# Patient Record
Sex: Male | Born: 1975 | Race: White | Hispanic: No | Marital: Married | Smoking: Never smoker
Health system: Southern US, Community
[De-identification: ages and names within clinical notes are randomized; demographics above are authoritative.]

## PROBLEM LIST (undated history)

## (undated) DIAGNOSIS — K219 Gastro-esophageal reflux disease without esophagitis: Secondary | ICD-10-CM

## (undated) DIAGNOSIS — I509 Heart failure, unspecified: Secondary | ICD-10-CM

## (undated) DIAGNOSIS — J189 Pneumonia, unspecified organism: Secondary | ICD-10-CM

## (undated) DIAGNOSIS — A4902 Methicillin resistant Staphylococcus aureus infection, unspecified site: Secondary | ICD-10-CM

## (undated) DIAGNOSIS — I10 Essential (primary) hypertension: Secondary | ICD-10-CM

## (undated) DIAGNOSIS — F419 Anxiety disorder, unspecified: Secondary | ICD-10-CM

## (undated) DIAGNOSIS — N189 Chronic kidney disease, unspecified: Secondary | ICD-10-CM

## (undated) HISTORY — PX: APPENDECTOMY: SHX54

## (undated) HISTORY — DX: Heart failure, unspecified: I50.9

## (undated) HISTORY — PX: COLONOSCOPY: SHX174

## (undated) HISTORY — PX: HEART TRANSPLANT: SHX268

## (undated) HISTORY — DX: Anxiety disorder, unspecified: F41.9

---

## 2004-08-31 ENCOUNTER — Emergency Department (HOSPITAL_COMMUNITY): Admission: EM | Admit: 2004-08-31 | Discharge: 2004-09-01 | Payer: Self-pay | Admitting: Emergency Medicine

## 2004-08-31 IMAGING — US US ABDOMEN COMPLETE
1 series · 14 of 25 positions shown · non-contrast
Comparison: none

CLINICAL DATA: Rt sided abdominal pain.
 ABDOMINAL ULTRASOUND
 There is no evidence of gallstones or gallbladder wall thickening.  There is no evidence of biliary ductal dilatation with the common bile duct measuring approximately 4 mm.  The liver is normal in echogenicity and no focal liver lesions are seen.  Visualization of the inferior vena cava and pancreas were limited due to overlying bowel gas.  
 There is no evidence of splenomegaly.  Both kidneys are normal in size and appearance and there is no evidence of hydronephrosis.  The abdominal aorta was also obscured by overlying bowel gas. 
 IMPRESSION
 No sonographic abnormality detected.  No evidence of gallstones or biliary dilatation.

[Series 1: unknown · 0.34mm/px · 14 of 56 slices shown]
[im 1/56]
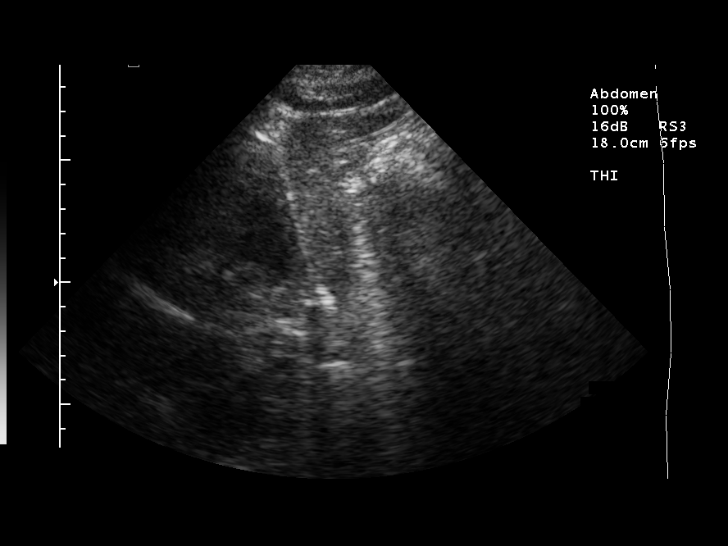
[im 5/56]
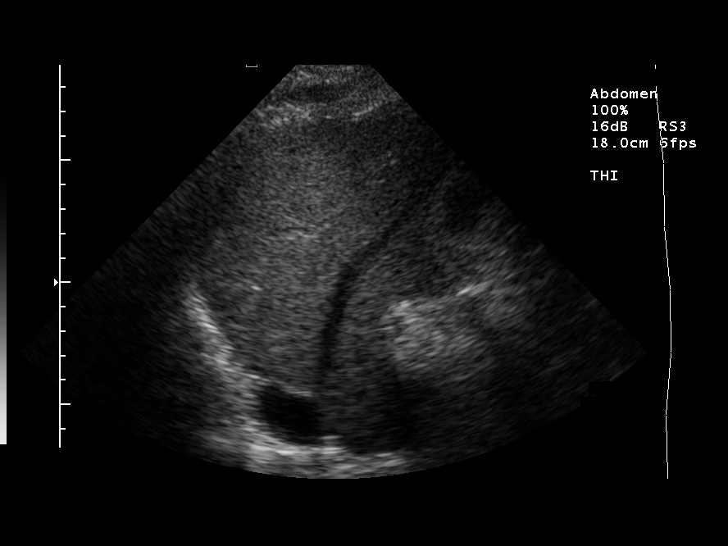
[im 10/56]
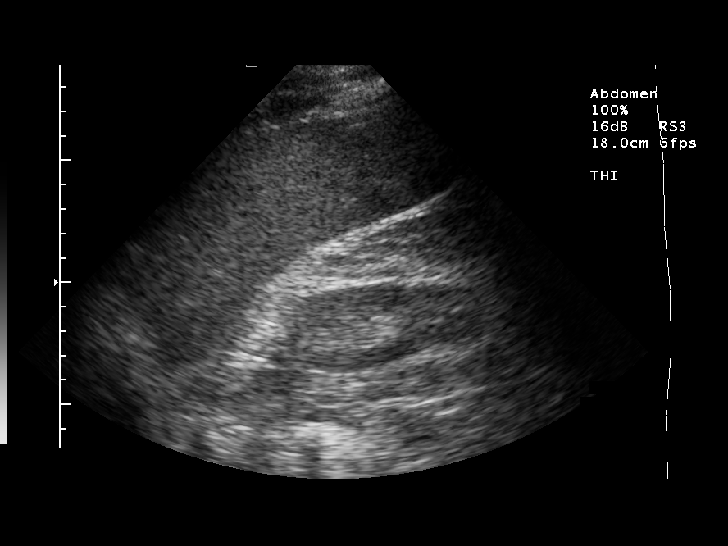
[im 14/56]
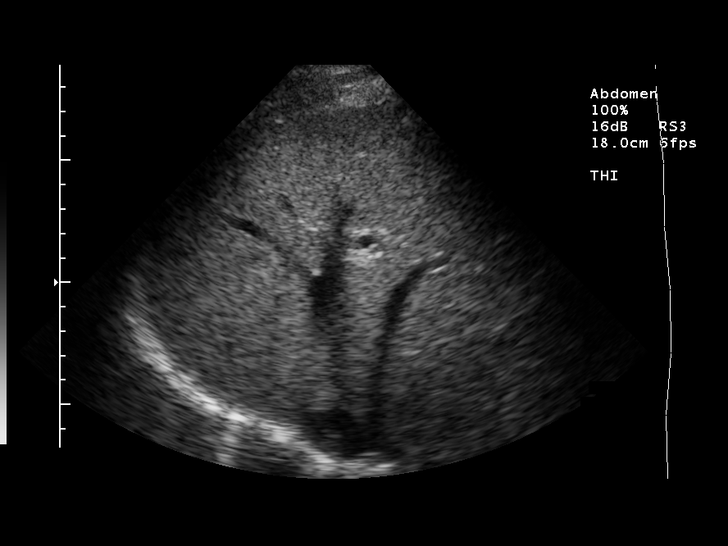
[im 19/56]
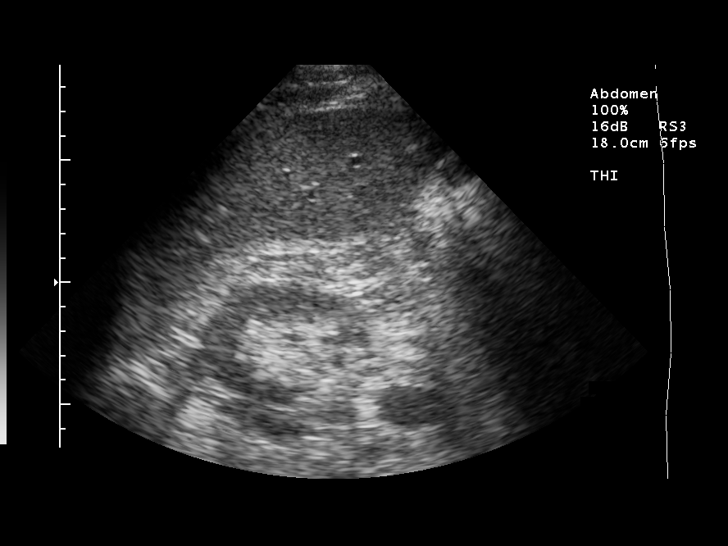
[im 21/56]
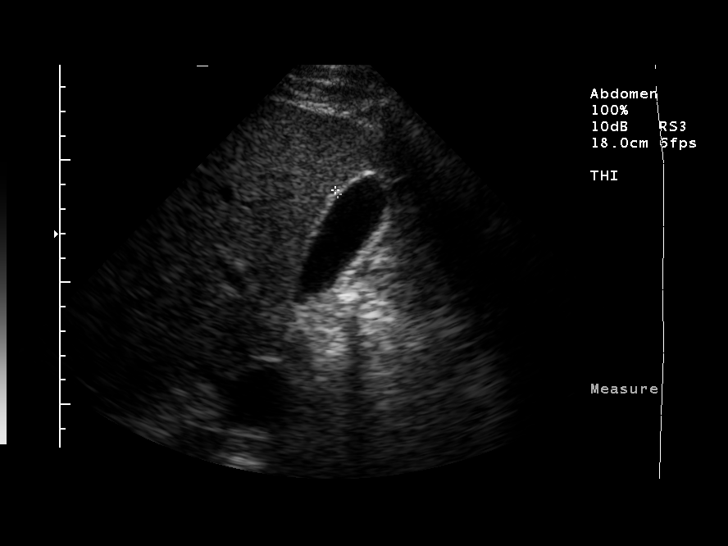
[im 26/56]
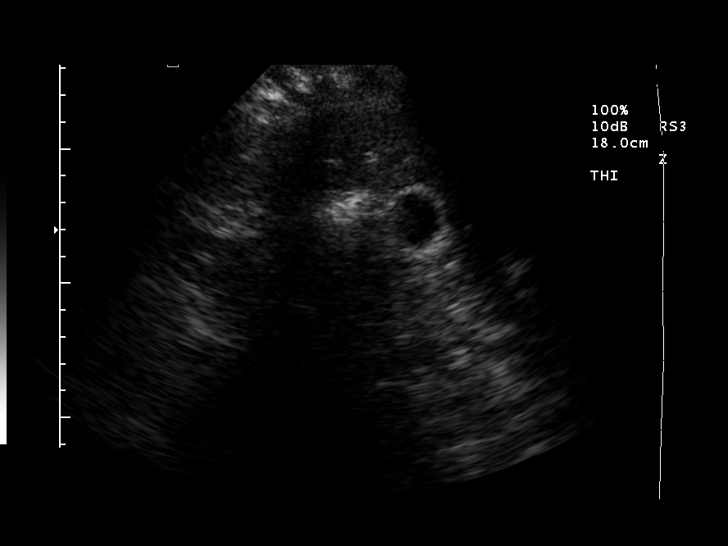
[im 30/56]
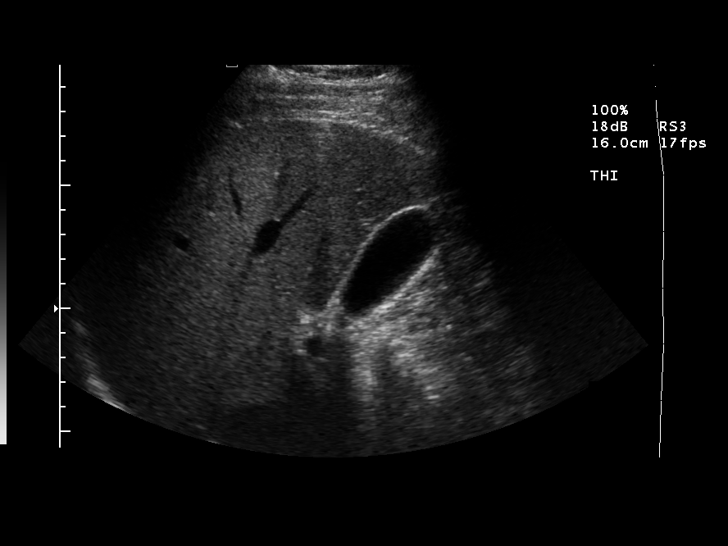
[im 35/56]
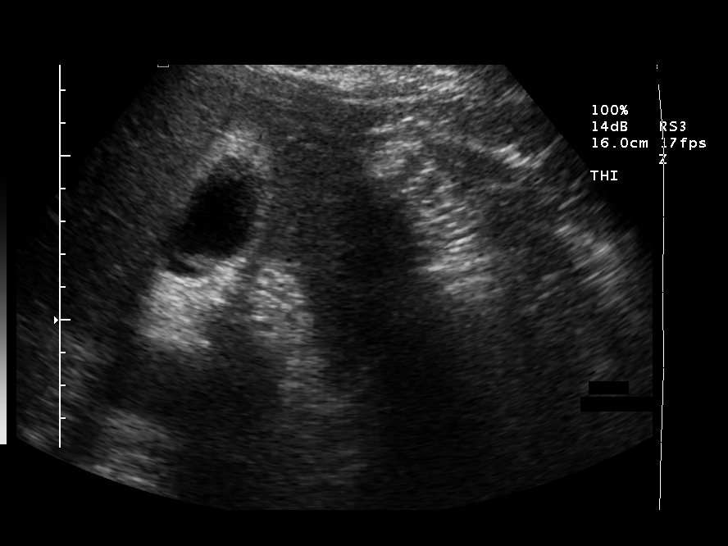
[im 37/56]
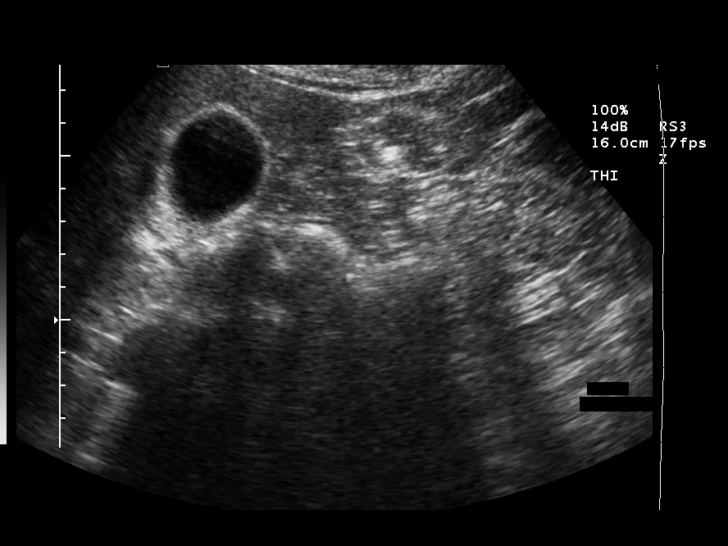
[im 42/56]
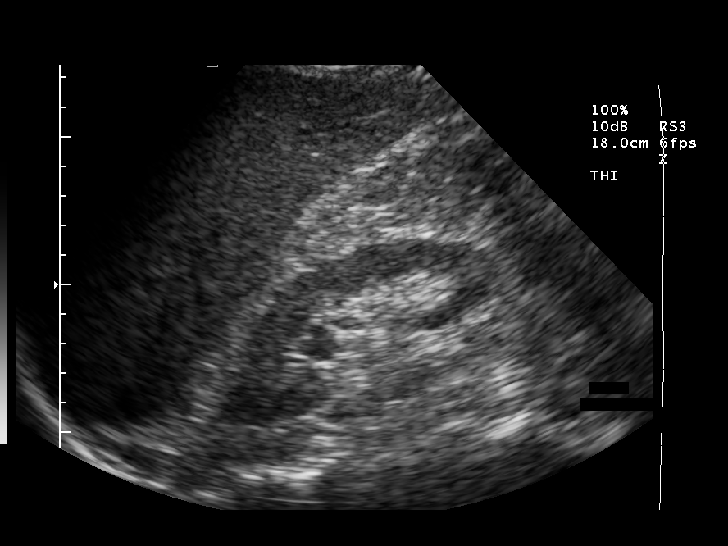
[im 46/56]
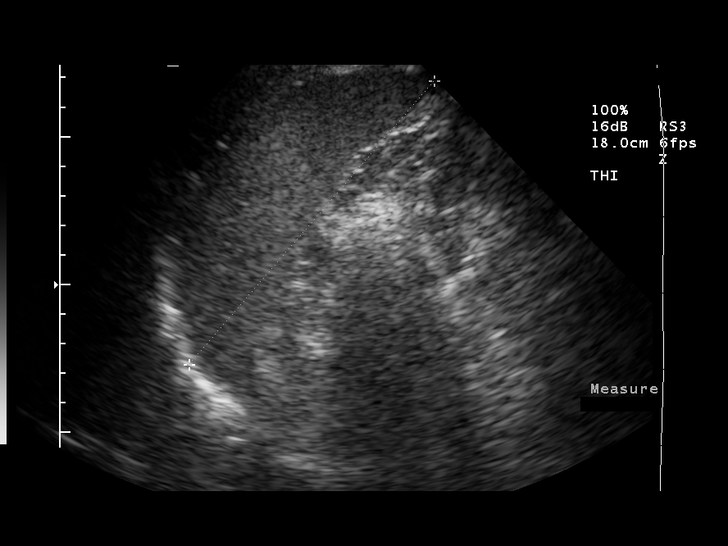
[im 51/56]
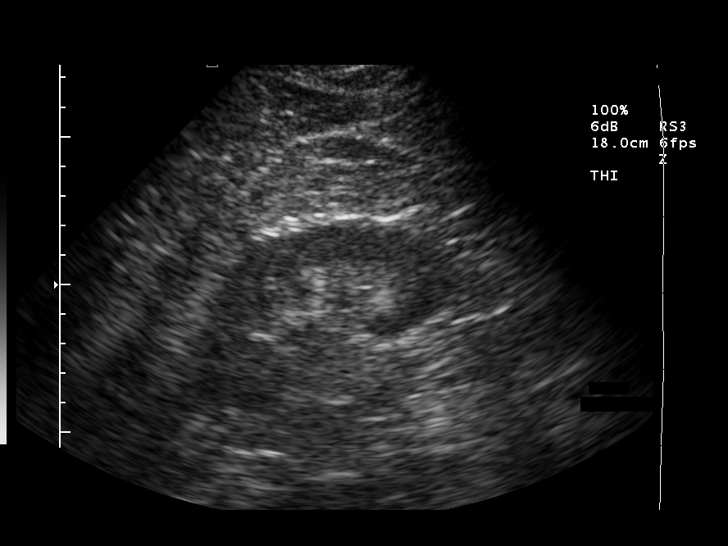
[im 56/56]
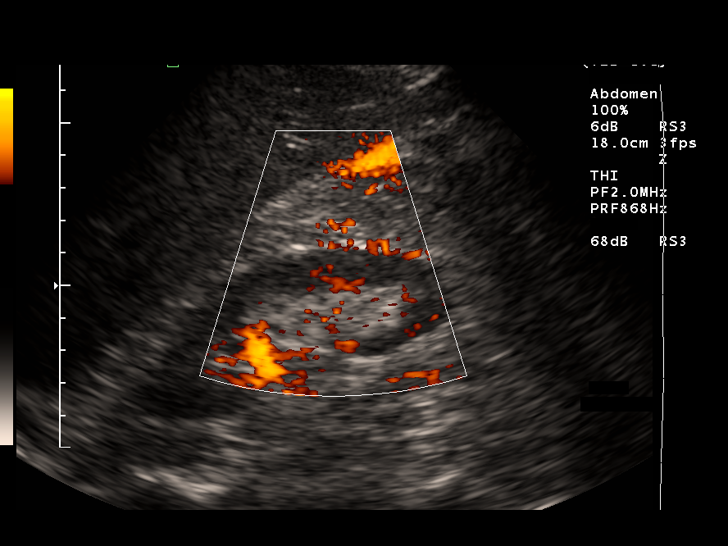

[14 of 25 positions shown; findings below may reference images not displayed]

## 2004-08-31 IMAGING — CR DG CHEST 2V
2 series · 2 of 2 positions shown · non-contrast
Comparison: none

CLINICAL DATA: Chest pain.  Shortness of breath.
 TWO VIEW CHEST, [DATE]
 There are no prior studies available for comparison.  There is cardiomegaly present.  There are perihilar and bibasilar interstitial edematous changes and there is accentuation of the minor fissure.  There is an implantable defibrillator present with a right ventricular lead, which appears in satisfactory position.  
 IMPRESSION
 Cardiomegaly with mild interstitial pulmonary edematous changes.

[view not recorded (1 of 2)]
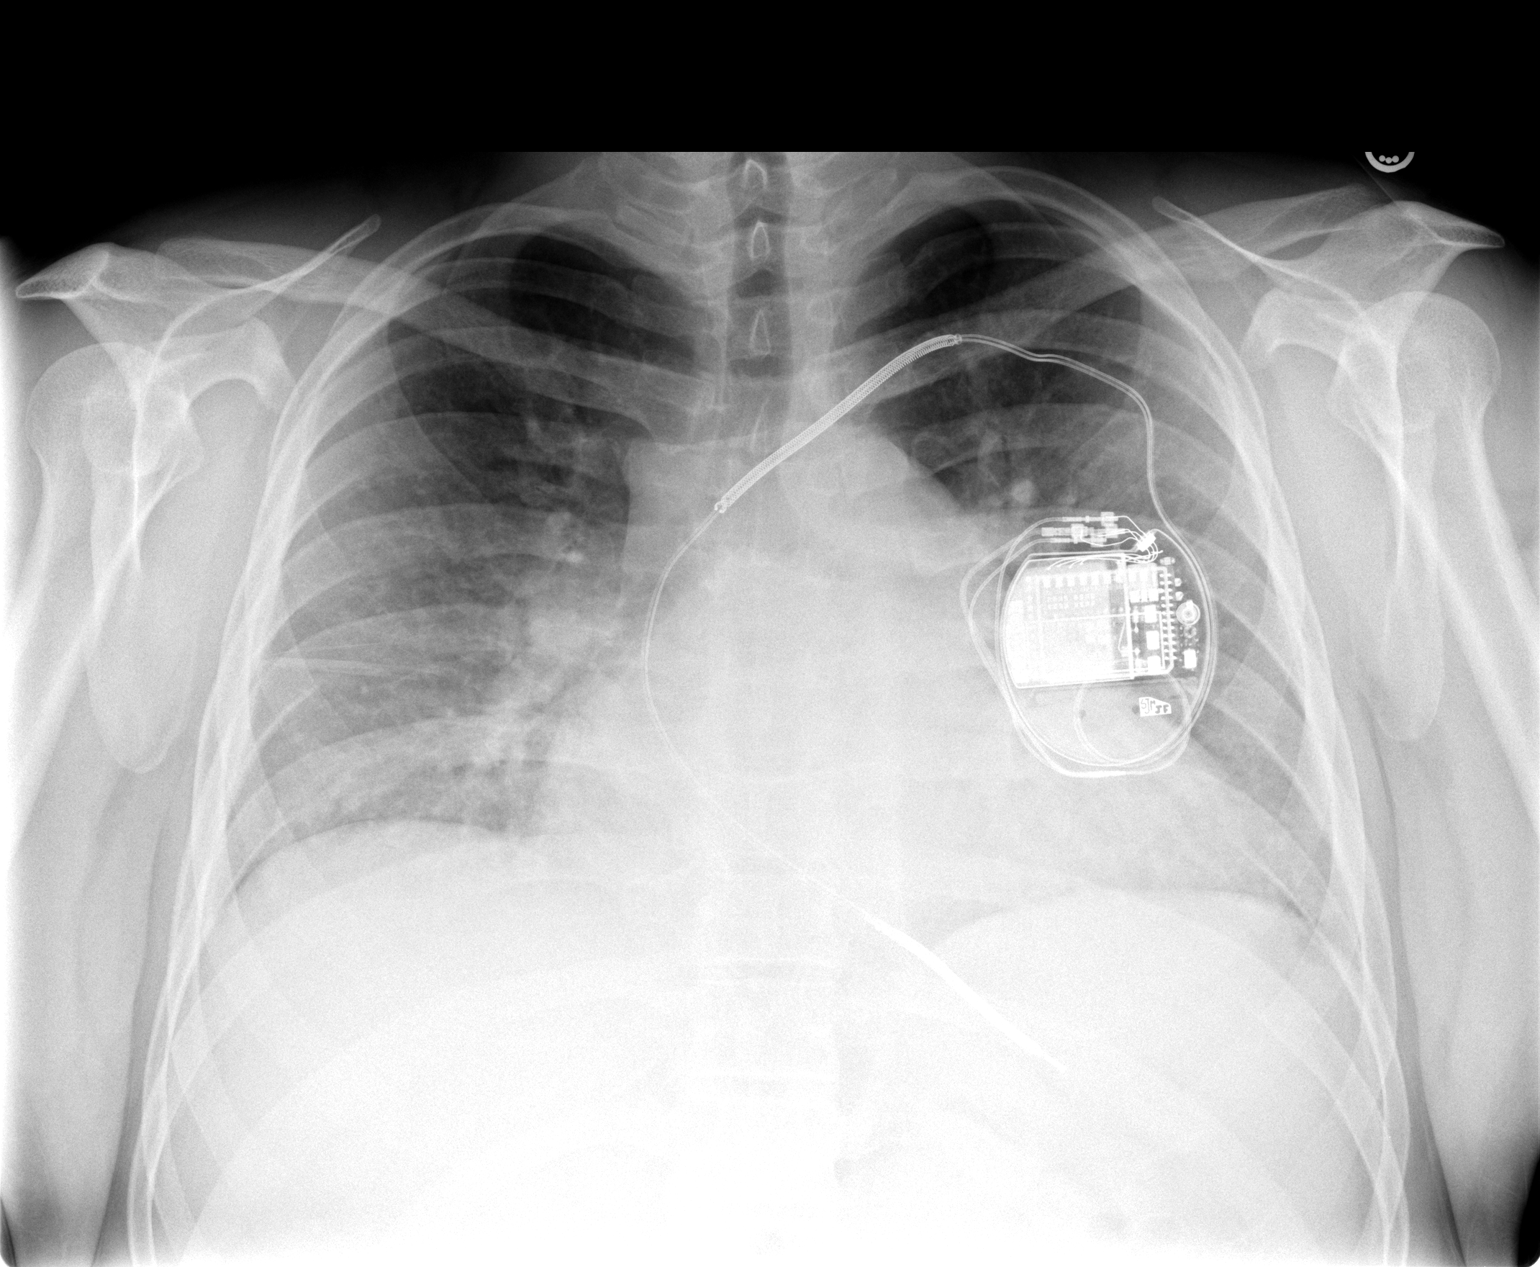

[view not recorded (2 of 2)]
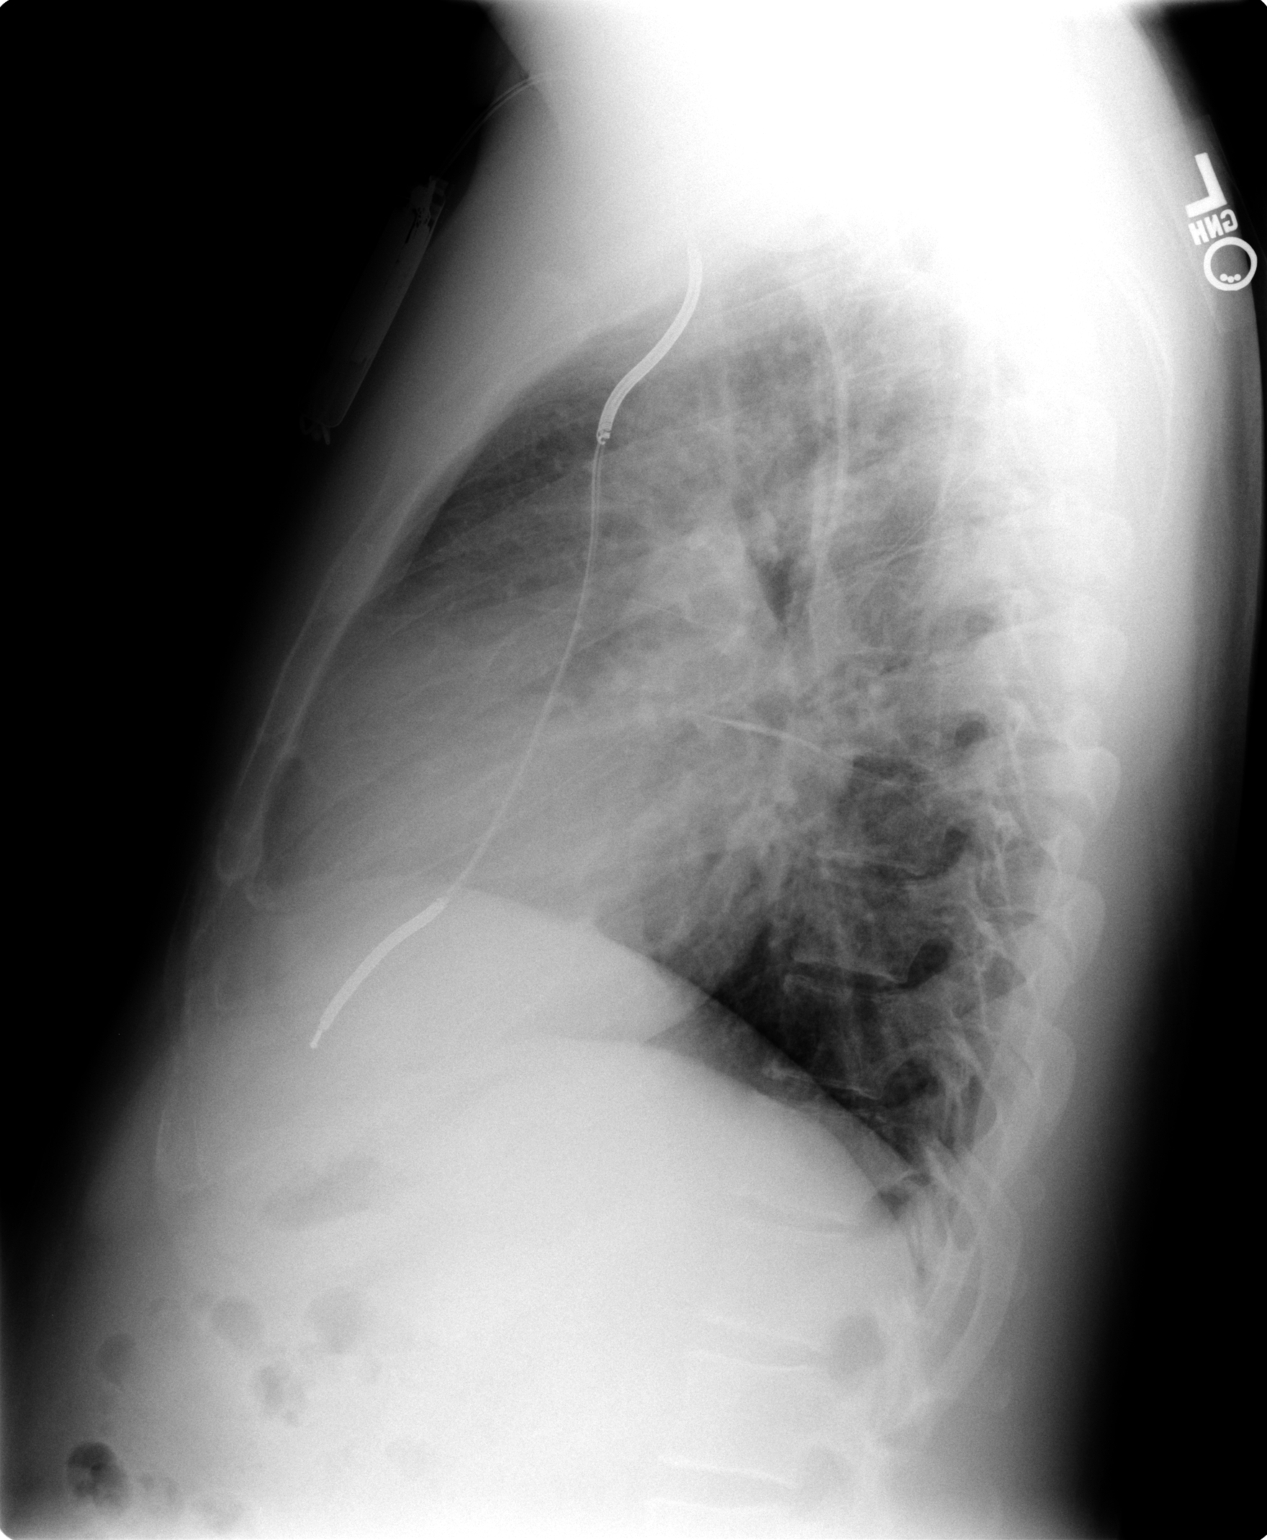

[2 of 2 positions shown; findings below may reference images not displayed]

## 2006-02-10 ENCOUNTER — Encounter: Admission: RE | Admit: 2006-02-10 | Discharge: 2006-02-10 | Payer: Self-pay | Admitting: Sports Medicine

## 2006-02-10 IMAGING — CT CT L SPINE W/O CM
3 series · 16 of 33 positions shown, 19 images · IV contrast (agent unspecified)
Comparison: none

CLINICAL DATA: Chronic low back pain.  No injury. 
LUMBAR SPINE CT WITHOUT CONTRAST:
TECHNIQUE: Multidetector CT imaging of the lumbar spine was performed.  Sagittal and coronal CT image reconstructions were also generated.

[Series 4: recon 3: l-spine helical · axial · 0.27mm/px · z∈[-97,+62]mm · 8 of 151 slices shown, 10 images]
[im 12/151  soft-tissue]
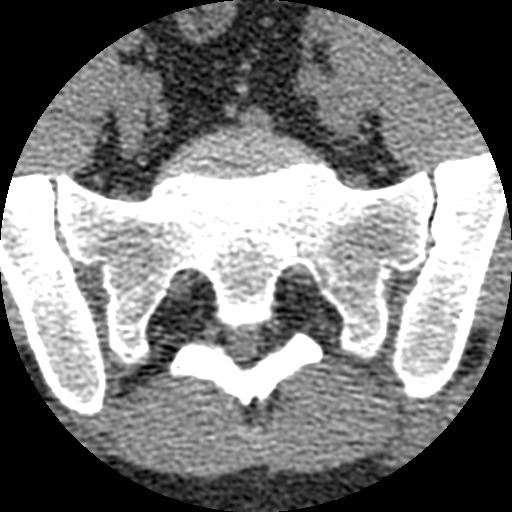
[im 12/151  bone]
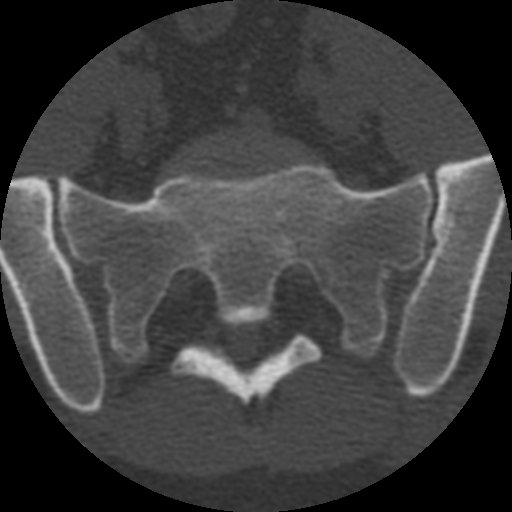
[im 35/151  bone]
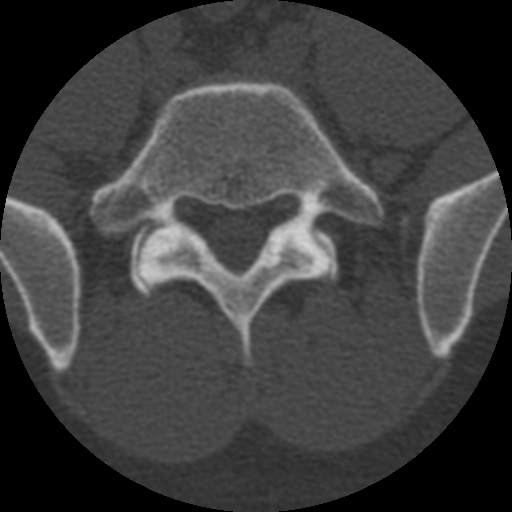
[im 47/151  bone]
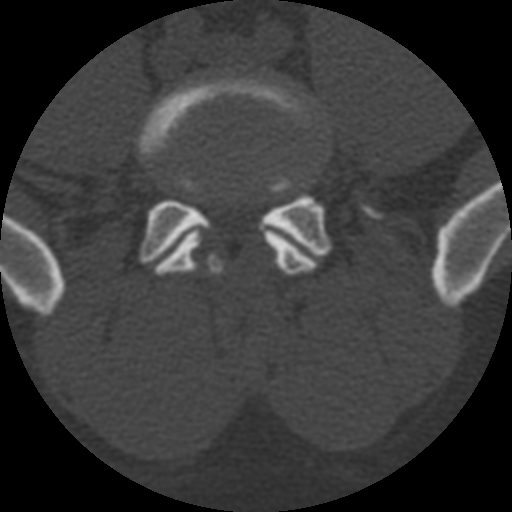
[im 70/151  bone]
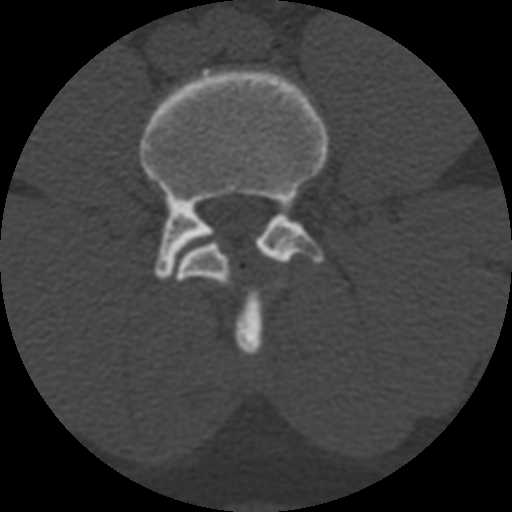
[im 81/151  soft-tissue]
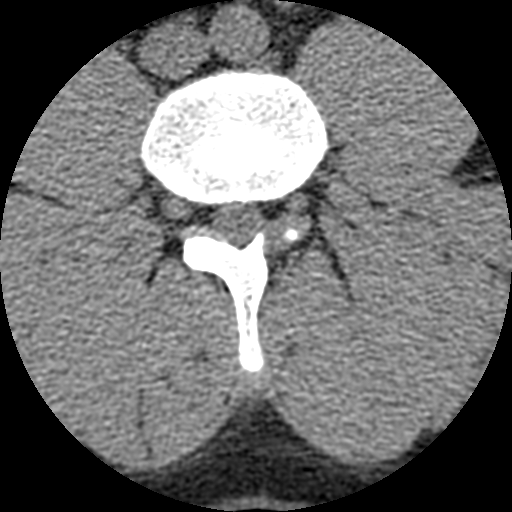
[im 81/151  bone]
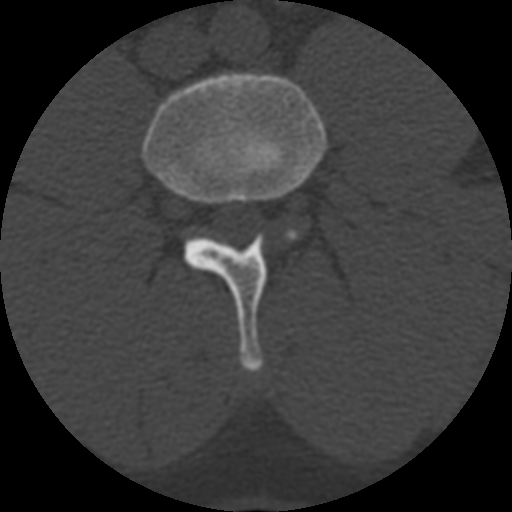
[im 104/151  bone]
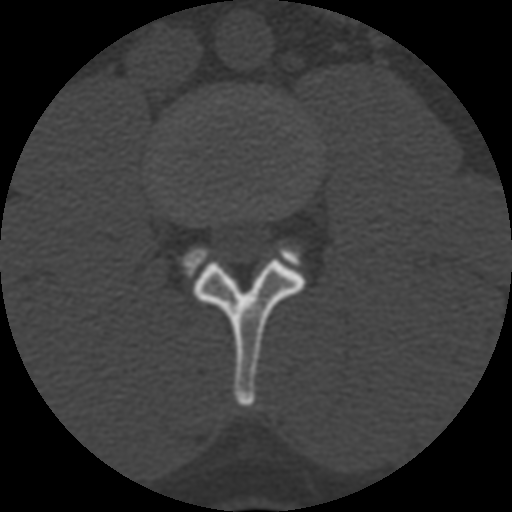
[im 116/151  bone]
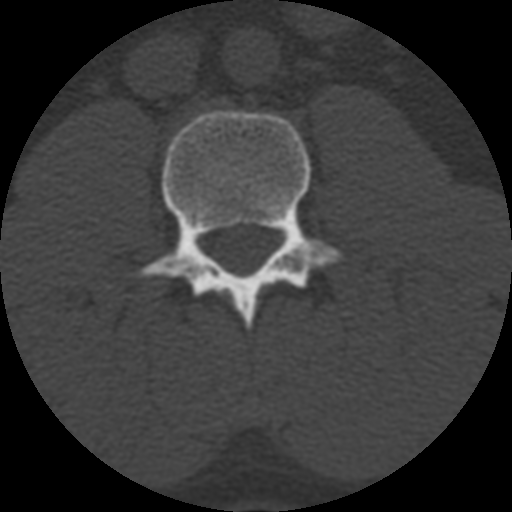
[im 139/151  bone]
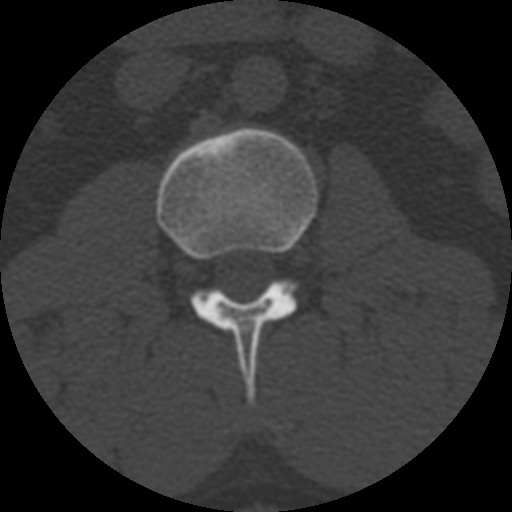

[Series 400: reformatted · sagittal · 0.38mm/px · 5 of 40 slices shown, 6 images (1 of 2)]
[im 14/40  bone]
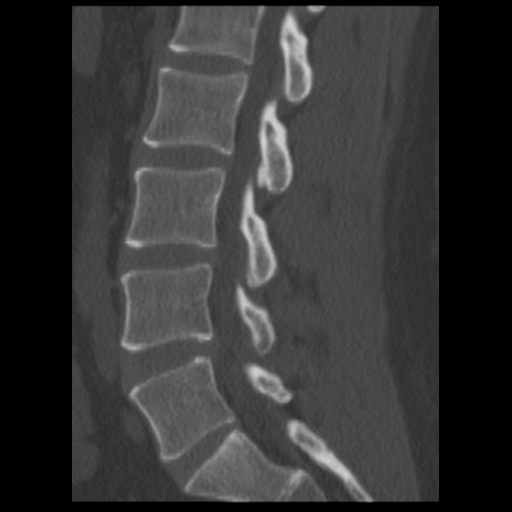
[im 17/40  bone]
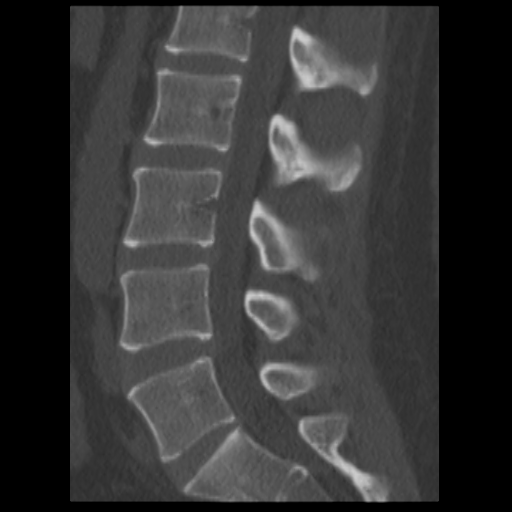
[im 20/40  soft-tissue]
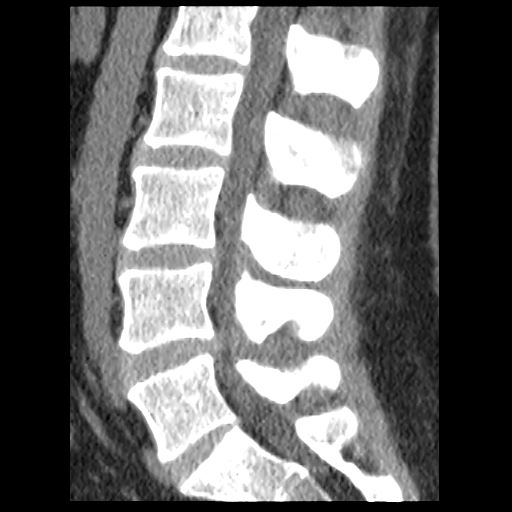
[im 20/40  bone]
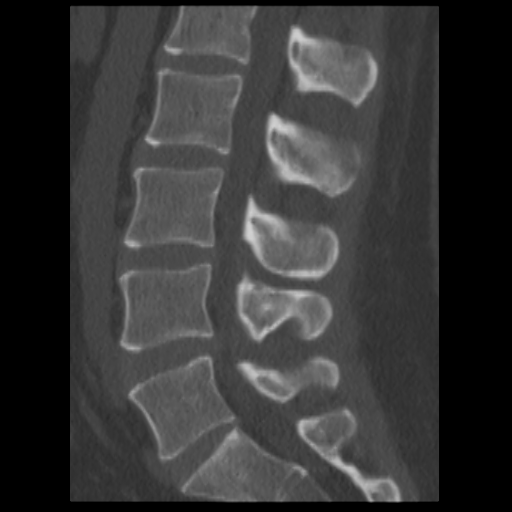
[im 23/40  bone]
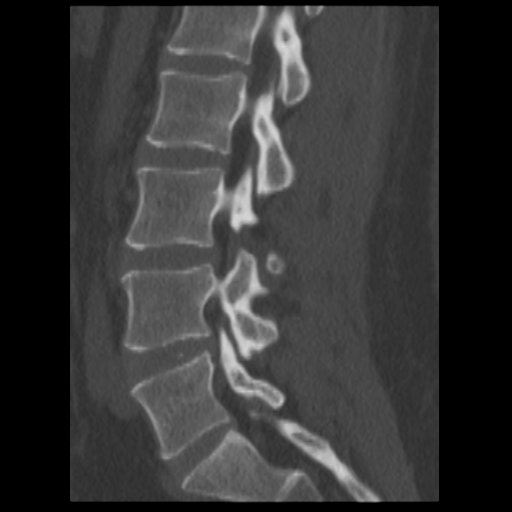
[im 27/40  bone]
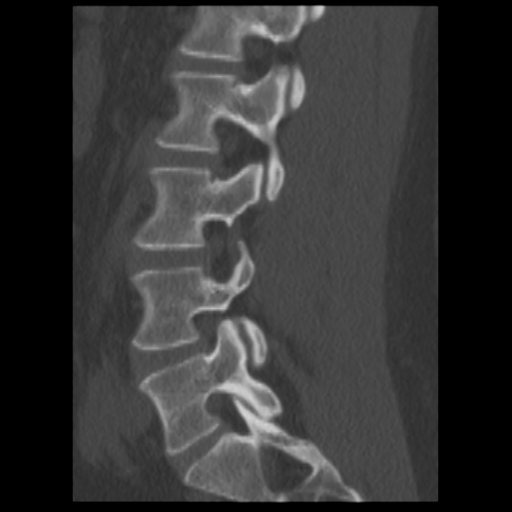

[Series 401: reformatted · coronal · 0.38mm/px · 3 of 40 slices shown (2 of 2)]
[im 8/40  bone]
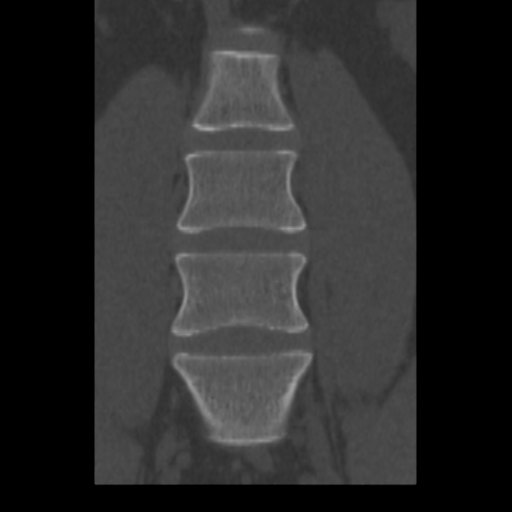
[im 16/40  bone]
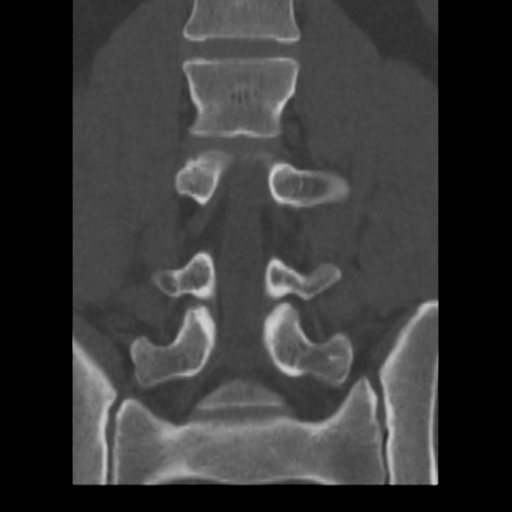
[im 24/40  bone]
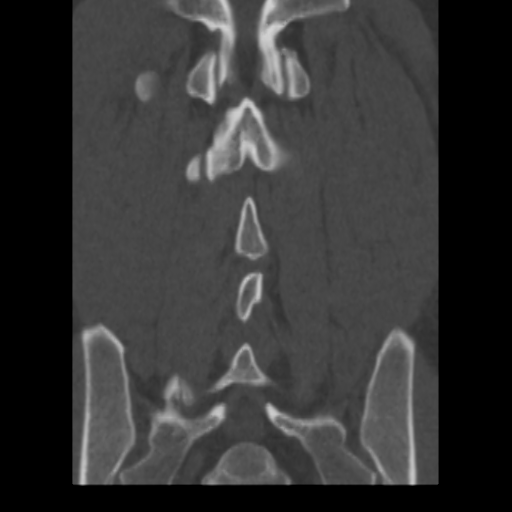

[16 of 33 positions shown; findings below may reference images not displayed]

FINDINGS: At L1-2 the thecal sac is well seen as are the exiting nerve roots with no abnormality. 
At L2-3 no abnormality is seen.
At L3-4 the annulus is minimally bulging but no significant abnormality is evident.  
At L4-5 there is moderate to severe central spinal stenosis which is multifactorial.  There is annular bulging as well as degenerative change of the facet joints and ligamentum flavum hypertrophy.  
At L5-S1 no significant abnormality is seen.  Normal alignment is maintained and intervertebral disc spaces are normal.
IMPRESSION: 1.  Moderate central and lateral recess stenosis at L4-5 which is multifactorial.
2.  Normal alignment with normal disc spaces.

## 2006-12-09 ENCOUNTER — Ambulatory Visit: Payer: Self-pay | Admitting: Gastroenterology

## 2006-12-14 DIAGNOSIS — Z941 Heart transplant status: Secondary | ICD-10-CM

## 2006-12-14 HISTORY — PX: OTHER SURGICAL HISTORY: SHX169

## 2006-12-14 HISTORY — DX: Heart transplant status: Z94.1

## 2007-01-19 ENCOUNTER — Ambulatory Visit: Payer: Self-pay | Admitting: Gastroenterology

## 2009-04-25 ENCOUNTER — Encounter: Payer: Self-pay | Admitting: Orthopedic Surgery

## 2009-05-14 ENCOUNTER — Encounter: Payer: Self-pay | Admitting: Orthopedic Surgery

## 2009-06-13 ENCOUNTER — Encounter: Payer: Self-pay | Admitting: Orthopedic Surgery

## 2011-11-24 DIAGNOSIS — Z941 Heart transplant status: Secondary | ICD-10-CM | POA: Insufficient documentation

## 2011-11-24 DIAGNOSIS — F429 Obsessive-compulsive disorder, unspecified: Secondary | ICD-10-CM | POA: Insufficient documentation

## 2011-11-24 DIAGNOSIS — I10 Essential (primary) hypertension: Secondary | ICD-10-CM | POA: Insufficient documentation

## 2012-12-27 DIAGNOSIS — E785 Hyperlipidemia, unspecified: Secondary | ICD-10-CM | POA: Insufficient documentation

## 2014-02-05 ENCOUNTER — Ambulatory Visit (HOSPITAL_COMMUNITY)
Admission: RE | Admit: 2014-02-05 | Discharge: 2014-02-05 | Disposition: A | Discharge status: Home / Self Care | Payer: 59 | Source: Ambulatory Visit | Attending: Orthopedic Surgery | Admitting: Orthopedic Surgery

## 2014-02-05 ENCOUNTER — Other Ambulatory Visit (HOSPITAL_COMMUNITY): Payer: Self-pay | Admitting: Orthopedic Surgery

## 2014-02-05 DIAGNOSIS — R52 Pain, unspecified: Secondary | ICD-10-CM

## 2014-02-05 DIAGNOSIS — Z1389 Encounter for screening for other disorder: Secondary | ICD-10-CM | POA: Insufficient documentation
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
## 2018-10-11 ENCOUNTER — Encounter (HOSPITAL_COMMUNITY): Payer: Self-pay | Admitting: Emergency Medicine

## 2018-10-11 ENCOUNTER — Emergency Department (HOSPITAL_COMMUNITY)
Admission: EM | Admit: 2018-10-11 | Discharge: 2018-10-11 | Disposition: A | Discharge status: Home / Self Care | Payer: No Typology Code available for payment source | Attending: Emergency Medicine | Admitting: Emergency Medicine

## 2018-10-11 ENCOUNTER — Emergency Department (HOSPITAL_COMMUNITY): Payer: No Typology Code available for payment source

## 2018-10-11 DIAGNOSIS — S81802A Unspecified open wound, left lower leg, initial encounter: Secondary | ICD-10-CM | POA: Diagnosis not present

## 2018-10-11 DIAGNOSIS — Y92410 Unspecified street and highway as the place of occurrence of the external cause: Secondary | ICD-10-CM | POA: Insufficient documentation

## 2018-10-11 DIAGNOSIS — Y990 Civilian activity done for income or pay: Secondary | ICD-10-CM | POA: Diagnosis not present

## 2018-10-11 DIAGNOSIS — M25511 Pain in right shoulder: Secondary | ICD-10-CM | POA: Diagnosis not present

## 2018-10-11 DIAGNOSIS — Y9389 Activity, other specified: Secondary | ICD-10-CM | POA: Insufficient documentation

## 2018-10-11 DIAGNOSIS — S8992XA Unspecified injury of left lower leg, initial encounter: Secondary | ICD-10-CM | POA: Diagnosis present
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
## 2018-10-11 NOTE — ED Notes (Signed)
Patient transported to X-ray 

## 2018-10-11 NOTE — ED Notes (Signed)
Family at bedside. 

## 2018-10-11 NOTE — ED Notes (Signed)
ED Provider at bedside. 
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
## 2019-01-03 DIAGNOSIS — H906 Mixed conductive and sensorineural hearing loss, bilateral: Secondary | ICD-10-CM | POA: Insufficient documentation

## 2019-09-28 DIAGNOSIS — H6123 Impacted cerumen, bilateral: Secondary | ICD-10-CM | POA: Insufficient documentation

## 2019-12-01 ENCOUNTER — Ambulatory Visit: Payer: Managed Care, Other (non HMO) | Attending: Internal Medicine

## 2019-12-01 DIAGNOSIS — Z20822 Contact with and (suspected) exposure to covid-19: Secondary | ICD-10-CM

## 2019-12-02 LAB — NOVEL CORONAVIRUS, NAA: SARS-CoV-2, NAA: NOT DETECTED

## 2020-04-01 ENCOUNTER — Encounter (HOSPITAL_COMMUNITY): Payer: Self-pay | Admitting: Emergency Medicine

## 2020-04-01 ENCOUNTER — Other Ambulatory Visit: Payer: Self-pay

## 2020-04-01 ENCOUNTER — Emergency Department (HOSPITAL_COMMUNITY)
Admission: EM | Admit: 2020-04-01 | Discharge: 2020-04-01 | Disposition: A | Discharge status: Home / Self Care | Payer: Managed Care, Other (non HMO) | Attending: Emergency Medicine | Admitting: Emergency Medicine

## 2020-04-01 ENCOUNTER — Emergency Department (HOSPITAL_COMMUNITY): Payer: Managed Care, Other (non HMO)

## 2020-04-01 DIAGNOSIS — R1033 Periumbilical pain: Secondary | ICD-10-CM | POA: Insufficient documentation

## 2020-04-01 DIAGNOSIS — R1013 Epigastric pain: Secondary | ICD-10-CM | POA: Insufficient documentation

## 2020-04-01 DIAGNOSIS — R112 Nausea with vomiting, unspecified: Secondary | ICD-10-CM | POA: Diagnosis present

## 2020-04-01 DIAGNOSIS — R109 Unspecified abdominal pain: Secondary | ICD-10-CM

## 2020-04-01 DIAGNOSIS — R197 Diarrhea, unspecified: Secondary | ICD-10-CM | POA: Diagnosis not present

## 2020-04-01 LAB — COMPREHENSIVE METABOLIC PANEL
ALT: 25 U/L (ref 0–44)
AST: 33 U/L (ref 15–41)
Albumin: 4.2 g/dL (ref 3.5–5.0)
Alkaline Phosphatase: 59 U/L (ref 38–126)
Anion gap: 13 (ref 5–15)
BUN: 18 mg/dL (ref 6–20)
CO2: 22 mmol/L (ref 22–32)
Calcium: 9.9 mg/dL (ref 8.9–10.3)
Chloride: 104 mmol/L (ref 98–111)
Creatinine, Ser: 1.26 mg/dL — ABNORMAL HIGH (ref 0.61–1.24)
GFR calc Af Amer: >60 mL/min (ref >60)
GFR calc non Af Amer: >60 mL/min (ref >60)
Glucose, Bld: 124 mg/dL — ABNORMAL HIGH (ref 70–99)
Potassium: 3.9 mmol/L (ref 3.5–5.1)
Sodium: 139 mmol/L (ref 135–145)
Total Bilirubin: 1.3 mg/dL — ABNORMAL HIGH (ref 0.3–1.2)
Total Protein: 7 g/dL (ref 6.5–8.1)

## 2020-04-01 LAB — LIPASE, BLOOD: Lipase: 22 U/L (ref 11–51)

## 2020-04-01 LAB — URINALYSIS, ROUTINE W REFLEX MICROSCOPIC
Bilirubin Urine: NEGATIVE
Glucose, UA: NEGATIVE mg/dL
Hgb urine dipstick: NEGATIVE
Ketones, ur: NEGATIVE mg/dL
Leukocytes,Ua: NEGATIVE
Nitrite: NEGATIVE
Protein, ur: NEGATIVE mg/dL
Specific Gravity, Urine: 1.021 (ref 1.005–1.030)
pH: 7 (ref 5.0–8.0)

## 2020-04-01 LAB — CBC WITH DIFFERENTIAL/PLATELET
Abs Immature Granulocytes: 0.04 10*3/uL (ref 0.00–0.07)
Basophils Absolute: 0 10*3/uL (ref 0.0–0.1)
Basophils Relative: 0 %
Eosinophils Absolute: 0 10*3/uL (ref 0.0–0.5)
Eosinophils Relative: 0 %
HCT: 43 % (ref 39.0–52.0)
Hemoglobin: 14.5 g/dL (ref 13.0–17.0)
Immature Granulocytes: 0 %
Lymphocytes Relative: 11 %
Lymphs Abs: 1.1 10*3/uL (ref 0.7–4.0)
MCH: 29.6 pg (ref 26.0–34.0)
MCHC: 33.7 g/dL (ref 30.0–36.0)
MCV: 87.8 fL (ref 80.0–100.0)
Monocytes Absolute: 0.6 10*3/uL (ref 0.1–1.0)
Monocytes Relative: 6 %
Neutro Abs: 8.4 10*3/uL — ABNORMAL HIGH (ref 1.7–7.7)
Neutrophils Relative %: 83 %
Platelets: 241 10*3/uL (ref 150–400)
RBC: 4.9 MIL/uL (ref 4.22–5.81)
RDW: 12.4 % (ref 11.5–15.5)
WBC: 10.2 10*3/uL (ref 4.0–10.5)
nRBC: 0 % (ref 0.0–0.2)

## 2020-04-01 LAB — C DIFFICILE QUICK SCREEN W PCR REFLEX
C Diff antigen: NEGATIVE
C Diff interpretation: NOT DETECTED
C Diff toxin: NEGATIVE
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
## 2020-04-01 MED ORDER — CIPROFLOXACIN HCL 500 MG PO TABS
500.0000 mg | ORAL_TABLET | Freq: Once | ORAL | Status: AC
  Administered 2020-04-01: 500 mg via ORAL
  Filled 2020-04-01: qty 1

## 2020-04-01 MED ORDER — ONDANSETRON 4 MG PO TBDP: 4.0000 mg | ORAL_TABLET | Freq: Three times a day (TID) | ORAL | 0 refills | Status: DC | PRN

## 2020-04-01 MED ORDER — MORPHINE SULFATE (PF) 4 MG/ML IV SOLN
4.0000 mg | Freq: Once | INTRAVENOUS | Status: AC
  Administered 2020-04-01: 21:00:00 4 mg via INTRAVENOUS
  Filled 2020-04-01: qty 1

## 2020-04-01 MED ORDER — CIPROFLOXACIN HCL 500 MG PO TABS: 500.0000 mg | ORAL_TABLET | Freq: Two times a day (BID) | ORAL | 0 refills | Status: DC

## 2020-04-01 MED ORDER — IOHEXOL 300 MG/ML  SOLN
100.0000 mL | Freq: Once | INTRAMUSCULAR | Status: AC | PRN
  Administered 2020-04-01: 100 mL via INTRAVENOUS

## 2020-04-01 MED ORDER — LACTATED RINGERS IV BOLUS
1000.0000 mL | Freq: Once | INTRAVENOUS | Status: AC
  Administered 2020-04-01: 18:00:00 1000 mL via INTRAVENOUS

## 2020-04-01 MED ORDER — LACTATED RINGERS IV BOLUS
1000.0000 mL | Freq: Once | INTRAVENOUS | Status: AC
  Administered 2020-04-01: 1000 mL via INTRAVENOUS

## 2020-04-01 MED ORDER — MORPHINE SULFATE (PF) 4 MG/ML IV SOLN
4.0000 mg | Freq: Once | INTRAVENOUS | Status: AC
  Administered 2020-04-01: 18:00:00 4 mg via INTRAVENOUS
  Filled 2020-04-01: qty 1

## 2020-04-01 MED ORDER — HYDROCODONE-ACETAMINOPHEN 5-325 MG PO TABS: 1.0000 | ORAL_TABLET | ORAL | 0 refills | Status: DC | PRN
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
## 2020-04-01 NOTE — Discharge Instructions (Signed)
Take Cipro twice daily for 5 days Take Zofran as needed for nausea/vomiting Take Norco as needed for abdominal pain Please follow up with your doctor tomorrow Return to the ED if you are worsening

## 2020-04-01 NOTE — ED Provider Notes (Signed)
[facility] EMERGENCY DEPARTMENT Provider Note   CSN: [id number] Arrival date & time: 04/01/20  1711     History Chief Complaint  Patient presents with  . Nausea  . Vomiting  . Diarrhea    Shawn Calderon is a 44 y.o. male with remote hx of heart transplant followed at [facility] who presents with abdominal pain, N/V/D. His wife is at bedside and helps with history. About 1 week ago he started to develop abdominal pain. It is in the periumbilical region and epigastric area. It feels like cramping, squeezing, bloating. Also felt like acid reflux at time with a burning in the epigastrium. He has had pain before which was attributed to anxiety - in that past he would take a Klonipin or Xanax and it would relax him and he would vomit and then get better within a day. This time he tried that without relief. Symptoms have been persistent for one week and today symptoms "escalated" and he started to have N/V/D. His wife was driving him here and while he was having vomiting and heavy breathing his arms suddenly went numb so she pulled over to a fire department and he was transported by EMS. N/V is better after getting Zofran. Numbness is resolved. He has had 3 episodes of diarrhea since being in the ED and he also notes that stool has been dark red. He denies any recent antibiotics. He is on immunosuppressants. No hx of GI work up. Pt has had an appendectomy.   HPI     No past medical history on file.  There are no problems to display for this patient.   No past surgical history on file.     No family history on file.  Social History   Tobacco Use  . Smoking status: Never Smoker  . Smokeless tobacco: Never Used  Substance Use Topics  . Alcohol use: Not on file  . Drug use: Not on file    Home Medications Prior to Admission medications   Not on File    Allergies    Patient has no allergy information on record.  Review of Systems   Review of Systems    Constitutional: Negative for chills and fever.  Respiratory: Negative for shortness of breath.   Cardiovascular: Negative for chest pain.  Gastrointestinal: Positive for abdominal pain, diarrhea, nausea and vomiting.  Allergic/Immunologic: Positive for immunocompromised state.  Neurological: Positive for numbness.  All other systems reviewed and are negative.   Physical Exam Updated Vital Signs BP (!) 124/111 (BP Location: Right Arm)   Pulse 86   Temp 98.9 F (37.2 C) (Oral)   Resp 12   SpO2 99%   Physical Exam Vitals and nursing note reviewed.  Constitutional:      General: He is in acute distress.     Appearance: Normal appearance. He is well-developed. He is not ill-appearing.     Comments: Uncomfortable appearing. Cooperative  HENT:     Head: Normocephalic and atraumatic.  Eyes:     General: No scleral icterus.       Right eye: No discharge.        Left eye: No discharge.     Conjunctiva/sclera: Conjunctivae normal.     Pupils: Pupils are equal, round, and reactive to light.  Cardiovascular:     Rate and Rhythm: Normal rate and regular rhythm.  Pulmonary:     Effort: Pulmonary effort is normal. No respiratory distress.     Breath sounds: Normal breath sounds.  Abdominal:     General: There is no distension.     Palpations: Abdomen is soft.     Tenderness: There is abdominal tenderness (generalized).  Musculoskeletal:     Cervical back: Normal range of motion.  Skin:    General: Skin is warm and dry.  Neurological:     Mental Status: He is alert and oriented to person, place, and time.  Psychiatric:        Behavior: Behavior normal.     ED Results / Procedures / Treatments   Labs (all labs ordered are listed, but only abnormal results are displayed) Labs Reviewed  CBC WITH DIFFERENTIAL/PLATELET - Abnormal; Notable for the following components:      Result Value   Neutro Abs 8.4 (*)    All other components within normal limits  COMPREHENSIVE METABOLIC  PANEL - Abnormal; Notable for the following components:   Glucose, Bld 124 (*)    Creatinine, Ser 1.26 (*)    Total Bilirubin 1.3 (*)    All other components within normal limits  URINALYSIS, ROUTINE W REFLEX MICROSCOPIC - Abnormal; Notable for the following components:   Color, Urine STRAW (*)    All other components within normal limits  C DIFFICILE QUICK SCREEN W PCR REFLEX  GASTROINTESTINAL PANEL BY PCR, STOOL (REPLACES STOOL CULTURE)  LIPASE, BLOOD    EKG EKG Interpretation  Date/Time:  Monday April 01 2020 17:31:50 EDT Ventricular Rate:  86 PR Interval:    QRS Duration: 96 QT Interval:  378 QTC Calculation: 453 R Axis:   41 Text Interpretation: Sinus rhythm Prolonged PR interval Consider right ventricular hypertrophy Abnormal T, consider ischemia, anterior leads ST elevation, consider inferior injury T wave inversions V2-V3 new however last tracing is from 2005, before heart transplant Confirmed by Frederick Peers [id number]) on 04/01/2020 8:34:21 PM   Radiology CT Abdomen Pelvis W Contrast  Result Date: 04/01/2020 CLINICAL DATA:  44 year old male with nausea vomiting and generalized abdominal pain. EXAM: CT ABDOMEN AND PELVIS WITH CONTRAST TECHNIQUE: Multidetector CT imaging of the abdomen and pelvis was performed using the standard protocol following bolus administration of intravenous contrast. CONTRAST:  100 cc Omnipaque 300. COMPARISON:  None. FINDINGS: Lower chest: There is mild cardiomegaly. The visualized lung bases are clear. No intra-abdominal free air or free fluid. Hepatobiliary: No focal liver abnormality is seen. No gallstones, gallbladder wall thickening, or biliary dilatation. Pancreas: Unremarkable. No pancreatic ductal dilatation or surrounding inflammatory changes. Spleen: Normal in size without focal abnormality. Adrenals/Urinary Tract: The adrenal glands are unremarkable. There is no hydronephrosis on either side. There is symmetric enhancement and excretion of  contrast by both kidneys. There is a 5 cm right renal interpolar cyst. The visualized ureters and urinary bladder appear unremarkable. Stomach/Bowel: There is a small hiatal hernia. There is inflammatory changes and thickening of a long segment of small bowel loop in the mid abdomen most consistent with enteritis. An infiltrative process is less likely but not excluded. Clinical correlation is recommended. There is no bowel obstruction. There is extensive colonic diverticulosis. Loose stool throughout the colon compatible with diarrheal state. The appendix is not visualized with certainty. No inflammatory changes identified in the right lower quadrant. Vascular/Lymphatic: The abdominal aorta and IVC unremarkable. No portal venous gas. There is no adenopathy. Reproductive: The prostate and seminal vesicles are grossly unremarkable. Other: None Musculoskeletal: No acute or significant osseous findings. IMPRESSION: 1. Enteritis with diarrheal state. Correlation with clinical exam and stool cultures recommended. No bowel obstruction. 2. Extensive colonic diverticulosis.  Electronically Signed   By: Elgie Collard M.D.   On: 04/01/2020 19:51    Procedures Procedures (including critical care time)  Medications Ordered in ED Medications  lactated ringers bolus 1,000 mL (0 mLs Intravenous Stopped 04/01/20 2100)  morphine 4 MG/ML injection 4 mg (4 mg Intravenous Given 04/01/20 1826)  iohexol (OMNIPAQUE) 300 MG/ML solution 100 mL (100 mLs Intravenous Contrast Given 04/01/20 1949)  lactated ringers bolus 1,000 mL (0 mLs Intravenous Stopped 04/01/20 2203)  morphine 4 MG/ML injection 4 mg (4 mg Intravenous Given 04/01/20 2057)  ciprofloxacin (CIPRO) tablet 500 mg (500 mg Oral Given 04/01/20 2203)    ED Course  I have reviewed the triage vital signs and the nursing notes.  Pertinent labs & imaging results that were available during my care of the patient were reviewed by me and considered in my medical decision  making (see chart for details).  44 year old male presents with abdominal pain, nausea, vomiting, bloody diarrhea.  Abdominal pain has been ongoing for several days however nausea, vomiting, and diarrhea started today. The differential diagnosis includes GI bleed, diverticulitis, infectious diarrhea.  Labs, urine, CT abdomen and pelvis ordered.  Stool studies ordered as well.  Fluids, morphine ordered for symptomatic management.  Labs and urine are overall reassuring.  He has a mild elevation of serum creatinine (1.2).   CT abdomen pelvis is consistent with enteritis.  After the interventions stated above, I reevaluated the patient and found that pain was improved and diarrhea has resolved.  He was able to give [place] a stool sample and C. difficile testing here is negative.  Due to the severity of his diarrhea will treat empirically with antibiotics.  Patient is requesting another fluid bolus and further pain control which was provided and he tolerated p.o. Cipro.   Patient was reevaluated again and he feels much better.  He is ready for discharge home.  Prescription were sent to his pharmacy.  He has a appointment with an internal medicine doctor tomorrow morning was encouraged to keep that appointment for recheck.  MDM Rules/Calculators/A&P                       Final Clinical Impression(s) / ED Diagnoses Final diagnoses:  Nausea vomiting and diarrhea  Abdominal pain, unspecified abdominal location    Rx / DC Orders ED Discharge Orders    None       Bethel Born, PA-C 04/01/20 2224    Little, Ambrose Finland, MD 04/01/20 2306

## 2020-04-01 NOTE — ED Notes (Signed)
Patient verbalizes understanding of discharge instructions. Opportunity for questioning and answers were provided. Armband removed by staff, pt discharged from ED ambulatory.   

## 2020-04-02 ENCOUNTER — Ambulatory Visit: Payer: Managed Care, Other (non HMO) | Admitting: Internal Medicine

## 2020-04-02 ENCOUNTER — Encounter: Payer: Self-pay | Admitting: Internal Medicine

## 2020-04-02 VITALS — BP 112/67 | HR 87 | Temp 98.7°F | Wt 203.8 lb

## 2020-04-02 DIAGNOSIS — R112 Nausea with vomiting, unspecified: Secondary | ICD-10-CM

## 2020-04-02 DIAGNOSIS — R1030 Lower abdominal pain, unspecified: Secondary | ICD-10-CM

## 2020-04-02 DIAGNOSIS — Z941 Heart transplant status: Secondary | ICD-10-CM

## 2020-04-02 DIAGNOSIS — R1110 Vomiting, unspecified: Secondary | ICD-10-CM | POA: Insufficient documentation

## 2020-04-02 LAB — GASTROINTESTINAL PANEL BY PCR, STOOL (REPLACES STOOL CULTURE)

## 2020-04-02 MED ORDER — DICYCLOMINE HCL 10 MG PO CAPS: 10.0000 mg | ORAL_CAPSULE | Freq: Three times a day (TID) | ORAL | 0 refills | Status: DC

## 2020-04-02 NOTE — Progress Notes (Signed)
   CC: establish care and ED follow-up  HPI:  [name] is a 44 y.o. 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 Please see problem-based charting for additional information.   Social History   Tobacco Use  . Smoking status: Never Smoker  . Smokeless tobacco: Never Used  Substance Use Topics  . Alcohol use: Not on file  . Drug use: Not on file   Pt denies family history of hypertension, diabetes, or hyperlipidemia. No family history on file.  Review of Systems:   Review of Systems  Constitutional: Negative for chills, fever and weight loss.  Respiratory: Negative for cough and shortness of breath.   Cardiovascular: Negative for chest pain and palpitations.  Gastrointestinal: Positive for abdominal pain, blood in stool (one episode in ED), nausea and vomiting. Negative for constipation, diarrhea and melena.  Genitourinary: Negative for dysuria, flank pain and hematuria.  Musculoskeletal: Negative for back pain.  Skin: Negative.   Neurological: Negative for dizziness, weakness and headaches.  Psychiatric/Behavioral: Negative for depression. The patient is nervous/anxious.    Physical Exam:  Vitals:   04/02/20 0926  BP: 112/67  Pulse: 87  Temp: 98.7 F (37.1 C)  TempSrc: Oral  SpO2: 97%  Weight: 203 lb 12.8 oz (92.4 kg)   Physical Exam Vitals and nursing note reviewed.  Constitutional:      General: He is not in acute distress.    Appearance: Normal appearance. He is not ill-appearing.  Cardiovascular:     Rate and Rhythm: Normal rate and regular rhythm.     Heart sounds: Normal heart sounds.  Pulmonary:     Effort: Pulmonary effort is normal. No respiratory distress.     Breath sounds: Normal breath sounds. No wheezing, rhonchi or rales.  Abdominal:     General: Abdomen is flat. Bowel sounds are normal. There is no distension.     Palpations: Abdomen is soft.   Tenderness: There is abdominal tenderness (Lower quadrants). There is no guarding or rebound.  Skin:    General: Skin is warm and dry.  Neurological:     Mental Status: He is alert.  Psychiatric:        Mood and Affect: Mood normal.    Assessment & Plan:   See Encounters Tab for problem based charting.  Patient discussed with Dr. Mikey Bussing

## 2020-04-02 NOTE — Patient Instructions (Addendum)
Mr. Amescua,  It was nice meeting you today! I am glad you are feeling better after leaving the emergency room. I will continue to follow-up with the stool study testing performed in the ER and call you with any abnormal results.   For your abdominal pain/cramping, I have sent in a prescription for Bentyl which you can take up to four times a day. Continue to use Zofran at home as needed for nausea. If your symptoms reoccur and/or become severe, please reach out to our office for re-evaluation.  Please follow-up with our clinic in 3 months for a check in on your GI symptoms and routine health follow-up.  Thank you for letting us be a part of your care!

## 2020-04-13 ENCOUNTER — Other Ambulatory Visit: Payer: Self-pay | Admitting: Internal Medicine

## 2020-04-19 NOTE — Assessment & Plan Note (Signed)
Pt presents for follow-up for ED visit last evening for nausea, vomiting, and abdominal pain. He endorses intermittent episodes of nausea/vomiting and abdominal pain over the last few years occurring ever 3 months or so. Pt and wife eat mostly plant based with occasional fish and making the majority of their meals at home. This episode between gradually 1 week ago with abdominal pain in central abdomen. His symptoms then worsened over a day and he developed nausea and vomiting. He required EMS transport after developing bilateral arm numbness while vomiting in the car with his wife on the way to the ED. In the ED, pt had one episode of dark red stool. CT abdomen remarkable for enteritis with diarrheal state and extensive colonic diverticulosis. C diff testing was negative. He was given IV fluids, morphine for pain control, and started on cipro to empirically treat his diarrhea. Stool testing still pending.   Pt seen today and states his nausea and abdominal pain have somewhat improved since leaving the ED. Continues to have some residual lower quadrant abdominal pain. He has not taken any Norco due to not wanting to take opioid medication. Has zofran at home as well. Wife, who accompanies patient, wonders if these episodes are panic attacks or stress related. She notes they often occur around stressful times for the patient. Pt previously took Klonipin or Xanax around the times of these episodes. Discussed that panic attacks can certainly present with GI symptoms, but would like additional testing to make sure infectious etiologies or immunosuppressive medication side effects have been ruled out. Pt and wife agreeable to this plan. All questions and concerns addressed.   - follow-up stool studies - pt to see [facility] who manages his immunosuppressives - added bentyl for pt to try for abdominal cramping as opposed to Norco - will continue to monitor - would favor trialing adding PRN benzo in the future  vs GI referral given pt's CT findings

## 2020-04-19 NOTE — Assessment & Plan Note (Signed)
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 He is followed annually at [facility] and was last seen in Jan 2020. On prograf and cellcept for immunosuppression. He is on ASA and statin daily. 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 He has not yet scheduled his yearly appointment yet due to work and family schedules. States he alternates between echo, CTA, and LHC each year and is due for an echo this year.   - pt to call and schedule follow-up with [facility] - continue cellcept and prograf

## 2020-04-22 ENCOUNTER — Encounter (HOSPITAL_COMMUNITY): Payer: Self-pay | Admitting: Emergency Medicine

## 2020-04-22 ENCOUNTER — Telehealth: Payer: Self-pay | Admitting: *Deleted

## 2020-04-22 ENCOUNTER — Observation Stay (HOSPITAL_COMMUNITY)
Admission: EM | Admit: 2020-04-22 | Discharge: 2020-04-24 | Disposition: A | Discharge status: Home / Self Care | Payer: 59 | Attending: Internal Medicine | Admitting: Internal Medicine

## 2020-04-22 ENCOUNTER — Emergency Department (HOSPITAL_COMMUNITY): Payer: 59

## 2020-04-22 ENCOUNTER — Telehealth: Payer: Self-pay | Admitting: Internal Medicine

## 2020-04-22 ENCOUNTER — Encounter: Payer: Self-pay | Admitting: Internal Medicine

## 2020-04-22 DIAGNOSIS — F429 Obsessive-compulsive disorder, unspecified: Secondary | ICD-10-CM | POA: Diagnosis not present

## 2020-04-22 DIAGNOSIS — U071 COVID-19: Principal | ICD-10-CM | POA: Insufficient documentation

## 2020-04-22 DIAGNOSIS — R918 Other nonspecific abnormal finding of lung field: Secondary | ICD-10-CM | POA: Insufficient documentation

## 2020-04-22 DIAGNOSIS — Z79899 Other long term (current) drug therapy: Secondary | ICD-10-CM | POA: Diagnosis not present

## 2020-04-22 DIAGNOSIS — N179 Acute kidney failure, unspecified: Secondary | ICD-10-CM | POA: Diagnosis not present

## 2020-04-22 DIAGNOSIS — I420 Dilated cardiomyopathy: Secondary | ICD-10-CM | POA: Diagnosis not present

## 2020-04-22 DIAGNOSIS — Z881 Allergy status to other antibiotic agents status: Secondary | ICD-10-CM | POA: Diagnosis not present

## 2020-04-22 DIAGNOSIS — R0602 Shortness of breath: Secondary | ICD-10-CM

## 2020-04-22 DIAGNOSIS — Z882 Allergy status to sulfonamides status: Secondary | ICD-10-CM | POA: Insufficient documentation

## 2020-04-22 DIAGNOSIS — F419 Anxiety disorder, unspecified: Secondary | ICD-10-CM | POA: Diagnosis not present

## 2020-04-22 DIAGNOSIS — Z941 Heart transplant status: Secondary | ICD-10-CM | POA: Diagnosis not present

## 2020-04-22 DIAGNOSIS — R7401 Elevation of levels of liver transaminase levels: Secondary | ICD-10-CM | POA: Diagnosis not present

## 2020-04-22 DIAGNOSIS — Z880 Allergy status to penicillin: Secondary | ICD-10-CM | POA: Diagnosis not present

## 2020-04-22 DIAGNOSIS — D849 Immunodeficiency, unspecified: Secondary | ICD-10-CM

## 2020-04-22 LAB — POC SARS CORONAVIRUS 2 AG -  ED: SARS Coronavirus 2 Ag: POSITIVE — AB

## 2020-04-22 IMAGING — DX DG CHEST 1V PORT
1 series · 1 of 1 positions shown · non-contrast
Comparison: [DATE]

CLINICAL DATA: Shortness of breath fever chills body aches

EXAM:
PORTABLE CHEST 1 VIEW

[chest]
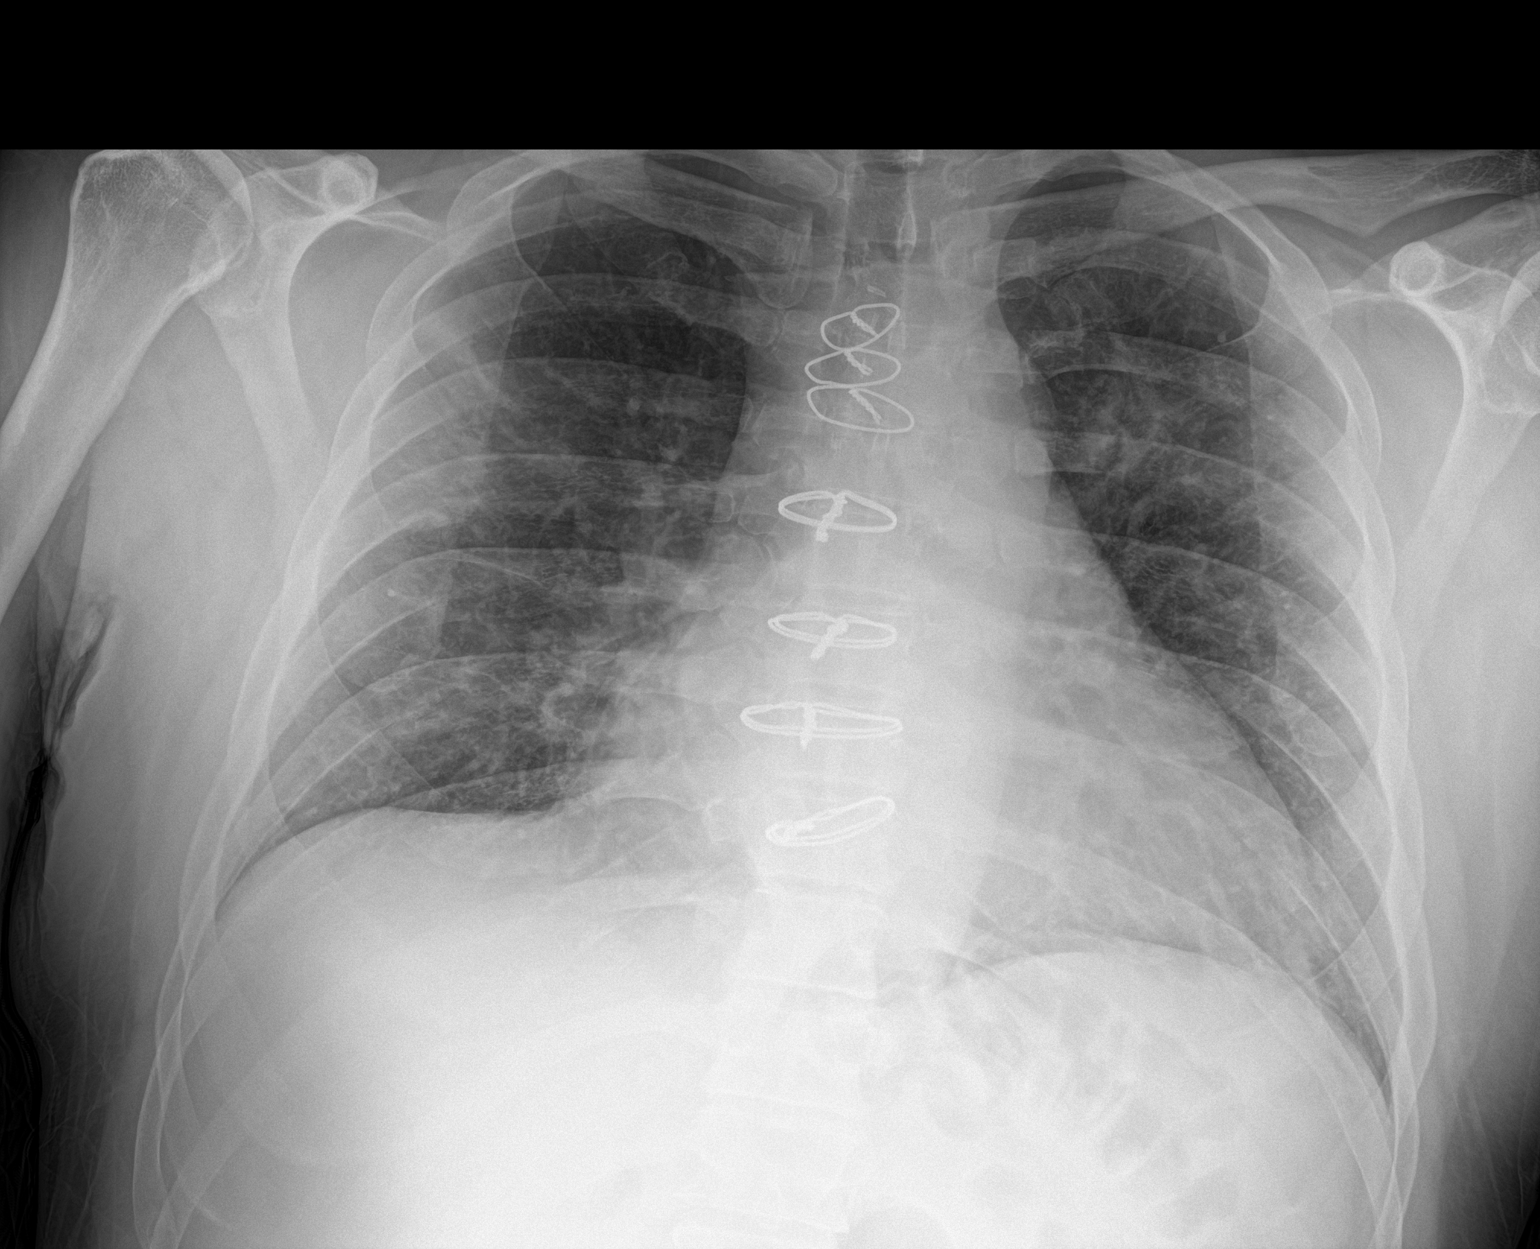

[1 of 1 positions shown; findings below may reference images not displayed]

FINDINGS: There is mild cardiomegaly. Overlying median sternotomy wires are
present. There is hazy airspace opacity seen within the left upper
lobe and periphery of the right upper lung. No pleural effusion or
pneumothorax is seen. No acute osseous abnormality.
IMPRESSION: Multifocal hazy airspace opacities within the bilateral upper lobes
which could be due to multifocal pneumonia.

## 2020-04-22 NOTE — Progress Notes (Signed)
Internal Medicine Clinic Attending  Case discussed with Dr. Jones at the time of the visit.  We reviewed the resident's history and exam and pertinent patient test results.  I agree with the assessment, diagnosis, and plan of care documented in the resident's note.  

## 2020-04-22 NOTE — ED Notes (Signed)
Pt in POV, presents with cough, fever, SOB, fever/chills body aches that started last Wednesday. States he was sent by PCP for eval given history of heart transplant in 2008. Fully vaccinated against COVID as of March 21, 2020.

## 2020-04-22 NOTE — Telephone Encounter (Signed)
Yes... patient needs telehealth and likely covid test as well

## 2020-04-22 NOTE — Telephone Encounter (Signed)
4 to 5 days Fever up to 100.8 Congested Dry H/a Eat and drink very little Diarrhea N&V- denies Body aches Wife got vaccine 4/28- [employer]fizer, wife got same symptoms within 24 hrs 4 to 5 days, 3 days later pt's symptoms started 4/8 pt got #2 of [employer]fizer  Does pt need TELEHEALTH APPT?

## 2020-04-22 NOTE — Hospital Course (Addendum)
Admitted 04/22/2020  Allergies: Amoxicillin-pot clavulanate, Clindamycin, and Sulfamethoxazole Pertinent Hx: Heart transplant secondary to dilated CMP, HTN, CAD?  44 y.o. male p/w N/V abdominal pain, diarrhea, p.o. intolerance, nasal congestion, cough, fever (T: 100.8)   *COVID-19 PNA: Respiratory and GI symptoms.  Now stable on room air.  Admitted due to subjective DOE with ambulation on ED and being immunocompromised (on CellCept & tacrolimus) at home. Giving remdesivir, checking inflammatory markers, BC and procalcitonin pending  *Mild AKI and transaminitis: (Cr 1.39 from 1.26, 3 weeks ago.  AST/ALT 55/50) Likely in setting of dehydration.  Giving normal saline.  Consults: Pharmacy  Meds: Remdesivir, CellCept, tacrolimus, Zofran, Crestor, as needed Klonopin VTE ppx: Lovenox  IVF: NS Diet: HH

## 2020-04-22 NOTE — Telephone Encounter (Signed)
Patient called after-hours emergency line to discuss symptoms. He attempted to have a telehealth visit earlier this afternoon but unfortunately was not able to get to his phone.  Patient was originally seen in our clinic on 04/19/20 with complaints of nausea/vomiting and abdominal pain. He and his wife had recently changed her diet and he thought that there might be an association; however, his symptoms have continued to progress. He now has central abdominal pain and severe nausea/vomiting. He has had a difficult time tolerating any PO intake. In addition to the nausea/vomiting and abdominal pain he has had fevers up to 100.43F, rhinorrhea, nasal congestion, cough, and headache. He is concerned that he may have an infection, primarily COVID. Of note he did complete both COVID vaccines on 4/8 and his wife received her second dose on 4/28. Shortly after receiving her second dose his wife developed symptoms (similar to his) and then he developed his current symptoms. This is why he thinks he may have COVID.  He is a heart transplant patient currently on mycophenolate and tacrolimus. He has been taking these medications as prescribed. On review of records he did have a CT abdomen on 04/01/20 that illustrated diffuse enteritis and extensive colonic diverticulosis.  The patient is high risk given his history of heart transplant and use of immunosuppressive medications (mycophenolate and tacrolimus). Given his fevers, systemic symptoms, and failure to improve after a week of conservative therapy I recommended he come to the emergency department for urgent evaluation. He voices understanding. All questions and concerns addressed.  Levora Dredge, MD

## 2020-04-22 NOTE — Telephone Encounter (Signed)
Called pt and gave telehealth for am 5/11 and also ask pt to be tested for COVID also gave him precautions for COVID, he was agreeable

## 2020-04-22 NOTE — Telephone Encounter (Signed)
I tried to call the patient 3 times without answer. I tried to leave a voicemail, but was unable. I will try to call him again tomorrow morning.

## 2020-04-23 ENCOUNTER — Telehealth: Payer: Self-pay | Admitting: Unknown Physician Specialty

## 2020-04-23 ENCOUNTER — Ambulatory Visit: Payer: Managed Care, Other (non HMO)

## 2020-04-23 DIAGNOSIS — R0602 Shortness of breath: Secondary | ICD-10-CM | POA: Diagnosis not present

## 2020-04-23 DIAGNOSIS — U071 COVID-19: Secondary | ICD-10-CM | POA: Diagnosis present

## 2020-04-23 LAB — CBC
HCT: 39.1 % (ref 39.0–52.0)
Hemoglobin: 13.1 g/dL (ref 13.0–17.0)
MCH: 29.4 pg (ref 26.0–34.0)
MCHC: 33.5 g/dL (ref 30.0–36.0)
MCV: 87.9 fL (ref 80.0–100.0)
Platelets: 192 10*3/uL (ref 150–400)
RBC: 4.45 MIL/uL (ref 4.22–5.81)
RDW: 11.9 % (ref 11.5–15.5)
WBC: 3.6 10*3/uL — ABNORMAL LOW (ref 4.0–10.5)
nRBC: 0 % (ref 0.0–0.2)

## 2020-04-23 LAB — CBC WITH DIFFERENTIAL/PLATELET
Abs Immature Granulocytes: 0.01 10*3/uL (ref 0.00–0.07)
Basophils Absolute: 0 10*3/uL (ref 0.0–0.1)
Basophils Relative: 0 %
Eosinophils Absolute: 0 10*3/uL (ref 0.0–0.5)
Eosinophils Relative: 1 %
HCT: 39.1 % (ref 39.0–52.0)
Hemoglobin: 12.7 g/dL — ABNORMAL LOW (ref 13.0–17.0)
Immature Granulocytes: 0 %
Lymphocytes Relative: 30 %
Lymphs Abs: 1.1 10*3/uL (ref 0.7–4.0)
MCH: 28.8 pg (ref 26.0–34.0)
MCHC: 32.5 g/dL (ref 30.0–36.0)
MCV: 88.7 fL (ref 80.0–100.0)
Monocytes Absolute: 0.3 10*3/uL (ref 0.1–1.0)
Monocytes Relative: 7 %
Neutro Abs: 2.1 10*3/uL (ref 1.7–7.7)
Neutrophils Relative %: 62 %
Platelets: 155 10*3/uL (ref 150–400)
RBC: 4.41 MIL/uL (ref 4.22–5.81)
RDW: 11.9 % (ref 11.5–15.5)
WBC: 3.5 10*3/uL — ABNORMAL LOW (ref 4.0–10.5)
nRBC: 0 % (ref 0.0–0.2)

## 2020-04-23 LAB — BLOOD CULTURE ID PANEL (REFLEXED)

## 2020-04-23 LAB — COMPREHENSIVE METABOLIC PANEL
ALT: 50 U/L — ABNORMAL HIGH (ref 0–44)
AST: 55 U/L — ABNORMAL HIGH (ref 15–41)
Albumin: 3.7 g/dL (ref 3.5–5.0)
Alkaline Phosphatase: 65 U/L (ref 38–126)
Anion gap: 11 (ref 5–15)
BUN: 22 mg/dL — ABNORMAL HIGH (ref 6–20)
CO2: 20 mmol/L — ABNORMAL LOW (ref 22–32)
Calcium: 8.7 mg/dL — ABNORMAL LOW (ref 8.9–10.3)
Chloride: 105 mmol/L (ref 98–111)
Creatinine, Ser: 1.39 mg/dL — ABNORMAL HIGH (ref 0.61–1.24)
GFR calc Af Amer: >60 mL/min (ref >60)
GFR calc non Af Amer: >60 mL/min (ref >60)
Glucose, Bld: 95 mg/dL (ref 70–99)
Potassium: 4.2 mmol/L (ref 3.5–5.1)
Sodium: 136 mmol/L (ref 135–145)
Total Bilirubin: 1 mg/dL (ref 0.3–1.2)
Total Protein: 7.1 g/dL (ref 6.5–8.1)

## 2020-04-23 LAB — ABO/RH: ABO/RH(D): A POS

## 2020-04-23 LAB — PROCALCITONIN: Procalcitonin: <0.1 ng/mL

## 2020-04-23 LAB — FIBRINOGEN: Fibrinogen: 556 mg/dL — ABNORMAL HIGH (ref 210–475)

## 2020-04-23 LAB — FERRITIN: Ferritin: 685 ng/mL — ABNORMAL HIGH (ref 24–336)

## 2020-04-23 LAB — HIV ANTIBODY (ROUTINE TESTING W REFLEX): HIV Screen 4th Generation wRfx: NONREACTIVE

## 2020-04-23 LAB — D-DIMER, QUANTITATIVE: D-Dimer, Quant: 0.51 ug/mL-FEU — ABNORMAL HIGH (ref 0.00–0.50)

## 2020-04-23 MED ORDER — ONDANSETRON HCL 4 MG/2ML IJ SOLN: 4.0000 mg | Freq: Four times a day (QID) | INTRAMUSCULAR | Status: DC | PRN

## 2020-04-23 MED ORDER — ALBUTEROL SULFATE HFA 108 (90 BASE) MCG/ACT IN AERS
2.0000 | INHALATION_SPRAY | Freq: Four times a day (QID) | RESPIRATORY_TRACT | Status: DC | PRN
  Filled 2020-04-23: qty 6.7

## 2020-04-23 MED ORDER — SODIUM CHLORIDE 0.9 % IV SOLN
100.0000 mg | Freq: Every day | INTRAVENOUS | Status: DC
  Administered 2020-04-24: 100 mg via INTRAVENOUS
  Filled 2020-04-23: qty 20

## 2020-04-23 MED ORDER — CLONAZEPAM 1 MG PO TABS
1.0000 mg | ORAL_TABLET | Freq: Two times a day (BID) | ORAL | Status: DC | PRN
  Administered 2020-04-23: 1 mg via ORAL
  Filled 2020-04-23: qty 1

## 2020-04-23 MED ORDER — ALBUTEROL SULFATE HFA 108 (90 BASE) MCG/ACT IN AERS
2.0000 | INHALATION_SPRAY | Freq: Four times a day (QID) | RESPIRATORY_TRACT | Status: DC
  Administered 2020-04-23: 05:00:00 2 via RESPIRATORY_TRACT
  Filled 2020-04-23: qty 6.7

## 2020-04-23 MED ORDER — RAMELTEON 8 MG PO TABS
8.0000 mg | ORAL_TABLET | Freq: Every day | ORAL | Status: DC
  Administered 2020-04-23: 21:00:00 8 mg via ORAL
  Filled 2020-04-23 (×2): qty 1

## 2020-04-23 MED ORDER — LORAZEPAM 2 MG/ML IJ SOLN
1.0000 mg | Freq: Once | INTRAMUSCULAR | Status: AC
  Administered 2020-04-23: 1 mg via INTRAVENOUS
  Filled 2020-04-23: qty 1

## 2020-04-23 MED ORDER — ASCORBIC ACID 500 MG PO TABS
500.0000 mg | ORAL_TABLET | Freq: Every day | ORAL | Status: DC
  Administered 2020-04-23 – 2020-04-24 (×2): 500 mg via ORAL
  Filled 2020-04-23 (×2): qty 1

## 2020-04-23 MED ORDER — ZINC SULFATE 220 (50 ZN) MG PO CAPS
220.0000 mg | ORAL_CAPSULE | Freq: Every day | ORAL | Status: DC
  Administered 2020-04-23 – 2020-04-24 (×2): 220 mg via ORAL
  Filled 2020-04-23 (×2): qty 1

## 2020-04-23 MED ORDER — MYCOPHENOLATE MOFETIL 250 MG PO CAPS
1500.0000 mg | ORAL_CAPSULE | Freq: Two times a day (BID) | ORAL | Status: DC
  Administered 2020-04-23 – 2020-04-24 (×3): 1500 mg via ORAL
  Filled 2020-04-23 (×4): qty 6

## 2020-04-23 MED ORDER — GUAIFENESIN 100 MG/5ML PO SOLN
10.0000 mL | ORAL | Status: DC | PRN
  Filled 2020-04-23: qty 10

## 2020-04-23 MED ORDER — SODIUM CHLORIDE 0.9 % IV SOLN: INTRAVENOUS | Status: AC

## 2020-04-23 MED ORDER — ONDANSETRON HCL 4 MG PO TABS: 4.0000 mg | ORAL_TABLET | Freq: Four times a day (QID) | ORAL | Status: DC | PRN

## 2020-04-23 MED ORDER — SODIUM CHLORIDE 0.9% FLUSH
3.0000 mL | Freq: Two times a day (BID) | INTRAVENOUS | Status: DC
  Administered 2020-04-23 – 2020-04-24 (×3): 3 mL via INTRAVENOUS

## 2020-04-23 MED ORDER — ACETAMINOPHEN 325 MG PO TABS
650.0000 mg | ORAL_TABLET | Freq: Four times a day (QID) | ORAL | Status: DC | PRN
  Administered 2020-04-23: 650 mg via ORAL
  Filled 2020-04-23: qty 2

## 2020-04-23 MED ORDER — CITALOPRAM HYDROBROMIDE 20 MG PO TABS
30.0000 mg | ORAL_TABLET | Freq: Every day | ORAL | Status: DC
  Administered 2020-04-23: 21:00:00 30 mg via ORAL
  Filled 2020-04-23: qty 2

## 2020-04-23 MED ORDER — ENOXAPARIN SODIUM 40 MG/0.4ML ~~LOC~~ SOLN
40.0000 mg | SUBCUTANEOUS | Status: DC
  Administered 2020-04-23 – 2020-04-24 (×2): 40 mg via SUBCUTANEOUS
  Filled 2020-04-23 (×2): qty 0.4

## 2020-04-23 MED ORDER — SODIUM CHLORIDE 0.9 % IV SOLN
200.0000 mg | Freq: Once | INTRAVENOUS | Status: AC
  Administered 2020-04-23: 09:00:00 200 mg via INTRAVENOUS
  Filled 2020-04-23: qty 40

## 2020-04-23 MED ORDER — TACROLIMUS 1 MG PO CAPS
2.0000 mg | ORAL_CAPSULE | Freq: Two times a day (BID) | ORAL | Status: DC
  Administered 2020-04-23 – 2020-04-24 (×3): 2 mg via ORAL
  Filled 2020-04-23 (×4): qty 2

## 2020-04-23 MED ORDER — HYDROXYZINE HCL 25 MG PO TABS
25.0000 mg | ORAL_TABLET | ORAL | Status: DC | PRN
  Administered 2020-04-23: 21:00:00 25 mg via ORAL
  Filled 2020-04-23: qty 1

## 2020-04-23 MED ORDER — ROSUVASTATIN CALCIUM 20 MG PO TABS
20.0000 mg | ORAL_TABLET | Freq: Every day | ORAL | Status: DC
  Administered 2020-04-23: 21:00:00 20 mg via ORAL
  Filled 2020-04-23: qty 1

## 2020-04-23 NOTE — Telephone Encounter (Signed)
I agree

## 2020-04-23 NOTE — Progress Notes (Signed)
Subjective:   Pt was seen at the bedside today.  Patient appears comfortable and is not having any issues breathing.  He states that his primary complaint is nausea and diarrhea.  He has been able to take in some water and orange juice today.  We discussed how we will continue to give him supportive care to keep him hydrated and monitor his kidney function.  He voices understanding.  He mentions that he does have anxiety and was inquiring about additional anxiety medications.  We reassured him that we would go over his home medication list and put in additional orders if needed.  No other complaints at this time.  Objective: CBC Latest Ref Rng & Units 04/23/2020 04/22/2020 04/01/2020  WBC 4.0 - 10.5 K/uL 3.5(L) 3.6(L) 10.2  Hemoglobin 13.0 - 17.0 g/dL 12.7(L) 13.1 14.5  Hematocrit 39.0 - 52.0 % 39.1 39.1 43.0  Platelets 150 - 400 K/uL 155 192 241   BMP Latest Ref Rng & Units 04/22/2020 04/01/2020  Glucose 70 - 99 mg/dL 95 124(H)  BUN 6 - 20 mg/dL 22(H) 18  Creatinine 0.61 - 1.24 mg/dL 1.39(H) 1.26(H)  Sodium 135 - 145 mmol/L 136 139  Potassium 3.5 - 5.1 mmol/L 4.2 3.9  Chloride 98 - 111 mmol/L 105 104  CO2 22 - 32 mmol/L 20(L) 22  Calcium 8.9 - 10.3 mg/dL 8.7(L) 9.9   Vital signs in last 24 hours: Vitals:   04/23/20 0500 04/23/20 0630 04/23/20 0700 04/23/20 0830  BP: 129/85 116/80 128/84 121/78  Pulse: 84 86 87 84  Resp: 19 (!) 21 (!) 29 (!) 26  Temp:      TempSrc:      SpO2: 96% 96% 93% 96%  Weight:      Height:       Physical Exam General: Resting in bed comfortably HEENT: NCAT CV: RRR, normal S1 and S2 no murmurs rubs or gallops PULM: Clear to auscultation in all lung fields ABD: Soft, mildly tender in all quadrants to light palpation NEURO: Alert and oriented, nonfocal  Assessment/Plan:  Active Problems:   COVID-19 virus infection  In summary, Mr. Shawn Calderon is a 44 year old gentleman with a past medical history significant for heart transplant 2008 due to dilated  cardiomyopathy (on tacrolimus and CellCept) and anxiety who presented with a 5-day history of fever, SOB on exertion, diarrhea and generalized body aches.  Although the patient has been fully vaccinated, he tested positive for COVID-19.  #COVID-19 #Diarrhea: Despite being fully vaccinated as of 4/8, the patient still tested positive for COVID-19 on 5/10.  He has no known exposures.  I suspect his immune suppressive state with CellCept and tacrolimus likely did not result of a robust immune response.  His respiratory status is stable, saturating in the upper 90s on room air, however he continues to have multiple episodes of diarrhea and nausea. -Remdesivir per pharmacy (day 1/5) -Daily inflammatory labs (CRP, D-dimer, ferritin, fibrinogen, procalcitonin) -Tylenol 650 mg every 6 hours as needed -Flutter valve and incentive spirometry -Telemetry   #Heart transplant 2008: Patient has a history of heart transplant 2008 due to cardiomyopathy requiring from LVAD bridge to transplant.  Patient currently takes tacrolimus and myocphenylate.  His transplant care has been stable, and follows up with [facility] annually. -Mycophenolate 1500 mg every 12 hours -Tacrolimus 2 mg twice daily -Rosuvastatin 20 mg daily   #Anxiety: Patient takes citalopram and clonazepam 0.5 mg twice daily at home. -Citalopram 30 mg daily -Clonazepam 1 mg twice daily PRN -Atarax 35 mg  q4hrs PRN  #FEN/GI -Diet: Heart healthy -Fluids: NS 100 cc/hr -Zofran 4 mg q6hrs PRN  #DVT prophylaxis -Lovenox 40 mg subcu injections daily  #CODE STATUS: FULL  #Dispo: Anticipated discharge today or tomorrow Prior to Admission Living Arrangement: Home Anticipated Discharge Location: Home  Barriers to Discharge: Ongoing medical management  Kirt Boys, MD Internal Medicine, PGY1 Pager: [phone]  04/23/2020,10:05 AM

## 2020-04-23 NOTE — ED Provider Notes (Signed)
[facility] EMERGENCY DEPARTMENT Provider Note   CSN: [id number] Arrival date & time: 04/22/20  2109     History Chief Complaint  Patient presents with  . Shortness of Breath    Shawn Calderon is a 44 y.o. male.  Patient with history of anxiety, heart transplant (2008), HLD, HTN, OCD presents to ED with symptoms of SOB/DOE for the past 2 days. He reports fever, headache, nasal and chest congestion, cough and body aches. He has chest pain that is associated with cough, otherwise, he denies chest pain. No loss of taste or smell. He reports poor appetite and little fluids due to nausea. No vomiting. His nausea actually started last month, along with diarrhea, and has been persistent since that time. He received his COVID vaccine over one month ago.   The history is provided by the patient. No language interpreter was used.  Shortness of Breath Associated symptoms: cough, fever, headaches and sore throat        Past Medical History:  Diagnosis Date  . Heart transplant recipient [facility]) 2008    Patient Active Problem List   Diagnosis Date Noted  . Intractable vomiting 04/02/2020  . Bilateral impacted cerumen 09/28/2019  . 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 Hyperlipidemia 12/27/2012  . Heart transplant status (HCC) 11/24/2011  . Hypertension 11/24/2011  . OCD (obsessive compulsive disorder) 11/24/2011    History reviewed. No pertinent surgical history.     No family history on file.  Social History   Tobacco Use  . Smoking status: Never Smoker  . Smokeless tobacco: Never Used  Substance Use Topics  . Alcohol use: Not Currently  . Drug use: Not Currently    Home Medications Prior to Admission medications   Medication Sig Start Date End Date Taking? Authorizing Provider  ciprofloxacin (CIPRO) 500 MG tablet Take 1 tablet (500 mg total) by mouth every 12 (twelve) hours. 04/01/20   Bethel Born, PA-C    ciprofloxacin-dexamethasone (CIPRODEX) OTIC suspension Place 4 drops into the left ear 2 (two) times daily. For 7 days 03/26/20   [provider]  citalopram (CELEXA) 20 MG tablet Take 30 mg by mouth at bedtime.  09/01/18 01/18/21  [provider]  clonazePAM (KLONOPIN) 0.5 MG tablet Take 1 mg by mouth 2 (two) times daily as needed for anxiety. 09/01/18   [provider]  dicyclomine (BENTYL) 10 MG capsule Take 1 capsule (10 mg total) by mouth 4 (four) times daily -  before meals and at bedtime. 04/02/20   Thom Chimes, MD  fluticasone (FLONASE) 50 MCG/ACT nasal spray Place 2 sprays into both nostrils as needed for allergies. 12/09/18   [provider]  HYDROcodone-acetaminophen (NORCO/VICODIN) 5-325 MG tablet Take 1 tablet by mouth every 4 (four) hours as needed. 04/01/20   Bethel Born, PA-C  loratadine (CLARITIN) 10 MG tablet Take 10 mg by mouth daily.    [provider]  mycophenolate (CELLCEPT) 500 MG tablet Take 1,500 mg by mouth every 12 (twelve) hours. 09/01/18 01/19/21  [provider]  ondansetron (ZOFRAN ODT) 4 MG disintegrating tablet Take 1 tablet (4 mg total) by mouth every 8 (eight) hours as needed for nausea or vomiting. 04/01/20   Bethel Born, PA-C  rosuvastatin (CRESTOR) 20 MG tablet Take 20 mg by mouth at bedtime. 01/19/20   [provider]  tacrolimus (PROGRAF) 1 MG capsule Take 2 mg by mouth 2 (two) times daily.  09/01/18  [provider]    Allergies    Amoxicillin-pot clavulanate, Clindamycin, and Sulfamethoxazole  Review of Systems   Review of Systems  Constitutional: Positive for appetite change, chills and fever.  HENT: Positive for congestion and sore throat.   Respiratory: Positive for cough and shortness of breath.   Cardiovascular: Negative.   Gastrointestinal: Positive for diarrhea and nausea.  Genitourinary: Negative for dysuria.  Musculoskeletal: Negative.   Skin: Negative.    Neurological: Positive for weakness (Generalized) and headaches.    Physical Exam Updated Vital Signs BP 120/80   Pulse 80   Temp 100 F (37.8 C) (Oral)   Resp 15   Ht 5\' 10"  (1.778 m)   Wt 88.5 kg   SpO2 97%   BMI 27.98 kg/m   Physical Exam Vitals and nursing note reviewed.  Constitutional:      General: He is not in acute distress.    Appearance: He is well-developed. He is not toxic-appearing.  HENT:     Head: Normocephalic.  Cardiovascular:     Rate and Rhythm: Normal rate and regular rhythm.  Pulmonary:     Effort: Pulmonary effort is normal.     Breath sounds: Rales (Diffuse) present. No wheezing or rhonchi.  Chest:     Chest wall: No tenderness.  Abdominal:     General: Bowel sounds are normal.     Palpations: Abdomen is soft.     Tenderness: There is no abdominal tenderness. There is no guarding or rebound.  Musculoskeletal:        General: Normal range of motion.     Cervical back: Normal range of motion and neck supple.     Right lower leg: No edema.     Left lower leg: No edema.  Skin:    General: Skin is warm and dry.     Findings: No rash.  Neurological:     Mental Status: He is alert and oriented to person, place, and time.  Psychiatric:        Mood and Affect: Mood is anxious.     ED Results / Procedures / Treatments   Labs (all labs ordered are listed, but only abnormal results are displayed) Labs Reviewed  CBC - Abnormal; Notable for the following components:      Result Value   WBC 3.6 (*)    All other components within normal limits  COMPREHENSIVE METABOLIC PANEL - Abnormal; Notable for the following components:   CO2 20 (*)    BUN 22 (*)    Creatinine, Ser 1.39 (*)    Calcium 8.7 (*)    AST 55 (*)    ALT 50 (*)    All other components within normal limits  POC SARS CORONAVIRUS 2 AG -  ED - Abnormal; Notable for the following components:   SARS Coronavirus 2 Ag POSITIVE (*)    All other components within normal limits     EKG None  Radiology DG Chest Port 1 View  Result Date: 04/22/2020 CLINICAL DATA:  Shortness of breath fever chills body aches EXAM: PORTABLE CHEST 1 VIEW COMPARISON:  February 05, 2014 FINDINGS: There is mild cardiomegaly. Overlying median sternotomy wires are present. There is hazy airspace opacity seen within the left upper lobe and periphery of the right upper lung. No pleural effusion or pneumothorax is seen. No acute osseous abnormality. IMPRESSION: Multifocal hazy airspace opacities within the bilateral upper lobes which could be due to multifocal pneumonia. Electronically Signed   By: Prudencio Pair  M.D.   On: 04/22/2020 23:52    Procedures Procedures (including critical care time)  Medications Ordered in ED Medications - No data to display  ED Course  I have reviewed the triage vital signs and the nursing notes.  Pertinent labs & imaging results that were available during my care of the patient were reviewed by me and considered in my medical decision making (see chart for details).    MDM Rules/Calculators/A&P                      Patient to ED with SOB/DOE x 2 days. Ss/sxs as detailed in HPI.   He is overall well appearing. He seems anxious. He is subjectively SOB with ambulation though his pulse ox stays 93% when walking. COVID test positive.   Feel that with his complicated medical history that he would benefit from admission and consideration of aggressive tx options for COVID. Internal medicine resident paged for admission. For now, will provide Ativan for anxiety. He is hemodynamically stable for admission.  Final Clinical Impression(s) / ED Diagnoses Final diagnoses:  SOB (shortness of breath)   1. COVID 2. Immunocompromised   Rx / DC Orders ED Discharge Orders    None       Elpidio Anis, PA-C 04/23/20 0302    Ward, Layla Maw, DO 04/23/20 [phone]

## 2020-04-23 NOTE — Progress Notes (Signed)
PHARMACY - PHYSICIAN COMMUNICATION CRITICAL VALUE ALERT - BLOOD CULTURE IDENTIFICATION (BCID)  Shawn Calderon is an 44 y.o. male who presented to [facility] on 04/22/2020 with a chief complaint of SOB, currently getting Remdesivir for Covid-19.  Assessment:  Blood culture growing 1 of 4 Staph spp, Mec A+.  Currently afebrile without leukocytosis.  Is not ill-appearing per MD.  Name of physician (or Provider) Contacted: Dr. Yetta Barre  Current antibiotics: none  Changes to prescribed antibiotics recommended:  Recommendations accepted by provider.  Hold off on starting antibiotics; monitor culture data.  Results for orders placed or performed during the hospital encounter of 04/22/20  Blood Culture ID Panel (Reflexed) (Collected: 04/23/2020  5:04 AM)  Result Value Ref Range   Enterococcus species NOT DETECTED NOT DETECTED   Listeria monocytogenes NOT DETECTED NOT DETECTED   Staphylococcus species DETECTED (A) NOT DETECTED   Staphylococcus aureus (BCID) NOT DETECTED NOT DETECTED   Methicillin resistance DETECTED (A) NOT DETECTED   Streptococcus species NOT DETECTED NOT DETECTED   Streptococcus agalactiae NOT DETECTED NOT DETECTED   Streptococcus pneumoniae NOT DETECTED NOT DETECTED   Streptococcus pyogenes NOT DETECTED NOT DETECTED   Acinetobacter baumannii NOT DETECTED NOT DETECTED   Enterobacteriaceae species NOT DETECTED NOT DETECTED   Enterobacter cloacae complex NOT DETECTED NOT DETECTED   Escherichia coli NOT DETECTED NOT DETECTED   Klebsiella oxytoca NOT DETECTED NOT DETECTED   Klebsiella pneumoniae NOT DETECTED NOT DETECTED   Proteus species NOT DETECTED NOT DETECTED   Serratia marcescens NOT DETECTED NOT DETECTED   Haemophilus influenzae NOT DETECTED NOT DETECTED   Neisseria meningitidis NOT DETECTED NOT DETECTED   Pseudomonas aeruginosa NOT DETECTED NOT DETECTED   Candida albicans NOT DETECTED NOT DETECTED   Candida glabrata NOT DETECTED NOT DETECTED   Candida krusei NOT  DETECTED NOT DETECTED   Candida parapsilosis NOT DETECTED NOT DETECTED   Candida tropicalis NOT DETECTED NOT DETECTED    [name] D. Laney Potash, PharmD, BCPS, BCCCP 04/23/2020, 10:26 PM

## 2020-04-23 NOTE — ED Notes (Signed)
Shawn Calderon wife [phone] asking about covid test results

## 2020-04-23 NOTE — ED Notes (Signed)
Pt ambulated with pulse OX. O2 stayed above 93% while ambulating. Pt states he has mild shortness of breath while ambulating.

## 2020-04-23 NOTE — Telephone Encounter (Signed)
Called to discuss with patient about Covid symptoms and the use of bamlanivimab, a monoclonal antibody infusion for those with mild to moderate Covid symptoms and at a high risk of hospitalization.  Pt is qualified for this infusion at the [facility] infusion center due to immuno-compromised   Message left to call back  Mychart message sent

## 2020-04-23 NOTE — Progress Notes (Signed)
Shawn Calderon is a 44 y.o. male patient admitted from ED awake, alert - oriented  X 4 - no acute distress noted.  VSS - Blood pressure (!) 119/92, pulse 77, temperature 99.1 F (37.3 C), temperature source Oral, resp. rate 19, height 5\' 10"  (1.778 m), weight 88.5 kg, SpO2 94 %.    IV in place, occlusive dsg intact without redness.  Orientation to room, and floor completed with information packet given to patient/family.  Patient declined safety video at this time.  Admission INP armband ID verified with patient/family, and in place.   SR up x 2, fall assessment complete, with patient and family able to verbalize understanding of risk associated with falls, and verbalized understanding to call nsg before up out of bed.  Call light within reach, patient able to voice, and demonstrate understanding.  Skin, clean-dry- intact without evidence of bruising, or skin tears.   No evidence of skin break down noted on exam.     Will cont to eval and treat per MD orders.  , RN 04/23/2020 1:30 PM

## 2020-04-23 NOTE — Telephone Encounter (Signed)
Called to discuss with [name] about Covid symptoms and the use of bamlanivimab, a monoclonal antibody infusion for those with mild to moderate Covid symptoms and at a high risk of hospitalization.     Pt was in the ER when I talked to him and discussed plans to admit to the hospital.  In this case of severe illness, he is not a candidate for mab infusion

## 2020-04-23 NOTE — H&P (Signed)
Date: 04/23/2020               Patient Name:  Shawn Calderon MRN: [id number]  DOB: 06/21/1976 Age / Sex: 44 y.o., male   PCP: Marianna Payment, MD         Medical Service: Internal Medicine Teaching Service         Attending Physician: Dr. Velna Ochs, MD    First Contact: Dr. Earlene Plater Pager: [phone]  Second Contact: Dr. Modena Nunnery Pager: [phone]       After Hours (After 5p/  First Contact Pager: [phone]  weekends / holidays): Second Contact Pager: [phone]   Chief Complaint: shortness of breath  History of Present Illness:  Mr. Shawn Calderon is a 44 year old M with significant PMH of heart transplant in 2008 due to dilated cardiomyopathy and anxiety, who presents to the emergency room for fever, shortness of breath, and generalized body aches. He was seen in the emergency room 3 weeks ago for nausea/vomiting and abdominal pain that was attributed to viral gastroenteritis vs anxiety. Pt states since that time he has not felt back to his baseline, but had an acute worsening over the last five days of cough, dyspnea, fevers, and malaise. Endorses trying to work out at a home gym during this time and not having energy. Has been taking bentyl at home for abdominal cramps/pain which mildly improves his symptoms. Continues to have diarrhea with 5 bowel movements per today. Does not believe that he has been able to keep up with PO intake at home secondary to nausea and not feeling well. Review of systems positive for sinus pressure/headache, cough, congestion, and dizziness when standing. Tmax at home was 100.71F. His wife at home had similar symptoms after received her COVID vaccine two weeks ago, though her symptoms resolved within 12-24hrs. Pt completed his COVID vaccine course on 4/8. Denies any sick contacts or known COVID exposures.   In the ED, pt had low grade fever of 100F, HR 93, BP 129/88, RR 22, and O2 saturation 96% on room air. POC COVID antigen positive. CXR with  multifocal hazy airspace opacities due to possible multifocal pneumonia. CBC within normal limits except for WBC 3.6. CMP with mild elevation in AST and ALT to 55 and 50 respectively, alk phos and T bili normal. Cr 1.39 (was 1.26 three weeks ago and previously 1.1-1.4 dating back to 2015). Bicarb 20, BUN 22, K 4.2, and Na 136.   Pt ambulated on room air and was subjectively short of breath, though maintained O2 saturation >93%. He received one dose of 83m ativan for anxiety. IMTS was called for admission.   Meds: Current Meds  Medication Sig  . acetaminophen (TYLENOL) 500 MG tablet Take 1,000 mg by mouth 2 (two) times daily as needed for mild pain, fever or headache.  . citalopram (CELEXA) 20 MG tablet Take 30 mg by mouth at bedtime.   . clonazePAM (KLONOPIN) 0.5 MG tablet Take 1 mg by mouth 2 (two) times daily as needed for anxiety.  . dicyclomine (BENTYL) 10 MG capsule Take 1 capsule (10 mg total) by mouth 4 (four) times daily -  before meals and at bedtime.  . fluticasone (FLONASE) 50 MCG/ACT nasal spray Place 2 sprays into both nostrils as needed for allergies.  .Marland Kitchenloratadine (CLARITIN) 10 MG tablet Take 10 mg by mouth daily.  . Multiple Vitamin (MULTIVITAMIN WITH MINERALS) TABS tablet Take 1 tablet by mouth daily.  . mycophenolate (CELLCEPT) 500 MG tablet  Take 1,500 mg by mouth every 12 (twelve) hours.  . rosuvastatin (CRESTOR) 20 MG tablet Take 20 mg by mouth at bedtime.  . tacrolimus (PROGRAF) 1 MG capsule Take 2 mg by mouth 2 (two) times daily.    Allergies: Allergies as of 04/22/2020 - Review Complete 04/01/2020  Allergen Reaction Noted  . Amoxicillin-pot clavulanate Diarrhea 12/09/2018  . Clindamycin Other (See Comments) 12/09/2018  . Sulfamethoxazole Rash 01/03/2019   Past Medical History:  Diagnosis Date  . Heart transplant recipient [facility]) 2008   Family History:  No family history of hypertension, diabetes, or hyperlipidemia.  Social History:  Pt lives at home in  [place] with his wife. Never smoker. No EtOH, marijuana, or other substance use.   Review of Systems: Review of Systems  Constitutional: Positive for diaphoresis, fever and malaise/fatigue. Negative for chills and weight loss.       Poor PO intake  HENT: Positive for congestion and sinus pain. Negative for ear pain, sore throat and tinnitus.   Eyes: Negative for blurred vision, double vision and photophobia.  Respiratory: Positive for cough and shortness of breath. Negative for sputum production.   Cardiovascular: Negative for chest pain, palpitations and leg swelling.  Gastrointestinal: Positive for abdominal pain (central) and diarrhea. Negative for constipation, heartburn, nausea and vomiting.  Genitourinary: Negative for dysuria and frequency.  Musculoskeletal: Negative for falls and myalgias.  Neurological: Positive for dizziness. Negative for sensory change, focal weakness, loss of consciousness and headaches.  Psychiatric/Behavioral: The patient is nervous/anxious.    Physical Exam: Blood pressure 120/80, pulse 80, temperature 100 F (37.8 C), temperature source Oral, resp. rate 15, height 5' 10"  (1.778 m), weight 88.5 kg, SpO2 97 %. Physical Exam Vitals and nursing note reviewed.  Constitutional:      General: He is not in acute distress.    Appearance: He is not ill-appearing.     Comments: Well appearing male laying down in stretcher.  HENT:     Head: Normocephalic and atraumatic.     Mouth/Throat:     Mouth: Mucous membranes are dry.     Pharynx: No oropharyngeal exudate or posterior oropharyngeal erythema.  Eyes:     Conjunctiva/sclera: Conjunctivae normal.  Cardiovascular:     Rate and Rhythm: Normal rate and regular rhythm.     Heart sounds: Normal heart sounds. No murmur. No friction rub. No gallop.   Pulmonary:     Effort: Pulmonary effort is normal. No respiratory distress.     Comments: On room air. Course breath sounds in right middle and left upper lobes  predominantly. Good air movement throughout. Abdominal:     General: Abdomen is flat. Bowel sounds are normal. There is no distension.     Palpations: Abdomen is soft.     Tenderness: There is abdominal tenderness. There is no guarding or rebound.     Comments: Diffuse abdominal tenderness to both light and deep palpation, worst in epigastric and lower quadrants.  Musculoskeletal:        General: Normal range of motion.     Right lower leg: No edema.  Skin:    General: Skin is warm and dry.  Neurological:     General: No focal deficit present.     Mental Status: He is alert and oriented to person, place, and time.  Psychiatric:        Mood and Affect: Affect normal. Mood is anxious.        Behavior: Behavior normal.    EKG: personally reviewed my  interpretation is normal rate, sinus rhythm, non-specific T wave inversions, and no overt ischemic changes  CXR: personally reviewed my interpretation is sternotomy wires present, stable mild cardiomegaly, cardiomediastinal silhouette within normal limits, mild bilateral upper lobe hazy airspace opacities, no pleural effusions, and no pneunothorax  Assessment & Plan by Problem: Active Problems:   COVID-19 virus infection  Mr. Yearwood is a 44 year old M with significant PMH of heart transplant in 2008 due to dilated cardiomyopathy and anxiety, who presents for fever, shortness of breath, and generalized body aches at home and found to have a vaccine-breakthrough case of COVID-19.  COVID-19 Pt with a 5 day history of fever, dyspnea, cough, headache, generalized weakness/fatigue, and diarrhea. Tested positive for COVID on 5/10. No known exposures, had completed vaccine course on 4/8. Pt currently well appearing, on room air, and hemodynamically stable. Ambulated without documented hypoxia, but had subjective shortness of breath.  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 Immunosuppressive regimen with prograf and cellcept. Follows with [facility] annually, and per last note in Jan 2020, pt's last cath >27yrago was without obstructive disease and with probably vessel narrowing (grade 1 vasculopathy). Echo in March 2018 with normal LV systolic function, mild LVH, normal LA pressures, normal RV systolic function, trial AR, MR, PR, and TR, and no valvular stenosis. Stress echo in Oct 2018 normal without wall motion abnormalities at rest and peak stress of 13.40 METs.  - has outpatient [facility] follow-up scheduled for 5/21 for labs and echo - continue home Cellcept and Prograf  Anxiety Pt with history of OCD and anxiety on citalopram 23mdaily and clonazepam 0.23m49mID at home. Prior episodes of nausea/vomiting and abdominal pain were concerning for possible panic attacks per wife's report. Pt is endorsing anxiety at the bedside given COVID diagnosis and requesting anxiety medication, though has concerns about risks of short-acting benzos.  - received ativan in ED - resume home clonopin and celexa - add ramelteon for sleep - could consider trial of PRN hydroxyzine if needed  Diet - heart healthy Fluids - NS @ 100m71m for 10 hours DVT ppx - enoxaparin 40mg32mQ daily CODE STATUS - FULL CODE  Dispo: Admit patient to Observation with expected length of stay less than 2 midnights.  Signed: JonesLadona Horns5/10/2020, 3:30 AM  Pager: [phone]

## 2020-04-24 ENCOUNTER — Other Ambulatory Visit: Payer: Managed Care, Other (non HMO)

## 2020-04-24 ENCOUNTER — Other Ambulatory Visit (HOSPITAL_COMMUNITY): Payer: Self-pay | Admitting: *Deleted

## 2020-04-24 DIAGNOSIS — R0602 Shortness of breath: Secondary | ICD-10-CM | POA: Diagnosis not present

## 2020-04-24 LAB — COMPREHENSIVE METABOLIC PANEL
ALT: 62 U/L — ABNORMAL HIGH (ref 0–44)
AST: 72 U/L — ABNORMAL HIGH (ref 15–41)
Albumin: 3.2 g/dL — ABNORMAL LOW (ref 3.5–5.0)
Alkaline Phosphatase: 59 U/L (ref 38–126)
Anion gap: 13 (ref 5–15)
BUN: 25 mg/dL — ABNORMAL HIGH (ref 6–20)
CO2: 20 mmol/L — ABNORMAL LOW (ref 22–32)
Calcium: 8.7 mg/dL — ABNORMAL LOW (ref 8.9–10.3)
Chloride: 105 mmol/L (ref 98–111)
Creatinine, Ser: 1.3 mg/dL — ABNORMAL HIGH (ref 0.61–1.24)
GFR calc Af Amer: >60 mL/min (ref >60)
GFR calc non Af Amer: >60 mL/min (ref >60)
Glucose, Bld: 91 mg/dL (ref 70–99)
Potassium: 4.9 mmol/L (ref 3.5–5.1)
Sodium: 138 mmol/L (ref 135–145)
Total Bilirubin: 1 mg/dL (ref 0.3–1.2)
Total Protein: 6.8 g/dL (ref 6.5–8.1)

## 2020-04-24 LAB — CBC WITH DIFFERENTIAL/PLATELET
Abs Immature Granulocytes: 0.01 10*3/uL (ref 0.00–0.07)
Basophils Absolute: 0 10*3/uL (ref 0.0–0.1)
Basophils Relative: 0 %
Eosinophils Absolute: 0 10*3/uL (ref 0.0–0.5)
Eosinophils Relative: 1 %
HCT: 37.6 % — ABNORMAL LOW (ref 39.0–52.0)
Hemoglobin: 12.5 g/dL — ABNORMAL LOW (ref 13.0–17.0)
Immature Granulocytes: 0 %
Lymphocytes Relative: 35 %
Lymphs Abs: 1.1 10*3/uL (ref 0.7–4.0)
MCH: 28.9 pg (ref 26.0–34.0)
MCHC: 33.2 g/dL (ref 30.0–36.0)
MCV: 87 fL (ref 80.0–100.0)
Monocytes Absolute: 0.4 10*3/uL (ref 0.1–1.0)
Monocytes Relative: 13 %
Neutro Abs: 1.7 10*3/uL (ref 1.7–7.7)
Neutrophils Relative %: 51 %
Platelets: 165 10*3/uL (ref 150–400)
RBC: 4.32 MIL/uL (ref 4.22–5.81)
RDW: 11.9 % (ref 11.5–15.5)
WBC: 3.3 10*3/uL — ABNORMAL LOW (ref 4.0–10.5)
nRBC: 0 % (ref 0.0–0.2)

## 2020-04-24 LAB — C-REACTIVE PROTEIN: CRP: 8 mg/dL — ABNORMAL HIGH (ref <1.0)

## 2020-04-24 LAB — MAGNESIUM: Magnesium: 1.4 mg/dL — ABNORMAL LOW (ref 1.7–2.4)

## 2020-04-24 LAB — PHOSPHORUS: Phosphorus: 2.7 mg/dL (ref 2.5–4.6)

## 2020-04-24 LAB — FERRITIN: Ferritin: 904 ng/mL — ABNORMAL HIGH (ref 24–336)

## 2020-04-24 LAB — D-DIMER, QUANTITATIVE: D-Dimer, Quant: 0.45 ug/mL-FEU (ref 0.00–0.50)

## 2020-04-24 MED ORDER — LOPERAMIDE HCL 2 MG PO TABS
2.0000 mg | ORAL_TABLET | Freq: Four times a day (QID) | ORAL | 0 refills | Status: DC | PRN
Start: 2020-04-24 — End: 2022-06-03

## 2020-04-24 MED ORDER — OXYCODONE HCL 5 MG PO TABS: 10.0000 mg | ORAL_TABLET | ORAL | Status: DC | PRN

## 2020-04-24 MED ORDER — MAGNESIUM SULFATE 2 GM/50ML IV SOLN
2.0000 g | Freq: Once | INTRAVENOUS | Status: AC
  Administered 2020-04-24: 2 g via INTRAVENOUS
  Filled 2020-04-24: qty 50

## 2020-04-24 MED ORDER — HYDROMORPHONE HCL 1 MG/ML IJ SOLN: 1.0000 mg | INTRAMUSCULAR | Status: DC | PRN

## 2020-04-24 MED ORDER — ONDANSETRON HCL 4 MG PO TABS: 4.0000 mg | ORAL_TABLET | Freq: Four times a day (QID) | ORAL | 0 refills | Status: DC | PRN

## 2020-04-24 NOTE — Progress Notes (Signed)
Patient was set up for a televisist ,but ended up going to the emergency room instead.

## 2020-04-24 NOTE — Discharge Instructions (Signed)
You are scheduled for an outpatient infusion of Remdesivir at 10am on Thursday 5/13, Friday 5/14, and Saturday 5/15.  Please report to [facility] at [address]  Drive to the security guard and tell them you are here for an infusion. They will direct you to the front entrance where we will come and get you.  For questions call [phone].  Thanks

## 2020-04-24 NOTE — Discharge Summary (Signed)
Name: Shawn Calderon MRN: [id number] DOB: 12-Aug-1976 44 y.o. PCP: Shawn Cloud, MD  Date of Admission: 04/22/2020  9:40 PM Date of Discharge: 04/24/2020 Attending Physician: Shawn Poll, MD  Discharge Diagnosis: 1. COVID-19 infection  2. AKI 3. Transaminitis  Discharge Medications: Allergies as of 04/24/2020      Reactions   Amoxicillin-pot Clavulanate Diarrhea   Clindamycin Other (See Comments)   Heartburn   Sulfamethoxazole Hives, Rash      Medication List    STOP taking these medications   ciprofloxacin 500 MG tablet Commonly known as: CIPRO   HYDROcodone-acetaminophen 5-325 MG tablet Commonly known as: NORCO/VICODIN   ondansetron 4 MG disintegrating tablet Commonly known as: Zofran ODT     TAKE these medications   acetaminophen 500 MG tablet Commonly known as: TYLENOL Take 1,000 mg by mouth 2 (two) times daily as needed for mild pain, fever or headache.   citalopram 20 MG tablet Commonly known as: CELEXA Take 30 mg by mouth at bedtime.   clonazePAM 0.5 MG tablet Commonly known as: KLONOPIN Take 1 mg by mouth 2 (two) times daily as needed for anxiety.   dicyclomine 10 MG capsule Commonly known as: Bentyl Take 1 capsule (10 mg total) by mouth 4 (four) times daily -  before meals and at bedtime.   fluticasone 50 MCG/ACT nasal spray Commonly known as: FLONASE Place 2 sprays into both nostrils as needed for allergies.   loperamide 2 MG tablet Commonly known as: Imodium A-D Take 1 tablet (2 mg total) by mouth 4 (four) times daily as needed for diarrhea or loose stools.   loratadine 10 MG tablet Commonly known as: CLARITIN Take 10 mg by mouth daily.   multivitamin with minerals Tabs tablet Take 1 tablet by mouth daily.   mycophenolate 500 MG tablet Commonly known as: CELLCEPT Take 1,500 mg by mouth every 12 (twelve) hours.   ondansetron 4 MG tablet Commonly known as: ZOFRAN Take 1 tablet (4 mg total) by mouth every 6 (six) hours as needed  for nausea or vomiting.   rosuvastatin 20 MG tablet Commonly known as: CRESTOR Take 20 mg by mouth at bedtime.   tacrolimus 1 MG capsule Commonly known as: PROGRAF Take 2 mg by mouth 2 (two) times daily.      Disposition and follow-up:   [name] was discharged from [facility] in [place] condition.  At the hospital follow up visit please address:  1. COVID-19 infection: Status post 2 doses of COVID-19 vaccine as of 4/8, and the patient still tested positive on 5/10.  No known exposures.  Likely secondary to chronic immunosuppression with tacrolimus and CellCept.  Patient received 2 days of remdesivir with plan to continue outpatient infusion.  Patient currently endorses frequent diarrhea but is able to maintain p.o. hydration. -Imodium 4 times daily as needed prescribed -f/u with PCP  2. AKI: 1.39 from baseline of 1.24. Improved with IVF and PO fluid intake. On day of discharge Cr was 1.3 -F/u CMP   3. Transaminitis: AST ALT slightly increased to 72 and 62 respectively on day of discharge. Likely in the setting of dehydration and we expect that to improve, but will need outpatient monitoring. -F/u CMP  2.  Labs / imaging needed at time of follow-up: CMP  3.  Pending labs/ test needing follow-up: None  Follow-up Appointments:   Hospital Course by problem list: 1. COVID-19 infection 2. AKI 3. Transaminitis  In summary, [name] is a 44 year old male with  a past medical history significant for heart transplant in 2008 secondary to dilated cardiomyopathy on tacrolimus and CellCept and anxiety who presented with a 5-day history of fever, shortness of breath on exertion, watery diarrhea and generalized body aches.  The patient tested positive for COVID-19 and was admitted for IV hydration and remdesivir. The patient had a mild AKI with Cr elevated to 1.39 which improved to 1.3 on the day of discharge. He also had a mild transaminitis AST ALT 72/62. The  patient's O2 saturations maintained well into the 90s during his admission. He ambulated with the nursing staff and maintained his saturations well into the 90s without supplemental O2.  The patient completed 2 days of remdesivir in the hospital, and was discharged with the plan to continue 3 additional days at the infusion center.  Although the patient continued to have diarrhea, he was able to tolerate p.o. fluids.  We reinforced how important it was for him to continue hydration as he continues to recover.  The patient was discharged with the plan of continuing infusions as well as follow-up with PCP in the outpatient setting.  Discharge Vitals:   BP 106/76   Pulse 74   Temp 98.3 F (36.8 C) (Oral)   Resp (!) 24   Ht 5\' 10"  (1.778 m)   Wt 88.5 kg   SpO2 (!) 89%   BMI 27.98 kg/m   Pertinent Labs, Studies, and Procedures:  CBC Latest Ref Rng & Units 04/24/2020 04/23/2020 04/22/2020  WBC 4.0 - 10.5 K/uL 3.3(L) 3.5(L) 3.6(L)  Hemoglobin 13.0 - 17.0 g/dL 12.5(L) 12.7(L) 13.1  Hematocrit 39.0 - 52.0 % 37.6(L) 39.1 39.1  Platelets 150 - 400 K/uL 165 155 192   CMP Latest Ref Rng & Units 04/24/2020 04/22/2020 04/01/2020  Glucose 70 - 99 mg/dL 91 95 04/03/2020)  BUN 6 - 20 mg/dL 277(A) 12(I) 18  Creatinine 0.61 - 1.24 mg/dL 78(M) 7.67(M) 0.94(B)  Sodium 135 - 145 mmol/L 138 136 139  Potassium 3.5 - 5.1 mmol/L 4.9 4.2 3.9  Chloride 98 - 111 mmol/L 105 105 104  CO2 22 - 32 mmol/L 20(L) 20(L) 22  Calcium 8.9 - 10.3 mg/dL 0.96(G) 8.3(M) 9.9  Total Protein 6.5 - 8.1 g/dL 6.8 7.1 7.0  Total Bilirubin 0.3 - 1.2 mg/dL 1.0 1.0 6.2(H)  Alkaline Phos 38 - 126 U/L 59 65 59  AST 15 - 41 U/L 72(H) 55(H) 33  ALT 0 - 44 U/L 62(H) 50(H) 25   CXR: IMPRESSION: Multifocal hazy airspace opacities within the bilateral upper lobes which could be due to multifocal pneumonia.  Discharge Instructions: Discharge Instructions    Call MD for:  difficulty breathing, headache or visual disturbances   Complete by: As  directed    Call MD for:  extreme fatigue   Complete by: As directed    Call MD for:  hives   Complete by: As directed    Call MD for:  persistant dizziness or light-headedness   Complete by: As directed    Call MD for:  persistant nausea and vomiting   Complete by: As directed    Call MD for:  severe uncontrolled pain   Complete by: As directed    Call MD for:  temperature >100.4   Complete by: As directed    Diet - low sodium heart healthy   Complete by: As directed    Discharge instructions   Complete by: As directed    Thank you for allowing 4.7(M to take care of you  during your hospitalization.  Below is a summary of what we treated:  1.  COVID-19 infection -You will continue getting remdesivir infusions in the outpatient setting.  The infusion center will call you to schedule your appointment.  2.  Follow-up -Please go to your remdesivir infusion appointments. -Schedule a follow-up appointment with the internal medicine clinic so we can monitor your progress within the next 2 weeks.   Increase activity slowly   Complete by: As directed      Signed: Earlene Plater, MD Internal Medicine, PGY1 Pager: [phone]  04/24/2020,12:48 PM

## 2020-04-24 NOTE — Progress Notes (Signed)
Patient scheduled for outpatient Remdesivir infusion at 10am on Thursday 5/13, Friday 5/14, and Saturday 5/15. Please advise them to report to [facility] at [address].  Drive to the security guard and tell them you are here for an infusion. They will direct you to the front entrance where we will come and get you.  For questions call [phone].  Thanks   

## 2020-04-24 NOTE — Progress Notes (Signed)
SATURATION QUALIFICATIONS: (This note is used to comply with regulatory documentation for home oxygen)  Patient Saturations on Room Air at Rest = 97%  Patient Saturations on Room Air while Ambulating = 91%  Please briefly explain why patient needs home oxygen: Pt does not meet requirements of needing home O2

## 2020-04-24 NOTE — Progress Notes (Signed)
Subjective:   Pt was seen at the bedside today.  Patient states that he is continuing to have frequent watery diarrhea, but feels like he is drinking enough fluids to keep up.  He denies any nausea or vomiting, and states that he is able to keep his food and drinks down without issues.  He denies SOB as well.  The patient is amendable to receiving outpatient remdesivir infusions and continuing his recovery at home.  He request that we call his wife to see how she feels about it.  I spoke to the wife for roughly 10 minutes about the treatment plan in the outpatient setting, as well as return precautions.  She is comfortable with the plan for discharge today as well.  Objective: CBC Latest Ref Rng & Units 04/24/2020 04/23/2020 04/22/2020  WBC 4.0 - 10.5 K/uL 3.3(L) 3.5(L) 3.6(L)  Hemoglobin 13.0 - 17.0 g/dL 12.5(L) 12.7(L) 13.1  Hematocrit 39.0 - 52.0 % 37.6(L) 39.1 39.1  Platelets 150 - 400 K/uL 165 155 192   BMP Latest Ref Rng & Units 04/24/2020 04/22/2020 04/01/2020  Glucose 70 - 99 mg/dL 91 95 542(H)  BUN 6 - 20 mg/dL 06(C) 37(S) 18  Creatinine 0.61 - 1.24 mg/dL 2.83(T) 5.17(O) 1.60(V)  Sodium 135 - 145 mmol/L 138 136 139  Potassium 3.5 - 5.1 mmol/L 4.9 4.2 3.9  Chloride 98 - 111 mmol/L 105 105 104  CO2 22 - 32 mmol/L 20(L) 20(L) 22  Calcium 8.9 - 10.3 mg/dL 3.7(T) 0.6(Y) 9.9   Vital signs in last 24 hours: Vitals:   04/23/20 1543 04/23/20 2000 04/24/20 0000 04/24/20 0400  BP: 114/77 (!) 148/94 (!) 136/91 120/86  Pulse: 72 76 83 78  Resp: (!) 24 19 (!) 21 (!) 22  Temp: 99 F (37.2 C) 99 F (37.2 C) 98.8 F (37.1 C) 99 F (37.2 C)  TempSrc: Oral Oral Oral Axillary  SpO2: 91% 96% 96% 92%  Weight:      Height:       Physical Exam General: Comfortable appearing, NAD HEENT: NCAT CV: RRR, normal S1 and S2 no murmurs rubs or gallops appreciated PULM: Clear to auscultation bilaterally ABD: Soft and nontender NEURO: Alert and oriented, nonfocal  Assessment/Plan:  Active  Problems:   COVID-19 virus infection  In summary, Shawn Calderon is a 44 year old gentleman with a past medical history significant for heart transplant 2008 due to dilated cardiomyopathy (on tacrolimus and CellCept) and anxiety who presented with a 5-day history of fever, SOB on exertion, diarrhea and generalized body aches.  Although the patient has been fully vaccinated, he tested positive for COVID-19.  #COVID-19 #Diarrhea: Despite being fully vaccinated as of 4/8, the patient still tested positive for COVID-19 on 5/10. He has no known exposures.  I suspect his immune suppressive state with CellCept and tacrolimus likely did not result of a robust immune response. His respiratory status is stable, saturating in the upper 90s on room air, however he continues to have multiple episodes of diarrhea and nausea. 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  Patient currently takes tacrolimus and myocphenylate.  His transplant care has been stable, and follows up with [facility] annually.  He has a follow-up appointment scheduled  for 5/21 currently. -Mycophenolate 1500 mg every 12 hours -Tacrolimus 2 mg twice daily -Rosuvastatin 20 mg daily   #Anxiety: Patient takes citalopram and clonazepam 0.5 mg twice daily at home. -Citalopram 30 mg daily -Clonazepam 1 mg twice daily PRN -Atarax 35 mg q4hrs PRN  #FEN/GI -Diet: Heart healthy -Fluids: none -Zofran 4 mg q6hrs PRN  #DVT prophylaxis -Lovenox 40 mg subcu injections daily  #CODE STATUS: FULL  #Dispo: Anticipated discharge today  Prior to Admission Living Arrangement: Home Anticipated Discharge Location: Home  Barriers to  Discharge: None  Shawn Plater, MD Internal Medicine, PGY1 Pager: [phone]  04/24/2020,7:34 AM

## 2020-04-24 NOTE — Progress Notes (Signed)
Pt given discharge instructions, prescriptions from pharmacy, and care notes. Pt informed that per MD he needs to quarantine until 10 days after he is asymptomatic. Pt verbalized understanding AEB no further questions or concerns at this time. IV was discontinued, no redness, pain, or swelling noted at this time. Telemetry discontinued and Centralized Telemetry was notified. Pt left the floor via wheelchair with staff in stable condition.

## 2020-04-25 ENCOUNTER — Ambulatory Visit (HOSPITAL_COMMUNITY)
Admit: 2020-04-25 | Discharge: 2020-04-25 | Disposition: A | Discharge status: Home / Self Care | Payer: 59 | Source: Ambulatory Visit | Attending: Pulmonary Disease | Admitting: Pulmonary Disease

## 2020-04-25 DIAGNOSIS — J1282 Pneumonia due to coronavirus disease 2019: Secondary | ICD-10-CM | POA: Insufficient documentation

## 2020-04-25 DIAGNOSIS — U071 COVID-19: Secondary | ICD-10-CM | POA: Insufficient documentation

## 2020-04-25 LAB — CULTURE, BLOOD (ROUTINE X 2)
Special Requests: ADEQUATE
Special Requests: ADEQUATE

## 2020-04-25 MED ORDER — METHYLPREDNISOLONE SODIUM SUCC 125 MG IJ SOLR: 125.0000 mg | Freq: Once | INTRAMUSCULAR | Status: DC | PRN

## 2020-04-25 MED ORDER — DIPHENHYDRAMINE HCL 50 MG/ML IJ SOLN: 50.0000 mg | Freq: Once | INTRAMUSCULAR | Status: DC | PRN

## 2020-04-25 MED ORDER — EPINEPHRINE 0.3 MG/0.3ML IJ SOAJ: 0.3000 mg | Freq: Once | INTRAMUSCULAR | Status: DC | PRN

## 2020-04-25 MED ORDER — ALBUTEROL SULFATE HFA 108 (90 BASE) MCG/ACT IN AERS: 2.0000 | INHALATION_SPRAY | Freq: Once | RESPIRATORY_TRACT | Status: DC | PRN

## 2020-04-25 MED ORDER — FAMOTIDINE IN NACL 20-0.9 MG/50ML-% IV SOLN: 20.0000 mg | Freq: Once | INTRAVENOUS | Status: DC | PRN

## 2020-04-25 MED ORDER — SODIUM CHLORIDE 0.9 % IV SOLN: INTRAVENOUS | Status: DC | PRN

## 2020-04-25 MED ORDER — SODIUM CHLORIDE 0.9 % IV SOLN
100.0000 mg | Freq: Once | INTRAVENOUS | Status: AC
  Administered 2020-04-25: 100 mg via INTRAVENOUS
  Filled 2020-04-25: qty 20

## 2020-04-25 NOTE — Discharge Instructions (Signed)

## 2020-04-25 NOTE — Progress Notes (Signed)
  Diagnosis: COVID-19  Physician: Dr. Wright  Procedure: [facility] Med: remdesivir infusion - Provided patient with remdesivir fact sheet for patients, parents and caregivers prior to infusion.  Complications: No immediate complications noted.  Discharge: Discharged home   Shawn Calderon S [name] 04/25/2020  

## 2020-04-26 ENCOUNTER — Ambulatory Visit (HOSPITAL_COMMUNITY)
Admit: 2020-04-26 | Discharge: 2020-04-26 | Disposition: A | Discharge status: Home / Self Care | Payer: 59 | Attending: Pulmonary Disease | Admitting: Pulmonary Disease

## 2020-04-26 DIAGNOSIS — U071 COVID-19: Secondary | ICD-10-CM | POA: Diagnosis not present

## 2020-04-26 MED ORDER — METHYLPREDNISOLONE SODIUM SUCC 125 MG IJ SOLR: 125.0000 mg | Freq: Once | INTRAMUSCULAR | Status: DC | PRN

## 2020-04-26 MED ORDER — DIPHENHYDRAMINE HCL 50 MG/ML IJ SOLN: 50.0000 mg | Freq: Once | INTRAMUSCULAR | Status: DC | PRN

## 2020-04-26 MED ORDER — ALBUTEROL SULFATE HFA 108 (90 BASE) MCG/ACT IN AERS: 2.0000 | INHALATION_SPRAY | Freq: Once | RESPIRATORY_TRACT | Status: DC | PRN

## 2020-04-26 MED ORDER — SODIUM CHLORIDE 0.9 % IV SOLN
100.0000 mg | Freq: Once | INTRAVENOUS | Status: AC
  Administered 2020-04-26: 100 mg via INTRAVENOUS
  Filled 2020-04-26: qty 20

## 2020-04-26 MED ORDER — FAMOTIDINE IN NACL 20-0.9 MG/50ML-% IV SOLN: 20.0000 mg | Freq: Once | INTRAVENOUS | Status: DC | PRN

## 2020-04-26 MED ORDER — SODIUM CHLORIDE 0.9 % IV SOLN: INTRAVENOUS | Status: DC | PRN

## 2020-04-26 MED ORDER — EPINEPHRINE 0.3 MG/0.3ML IJ SOAJ: 0.3000 mg | Freq: Once | INTRAMUSCULAR | Status: DC | PRN

## 2020-04-26 NOTE — Discharge Instructions (Signed)

## 2020-04-26 NOTE — Progress Notes (Signed)
  Diagnosis: COVID-19  Physician: Dr. Delford Field  Procedure: [facility] Med: remdesivir infusion - Provided patient with remdesivir fact sheet for patients, parents and caregivers prior to infusion.  Complications: No immediate complications noted.  Discharge: Discharged home   Shawn Calderon 04/26/2020

## 2020-04-27 ENCOUNTER — Ambulatory Visit (HOSPITAL_COMMUNITY)
Admit: 2020-04-27 | Discharge: 2020-04-27 | Disposition: A | Discharge status: Home / Self Care | Payer: 59 | Attending: Pulmonary Disease | Admitting: Pulmonary Disease

## 2020-04-27 ENCOUNTER — Other Ambulatory Visit: Payer: Self-pay | Admitting: Internal Medicine

## 2020-04-27 DIAGNOSIS — U071 COVID-19: Secondary | ICD-10-CM

## 2020-04-27 MED ORDER — ALBUTEROL SULFATE HFA 108 (90 BASE) MCG/ACT IN AERS: 2.0000 | INHALATION_SPRAY | Freq: Once | RESPIRATORY_TRACT | Status: DC | PRN

## 2020-04-27 MED ORDER — METHYLPREDNISOLONE SODIUM SUCC 125 MG IJ SOLR: 125.0000 mg | Freq: Once | INTRAMUSCULAR | Status: DC | PRN

## 2020-04-27 MED ORDER — DIPHENHYDRAMINE HCL 50 MG/ML IJ SOLN: 50.0000 mg | Freq: Once | INTRAMUSCULAR | Status: DC | PRN

## 2020-04-27 MED ORDER — EPINEPHRINE 0.3 MG/0.3ML IJ SOAJ: 0.3000 mg | Freq: Once | INTRAMUSCULAR | Status: DC | PRN

## 2020-04-27 MED ORDER — FAMOTIDINE IN NACL 20-0.9 MG/50ML-% IV SOLN: 20.0000 mg | Freq: Once | INTRAVENOUS | Status: DC | PRN

## 2020-04-27 MED ORDER — SODIUM CHLORIDE 0.9 % IV SOLN: INTRAVENOUS | Status: DC | PRN

## 2020-04-27 MED ORDER — SODIUM CHLORIDE 0.9 % IV SOLN
100.0000 mg | Freq: Once | INTRAVENOUS | Status: AC
  Administered 2020-04-27: 100 mg via INTRAVENOUS
  Filled 2020-04-27: qty 20

## 2020-04-27 NOTE — Progress Notes (Signed)
  Diagnosis: COVID-19  Physician:Dr Wright  Procedure: [facility] Med: remdesivir infusion - Provided patient with remdesivir fact sheet for patients, parents and caregivers prior to infusion.  Complications: No immediate complications noted.  Discharge: Discharged home   Shawn Calderon 04/27/2020   

## 2020-05-05 ENCOUNTER — Encounter: Payer: Self-pay | Admitting: Internal Medicine

## 2020-06-04 ENCOUNTER — Other Ambulatory Visit: Payer: Self-pay

## 2020-06-04 ENCOUNTER — Ambulatory Visit: Payer: 59 | Admitting: Internal Medicine

## 2020-06-04 ENCOUNTER — Encounter: Payer: Self-pay | Admitting: Internal Medicine

## 2020-06-04 DIAGNOSIS — H938X2 Other specified disorders of left ear: Secondary | ICD-10-CM

## 2020-06-04 NOTE — Progress Notes (Addendum)
° °  CC: Fullness of left ear  HPI:  [name] is a 44 y.o. male with PMHx as documented below, presented with ear pain. Please refer to problem based charting for further details and assessment and plan of current problem and chronic medical conditions.  PMHx: Heart transplant recipient, HLD, HTN, OCD, COVID-19 infection Medication history: Citalopram, CellCept, tacrolimus, rosuvastatin   Past Medical History:  Diagnosis Date   Heart transplant recipient [facility]) 2008   Review of Systems:  *Review of Systems  Constitutional: Negative for chills and fever.  HENT: Negative for ear pain, hearing loss and sore throat.        Left ear fullness  Cardiovascular: Negative for chest pain and palpitations.  Neurological: Negative for headaches.     Physical Exam:  Vitals:   06/04/20 1054  BP: 128/90  Pulse: 81  Temp: 98.6 F (37 C)  TempSrc: Oral  SpO2: 98%  Weight: 199 lb 12.8 oz (90.6 kg)  Height: 5\' 10"  (1.778 m)  Physical Exam Constitutional:      Appearance: Normal appearance.  HENT:     Left Ear: There is impacted cerumen.     Mouth/Throat:     Mouth: Mucous membranes are moist.     Pharynx: Oropharynx is clear.  Cardiovascular:     Rate and Rhythm: Normal rate and regular rhythm.     Heart sounds: No murmur heard.   Pulmonary:     Effort: Pulmonary effort is normal.     Breath sounds: Normal breath sounds. No wheezing or rales.  Abdominal:     Palpations: Abdomen is soft.  Musculoskeletal:     Right lower leg: No edema.     Left lower leg: No edema.  Neurological:     General: No focal deficit present.     Mental Status: He is alert and oriented to person, place, and time.  Psychiatric:        Mood and Affect: Mood normal.        Behavior: Behavior normal.        Thought Content: Thought content normal.     Assessment & Plan:   See Encounters Tab for problem based charting.  Patient discussed with Dr. 

## 2020-06-04 NOTE — Patient Instructions (Signed)
It was our pleasure taking care of you in our clinic today.  You were seen due to fullness of left ear.  Your examination showed impacted cerumen. No evidence of infection.  We performed ear irrigation and removed most of the cerumen from your ear.  Below you can find some information about ear irrigation.  We did not make any changes in your medications. Please take them as before. Please follow up with your primary care. As always, if having severe symptoms, please seek medical attention at emergency room or ENT. Please feel free to contact us at [phone] if you have any questions or concern.  Thank you      Ear Irrigation Ear irrigation is a procedure to wash dirt and wax out of your ear canal. This procedure is also called lavage. You may need ear irrigation if you are having trouble hearing because of a buildup of earwax. You may also have ear irrigation as part of the treatment for an ear infection. Getting wax and dirt out of your ear canal can help some medicines (ear drops) work better. How is ear irrigation performed? The procedure may vary among health care providers and hospitals. In general:  You may be given ear drops to put in your ear 15-20 minutes before irrigation. This helps loosen the wax.  A syringe containing water or a sterile salt solution (saline) can be gently inserted into the ear canal. The saline is used to flush out wax and other debris. Ear irrigation kits are also available for use at home. Ask your health care provider if this is an option for you. Use a home irrigation kit only as told by your health care provider. Read the package instructions carefully. Follow the directions for using the syringe. Use water that is room temperature. Do not do ear irrigation at home if you:  Have diabetes. Diabetes increases the risk of infection.  Have a hole or tear in your eardrum.  Have tubes in your ears.  Have had any ear surgery in the past.  Have been  instructed not to irrigate your ears. What are the risks of ear irrigation? Generally, this is a safe procedure. However, problems may occur, including:  Infection.  Pain.  Hearing loss.  Pushing water and debris into the eardrum. This can occur if there are holes in the eardrum.  Ear irrigation failing to work. How should I care for my ears after irrigation? After an ear irrigation, follow instructions given to you by your healthcare provider. Cleaning   Clean the outside of your ear with a soft washcloth daily.  If told by your health care provider, use a few drops of baby oil, mineral oil, glycerin, hydrogen peroxide, or over-the-counter earwax softening drops.  Do not use cotton swabs to clean your ears. These can push wax down into the ear canal.  Do not put anything into your ears to try to remove wax. This includes ear candles. General instructions  Take over-the-counter and prescription medicines only as told by your health care provider.  If you were prescribed an antibiotic medicine, use it as told by your health care provider. Do not stop using the antibiotic even if your condition improves.  Keep all follow-up visits as told by your health care provider. This is important.  Visit your health care provider at least once a year to have your ears and hearing checked. Follow these instructions at home:  Keep the ear clean and dry by following the instructions  from your healthcare provider. Contact a health care provider if:  Your hearing is not improving or is getting worse.  You have pain or redness in your ear.  You are dizzy.  You have ringing in your ears.  You have nausea or vomiting.  You have fluid, blood, or pus coming out of your ear. Summary  Ear irrigation is a procedure to wash dirt and wax out of your ear canal. This procedure is also called lavage.  To perform ear irrigation, ear drops may be put in your ear 15-20 minutes before irrigation.  Water or sterile salt solution (saline) will be used to flush out wax and other debris.  You may be able to irrigate your ears at home. Ask your health care provider if this is an option for you. Follow your health care provider's instructions.  Clean your ears with a soft cloth after irrigation. Do not use cotton swabs to clean your ears. These can push wax down into the ear canal. This information is not intended to replace advice given to you by your health care provider. Make sure you discuss any questions you have with your health care provider. Document Revised: 08/29/2018 Document Reviewed: 08/29/2018 Elsevier Patient Education  [place].

## 2020-06-04 NOTE — Progress Notes (Signed)
Internal Medicine Clinic Attending  Case discussed with Dr. Masoudi  at the time of the visit.  We reviewed the resident's history and exam and pertinent patient test results.  I agree with the assessment, diagnosis, and plan of care documented in the resident's note.  

## 2020-06-04 NOTE — Assessment & Plan Note (Addendum)
Patient presented with complaint of left ear fullness.  It started yesterday.  Has not affected his hearing yet.  Denies any fever, chills, earache, sore throat. Reports that he has history of recurrent impacted cerumen and usually goes to ENT office for irrigation.  This time, he was not able to get an early appointment. On exam, he is afebrile.  He has impacted cerumen left ear.  No tenderness on external auricle and no lymphadenopathy in the area. Throat exam is normal without erythema or exudate. -Removed some of the cerumen with loop and performed left ear irrigation today. -Tympanic membrane examined after irrigation and is intact.

## 2020-09-04 ENCOUNTER — Encounter: Payer: Self-pay | Admitting: Internal Medicine

## 2020-09-04 ENCOUNTER — Ambulatory Visit (INDEPENDENT_AMBULATORY_CARE_PROVIDER_SITE_OTHER): Payer: 59 | Admitting: Internal Medicine

## 2020-09-04 ENCOUNTER — Other Ambulatory Visit: Payer: Self-pay

## 2020-09-04 ENCOUNTER — Encounter: Payer: Self-pay | Admitting: Gastroenterology

## 2020-09-04 VITALS — BP 130/86 | HR 98 | Temp 98.5°F | Wt 199.8 lb

## 2020-09-04 DIAGNOSIS — K529 Noninfective gastroenteritis and colitis, unspecified: Secondary | ICD-10-CM | POA: Diagnosis not present

## 2020-09-04 NOTE — Progress Notes (Signed)
   CC: Chronic Diarrhea   HPI:  [name] is a 44 y.o. male, with a PMH below, who presents to the clinic for follow up on his chronic diarrhea. To see the management of his acute and chronic conditions, please refer to the a&p note under the encounters tab.   Past Medical History:  Diagnosis Date  . Heart transplant recipient [facility]) 2008   Review of Systems:   Review of Systems  Constitutional: Negative for chills, fever, malaise/fatigue and weight loss.  Gastrointestinal: Positive for diarrhea. Negative for blood in stool, constipation, melena, nausea and vomiting.       Bloating  Neurological: Negative for dizziness and headaches.    Physical Exam:  Vitals:   09/04/20 1353  BP: 130/86  Pulse: 98  Temp: 98.5 F (36.9 C)  TempSrc: Oral  SpO2: 99%  Weight: 199 lb 12.8 oz (90.6 kg)   Physical Exam Vitals and nursing note reviewed.  Constitutional:      General: He is not in acute distress.    Appearance: Normal appearance. He is not ill-appearing or toxic-appearing.  HENT:     Head: Normocephalic and atraumatic.  Eyes:     General:        Right eye: No discharge.        Left eye: No discharge.     Conjunctiva/sclera: Conjunctivae normal.  Cardiovascular:     Rate and Rhythm: Normal rate and regular rhythm.     Pulses: Normal pulses.     Heart sounds: Normal heart sounds. No murmur heard.  No friction rub. No gallop.   Pulmonary:     Effort: Pulmonary effort is normal.     Breath sounds: Normal breath sounds. No wheezing, rhonchi or rales.  Abdominal:     General: Bowel sounds are normal. There is no distension.     Palpations: Abdomen is soft. There is no mass.     Tenderness: There is no abdominal tenderness. There is no guarding or rebound.     Hernia: No hernia is present.  Musculoskeletal:        General: No swelling.     Right lower leg: No edema.     Left lower leg: No edema.  Neurological:     Mental Status: He is alert.     Assessment &  Plan:   See Encounters Tab for problem based charting.  Patient discussed with Dr. Mayford Knife

## 2020-09-04 NOTE — Patient Instructions (Addendum)
To Mr. Schweitzer,  It was a pleasure meeting you today! Today we discussed your abdominal bloating and diarrhea. We will put in an order for a scope for colonoscopy and a referral to the gastroenterologist for follow up to evaluate your diarrhea. We will see you back in 3 months time!  Sincerely,  Dolan Amen, MD

## 2020-09-05 DIAGNOSIS — K529 Noninfective gastroenteritis and colitis, unspecified: Secondary | ICD-10-CM | POA: Insufficient documentation

## 2020-09-05 LAB — CBC WITH DIFFERENTIAL/PLATELET
Basophils Absolute: 0 10*3/uL (ref 0.0–0.2)
Basos: 1 %
EOS (ABSOLUTE): 0.1 10*3/uL (ref 0.0–0.4)
Eos: 2 %
Hematocrit: 40.6 % (ref 37.5–51.0)
Hemoglobin: 13.8 g/dL (ref 13.0–17.7)
Immature Grans (Abs): 0 10*3/uL (ref 0.0–0.1)
Immature Granulocytes: 0 %
Lymphocytes Absolute: 2.4 10*3/uL (ref 0.7–3.1)
Lymphs: 37 %
MCH: 28.9 pg (ref 26.6–33.0)
MCHC: 34 g/dL (ref 31.5–35.7)
MCV: 85 fL (ref 79–97)
Monocytes Absolute: 0.5 10*3/uL (ref 0.1–0.9)
Monocytes: 7 %
Neutrophils Absolute: 3.6 10*3/uL (ref 1.4–7.0)
Neutrophils: 53 %
Platelets: 246 10*3/uL (ref 150–450)
RBC: 4.78 x10E6/uL (ref 4.14–5.80)
RDW: 12.2 % (ref 11.6–15.4)
WBC: 6.7 10*3/uL (ref 3.4–10.8)

## 2020-09-05 LAB — VITAMIN B12: Vitamin B-12: 930 pg/mL (ref 232–1245)

## 2020-09-05 NOTE — Assessment & Plan Note (Addendum)
Patient presents to the clinic for chronic diarrhea. He states his symptoms started in April of 2021. He contracted COVID 19 the following month which made his symptoms worse. He states that approximately twice weekly he will start to become bloated at 8:00 pm, and several hours later have abundant amounts of diarrhea. He eats a majority gluten free diet as his wife has celiac's disease, allows himself a bagel in the morning. There is no associated abdominal pain. He is a Ambulance person. He does eat a wide variety of vegetables. There is no specific trigger for his symptoms, and the days vary. He cannot track a certain food to his symptoms. He has tried Bentyl and imodium which provides little relief. He has been on the same medications for years.   Patient presents with frequent occurences of BMs weekly. The occurences are random, but seem to begin at 8:00 pm. The description of his symptoms does not align with secretory or osmotic diarrhea, will not get labs at this time to differentiate as it does not fit the clinical picture. No family history of GI disorders. 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 Will order a colonoscopy, GI referral and labs for other causes of diarrhea.  - GI referral  - Colonoscopy ordered - B12, CBC w/ diff

## 2020-09-06 ENCOUNTER — Encounter: Payer: Self-pay | Admitting: Internal Medicine

## 2020-09-09 NOTE — Progress Notes (Signed)
DOS 09/04/20:  Internal Medicine Clinic Attending  Case discussed with Dr. Winters  At the time of the visit.  We reviewed the resident's history and exam and pertinent patient test results.  I agree with the assessment, diagnosis, and plan of care documented in the resident's note.  

## 2020-09-24 ENCOUNTER — Telehealth: Payer: Self-pay

## 2020-09-24 NOTE — Telephone Encounter (Signed)
Sounds good. Thank you

## 2020-09-24 NOTE — Telephone Encounter (Signed)
Received TC from patient who c/o sinus infection and states s/s of green nasal drainage, hoarseness, coughing (mainly in morning after waking).  Denies any fever.  Onset was Sunday.  Telehealth appt given for tomorrow, 09/25/20 @ 3:15 w/ Dr. Marchia Bond. SChaplin, RN,BSN

## 2020-09-25 ENCOUNTER — Ambulatory Visit (INDEPENDENT_AMBULATORY_CARE_PROVIDER_SITE_OTHER): Payer: 59 | Admitting: Internal Medicine

## 2020-09-25 ENCOUNTER — Other Ambulatory Visit: Payer: Self-pay

## 2020-09-25 DIAGNOSIS — J069 Acute upper respiratory infection, unspecified: Secondary | ICD-10-CM

## 2020-09-25 DIAGNOSIS — J019 Acute sinusitis, unspecified: Secondary | ICD-10-CM

## 2020-09-25 MED ORDER — FLUTICASONE PROPIONATE 50 MCG/ACT NA SUSP: 2.0000 | NASAL | 0 refills | Status: DC | PRN

## 2020-09-25 NOTE — Progress Notes (Signed)
I connected with  [name] on 09/26/20 by a video enabled telemedicine application and verified that I am speaking with the correct person using two identifiers.   I discussed the limitations of evaluation and management by telemedicine. The patient expressed understanding and agreed to proceed.    CC: URI/Sinusitis  This is a telephone encounter between [name] and Dellia Cloud on 09/26/2020 for URI. The visit was conducted with the patient located at home and Dellia Cloud at [facility]. The patient's identity was confirmed using their DOB and current address. The patient has consented to being evaluated through a telephone encounter and understands the associated risks (an examination cannot be done and the patient may need to come in for an appointment) / benefits (allows the patient to remain at home, decreasing exposure to coronavirus). I personally spent 8 minutes on medical discussion.   HPI:  Mr.Edras B Rosiles is a 44 y.o. with PMH as below.   Please see A&P for assessment of the patient's acute and chronic medical conditions.   Past Medical History:  Diagnosis Date  . 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    Assessment & Plan:   See Encounters Tab for problem based charting.  Patient discussed with Dr. Mayford Knife

## 2020-09-26 ENCOUNTER — Encounter: Payer: Self-pay | Admitting: Internal Medicine

## 2020-09-26 DIAGNOSIS — J069 Acute upper respiratory infection, unspecified: Secondary | ICD-10-CM | POA: Insufficient documentation

## 2020-09-26 NOTE — Assessment & Plan Note (Signed)
Patient presents today for a telehealth visit (phone) for nonspecific upper respiratory infection.  Patient states that this started roughly 3 weeks ago when he was laying grass seed and hay.  He subsequently developed rhinorrhea.  He states that he recently traveled over the last week for vacation.  Since then he has had nasal congestion with production of green mucus, and cough.  He denies any shortness of breath, fevers, myalgias, chills.  He has not been around any sick contacts.  He has tried Mucinex, Flonase, and Nettie pot to improve his symptoms.  I counseled him regarding the likelihood of nonspecific viral upper respiratory infection/sinusitis.  Patient does have a pertinent past medical history of heart transplant on immunosuppressive medications.  I counseled him on the importance of continuing conservative management with nasal steroid, antihistamines, and Tylenol.  I instructed him to irrigate his nose and use Afrin for up to 3 days to help clear the mucus from his sinuses.  I informed him that if his symptoms do not improve by Friday to reach out to our clinic and we would prescribe him an antibiotic for his infection.  Plan: -Continue conservative management outlined above -We will prescribe an antibiotic if his symptoms persist to Friday.

## 2020-09-27 ENCOUNTER — Encounter: Payer: Self-pay | Admitting: Internal Medicine

## 2020-09-27 ENCOUNTER — Other Ambulatory Visit: Payer: Self-pay | Admitting: Internal Medicine

## 2020-09-27 DIAGNOSIS — J019 Acute sinusitis, unspecified: Secondary | ICD-10-CM

## 2020-09-27 MED ORDER — AMOXICILLIN-POT CLAVULANATE 875-125 MG PO TABS: 1.0000 | ORAL_TABLET | Freq: Two times a day (BID) | ORAL | 0 refills | Status: AC

## 2020-10-07 NOTE — Progress Notes (Signed)
Internal Medicine Clinic Attending  Case discussed with Dr. Coe  At the time of the visit.  We reviewed the resident's history.  I agree with the assessment, diagnosis, and plan of care documented in the resident's note.  

## 2020-10-07 NOTE — Progress Notes (Signed)
Internal Medicine Clinic Attending  Case discussed with Dr. Marchia Bond  At the time of the visit.  We reviewed the resident's history.  I agree with the assessment, diagnosis, and plan of care documented in the resident's note.

## 2020-11-04 ENCOUNTER — Ambulatory Visit: Payer: 59 | Admitting: Gastroenterology

## 2020-11-04 NOTE — Addendum Note (Signed)
Addended by: Dorie Rank E on: 11/04/2020 11:19 AM   Modules accepted: Orders

## 2020-11-26 ENCOUNTER — Telehealth: Payer: Self-pay | Admitting: Family Medicine

## 2020-11-26 NOTE — Telephone Encounter (Signed)
Spoke with patient. Had cancellation and was able to reschedule appointment tomorrow for patient.

## 2020-11-26 NOTE — Progress Notes (Signed)
Shawn Calderon [address] [place] [address] Phone: [phone] Subjective:   I Shawn Calderon am serving as a Neurosurgeon for Dr. Antoine Primas.  This visit occurred during the SARS-CoV-2 public health emergency.  Safety protocols were in place, including screening questions prior to the visit, additional usage of staff PPE, and extensive cleaning of exam room while observing appropriate contact time as indicated for disinfecting solutions.   I'm seeing this patient by the request  of:  Dellia Cloud, MD  CC: Left leg and heel pain.  GHW:[id number]  Shawn Calderon is a 44 y.o. male coming in with complaint of left calf pain. Patient states his pain is lateral.  Started with heel pain months ago.  Going to [employer]. Certain treatments caused pain. Not sure what the MOI was. States he travels a lot and is not on his feet a lot. Pain radiates to the back of his heel. Squeezing the calf is painful but on the anterior lateral aspect. Patient is using boot for a couple days which did show some improvement.. Using the RICE method for treatment. 7-8/10 at its worse. Burning sensation.       Past Medical History:  Diagnosis Date  . 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 Social History   Socioeconomic History  . Marital status: Divorced    Spouse name: Not on file  . Number of children: Not on file  . Years of education: Not on file  . Highest education level: Not on file  Occupational History  . Not on file  Tobacco Use  . Smoking status: Never Smoker  . Smokeless tobacco: Never Used  Substance and Sexual Activity  . Alcohol use: Not Currently  . Drug use: Not Currently  . Sexual activity: Not on file  Other Topics Concern  . Not on file  Social History Narrative  . Not on file   Social Determinants of Health   Financial Resource Strain: Not on file  Food Insecurity: Not on file  Transportation Needs:  Not on file  Physical Activity: Not on file  Stress: Not on file  Social Connections: Not on file   Allergies  Allergen Reactions  . Amoxicillin-Pot Clavulanate Diarrhea  . Clindamycin Other (See Comments)    Heartburn  . Sulfamethoxazole Hives and Rash   No family history on file.   Current Outpatient Medications (Cardiovascular):  .  rosuvastatin (CRESTOR) 20 MG tablet, Take 20 mg by mouth at bedtime.  Current Outpatient Medications (Respiratory):  .  loratadine (CLARITIN) 10 MG tablet, Take 10 mg by mouth daily. .  fluticasone (FLONASE) 50 MCG/ACT nasal spray, Place 2 sprays into both nostrils as needed for allergies.  Current Outpatient Medications (Analgesics):  .  acetaminophen (TYLENOL) 500 MG tablet, Take 1,000 mg by mouth 2 (two) times daily as needed for mild pain, fever or headache.   Current Outpatient Medications (Other):  .  citalopram (CELEXA) 20 MG tablet, Take 30 mg by mouth at bedtime.  .  clonazePAM (KLONOPIN) 0.5 MG tablet, Take 1 mg by mouth 2 (two) times daily as needed for anxiety. .  dicyclomine (BENTYL) 10 MG capsule, TAKE ONE CAPSULE BY MOUTH FOUR TIMES A DAY BEFORE MEAL AND AT BEDTIME .  loperamide (IMODIUM A-D) 2 MG tablet, Take 1 tablet (2 mg total) by mouth 4 (four) times daily as needed for diarrhea or loose stools. .  Multiple Vitamin (MULTIVITAMIN WITH MINERALS) TABS tablet,  Take 1 tablet by mouth daily. .  mycophenolate (CELLCEPT) 500 MG tablet, Take 1,500 mg by mouth every 12 (twelve) hours. .  ondansetron (ZOFRAN) 4 MG tablet, Take 1 tablet (4 mg total) by mouth every 6 (six) hours as needed for nausea or vomiting. .  tacrolimus (PROGRAF) 1 MG capsule, Take 2 mg by mouth 2 (two) times daily.    Reviewed prior external information including notes and imaging from  primary care provider As well as notes that were available from care everywhere and other healthcare systems.  Past medical history, social, surgical and family history all  reviewed in electronic medical record.  No pertanent information unless stated regarding to the chief complaint.   Review of Systems:  No headache, visual changes, nausea, vomiting, diarrhea, constipation, dizziness, abdominal pain, skin rash, fevers, chills, night sweats, weight loss, swollen lymph nodes, body aches, joint swelling, chest pain, shortness of breath, mood changes. POSITIVE muscle aches  Objective  Blood pressure 130/90, pulse 80, height 5\' 10"  (1.778 m), weight 200 lb (90.7 kg), SpO2 97 %.   General: No apparent distress alert and oriented x3 mood and affect normal, dressed appropriately.  HEENT: Pupils equal, extraocular movements intact  Respiratory: Patient's speak in full sentences and does not appear short of breath  Cardiovascular: No lower extremity edema,, no erythema  Of the left lower extremity does have some fullness noted of the anterior lateral aspect of the leg.  Severely tender to even light sensation over the anterior tibialis muscle and the distal third.  Approximately 4 cm from the lateral malleolus approximately and minorly medially patient has severe tenderness in that area.  No masses appreciated but does have some mild fullness noted.  Foot exam shows very mild pes planus with mild overpronation of the hindfoot with standing.  Patient is severely tender over the calcaneal on the plantar aspect.   Limited musculoskeletal ultrasound was performed and interpreted by  Limited ultrasound of patient's heel does have a cortical irregularity noted.  Mild callus formation noted.  Patient does have overlying hypoechoic changes of the fat pad also noted.  No abnormal vascularity in this area. Patient aware patient is painful over the anterior lateral aspect of the leg does have some abnormal vascularity going through the anterior tibialis.  Questionable small tear of the musculature in this area.  With compression severe increase in discomfort and not  fully compressible blood vessel it appears.  No true masses appreciated. Impression: Stress fracture of the calcaneus with questionable abnormal vascularity of the anterior tibialis musculature possibly secondary to a muscle injury    Impression and Recommendations:     The above documentation has been reviewed and is accurate and complete Judi Saa, DO

## 2020-11-26 NOTE — Telephone Encounter (Signed)
Patient called stating that he was referred to [place] from [facility] for some calf pain. He said that he felt a pop and is having a hard time walking. He was concerned that he might have torn something.  I have him scheduled with Dr Denyse Amass next Tuesday (first available).   Is there anything we can recommend that he do in the meantime?

## 2020-11-27 ENCOUNTER — Encounter: Payer: Self-pay | Admitting: Family Medicine

## 2020-11-27 ENCOUNTER — Ambulatory Visit (INDEPENDENT_AMBULATORY_CARE_PROVIDER_SITE_OTHER): Payer: 59

## 2020-11-27 ENCOUNTER — Ambulatory Visit (INDEPENDENT_AMBULATORY_CARE_PROVIDER_SITE_OTHER): Payer: 59 | Admitting: Family Medicine

## 2020-11-27 ENCOUNTER — Other Ambulatory Visit: Payer: Self-pay

## 2020-11-27 ENCOUNTER — Ambulatory Visit: Payer: Self-pay

## 2020-11-27 VITALS — BP 130/90 | HR 80 | Ht 70.0 in | Wt 200.0 lb

## 2020-11-27 DIAGNOSIS — M2550 Pain in unspecified joint: Secondary | ICD-10-CM

## 2020-11-27 DIAGNOSIS — M79605 Pain in left leg: Secondary | ICD-10-CM

## 2020-11-27 DIAGNOSIS — M79672 Pain in left foot: Secondary | ICD-10-CM

## 2020-11-27 LAB — D-DIMER, QUANTITATIVE: D-Dimer, Quant: 0.23 mcg/mL FEU (ref <0.50)

## 2020-11-27 IMAGING — DX DG ANKLE COMPLETE 3+V*L*
3 series · 3 of 3 positions shown · non-contrast
Comparison: None.

CLINICAL DATA: Chronic pain

EXAM:
LEFT ANKLE COMPLETE - 3+ VIEW

[ankle ap]
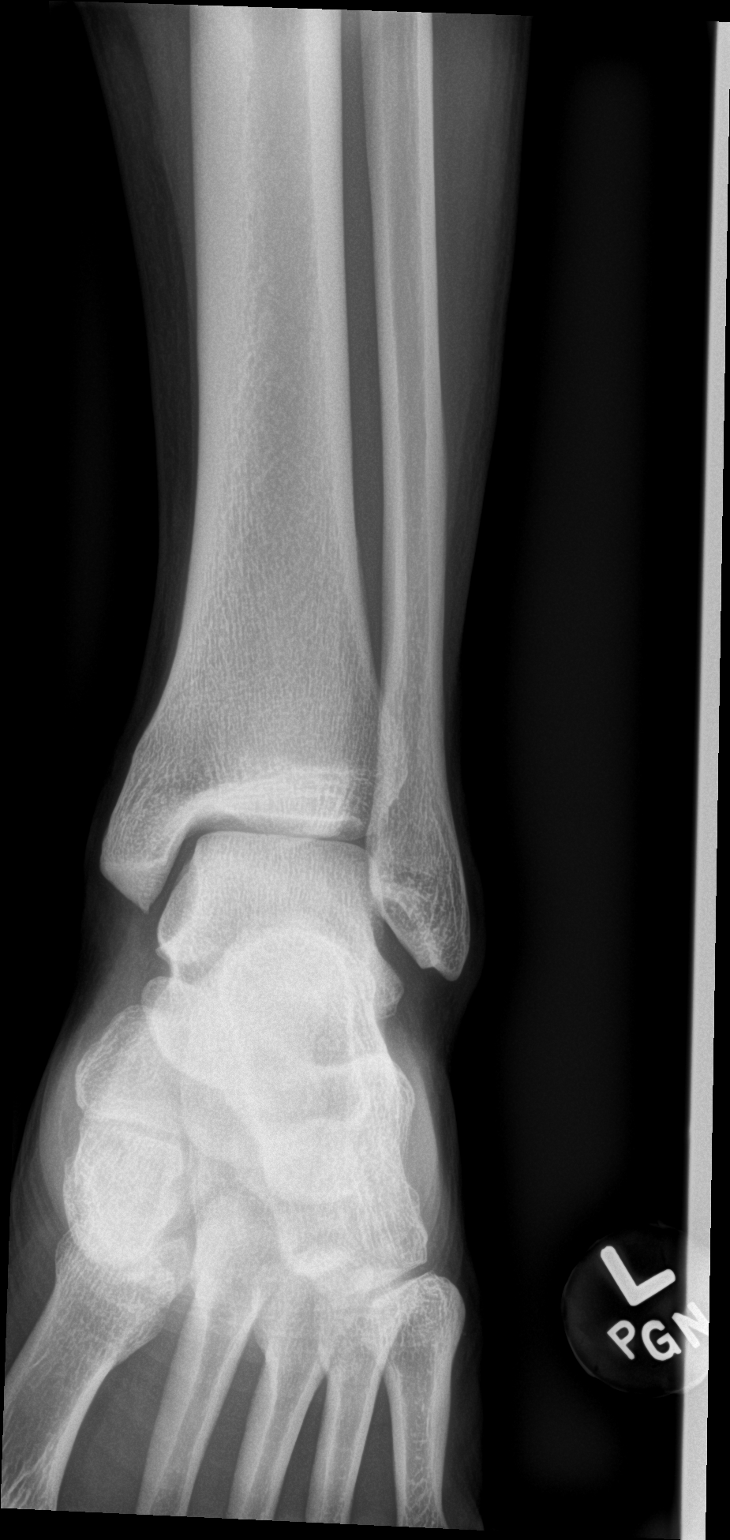

[ankle obl]
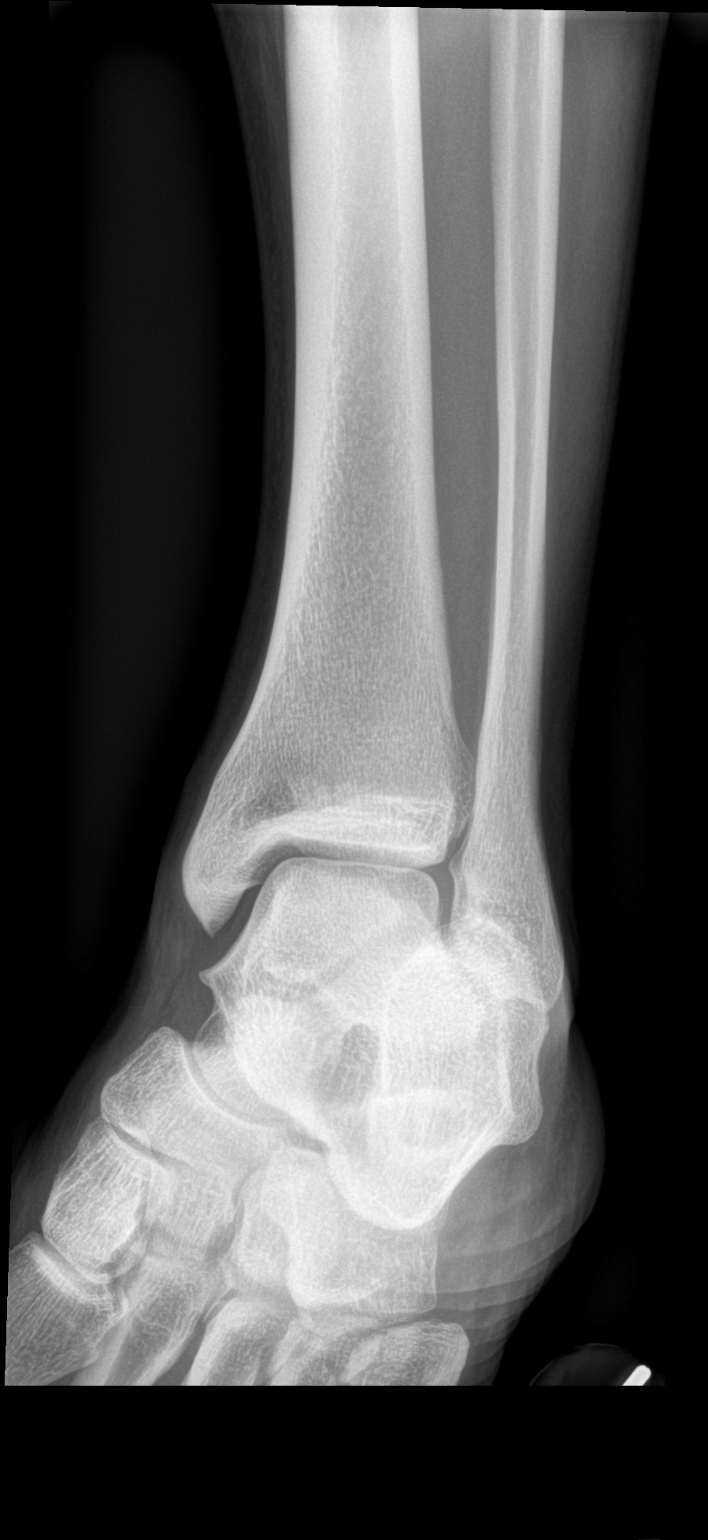

[ankle lat]
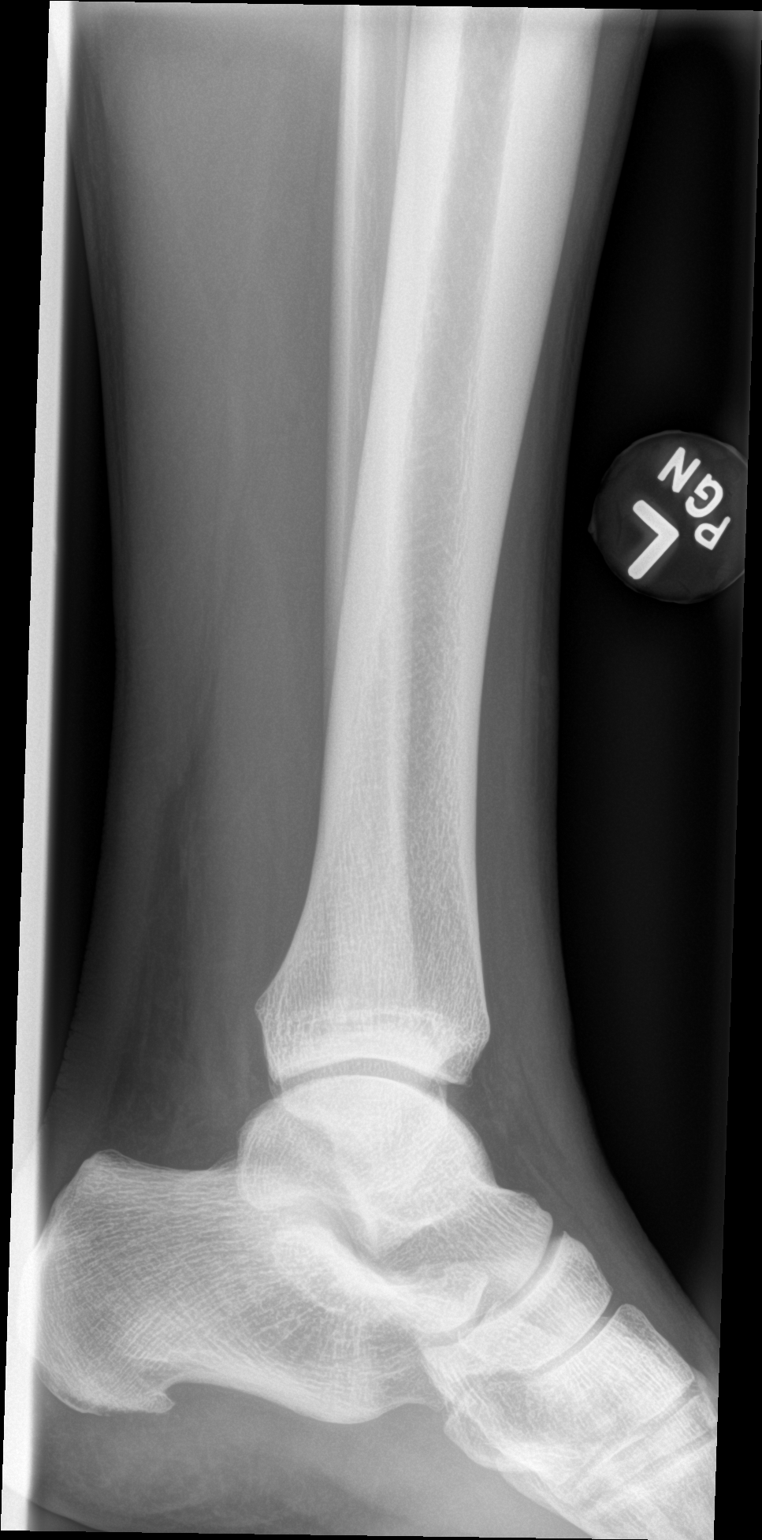

[3 of 3 positions shown; findings below may reference images not displayed]

FINDINGS: Frontal, oblique, and lateral views were obtained. No fracture or
joint effusion. No joint space narrowing or erosion. Small inferior
calcaneal spur. Ankle mortise appears intact.
IMPRESSION: Small inferior calcaneal spur. No fracture. No evident arthropathy.
Ankle mortise appears intact.

## 2020-11-27 IMAGING — DX DG TIBIA/FIBULA 2V*L*
4 series · 4 of 4 positions shown · non-contrast
Comparison: None.

CLINICAL DATA: Chronic pain

EXAM:
LEFT TIBIA AND FIBULA - 2 VIEW

[tibia ap (1 of 2)]
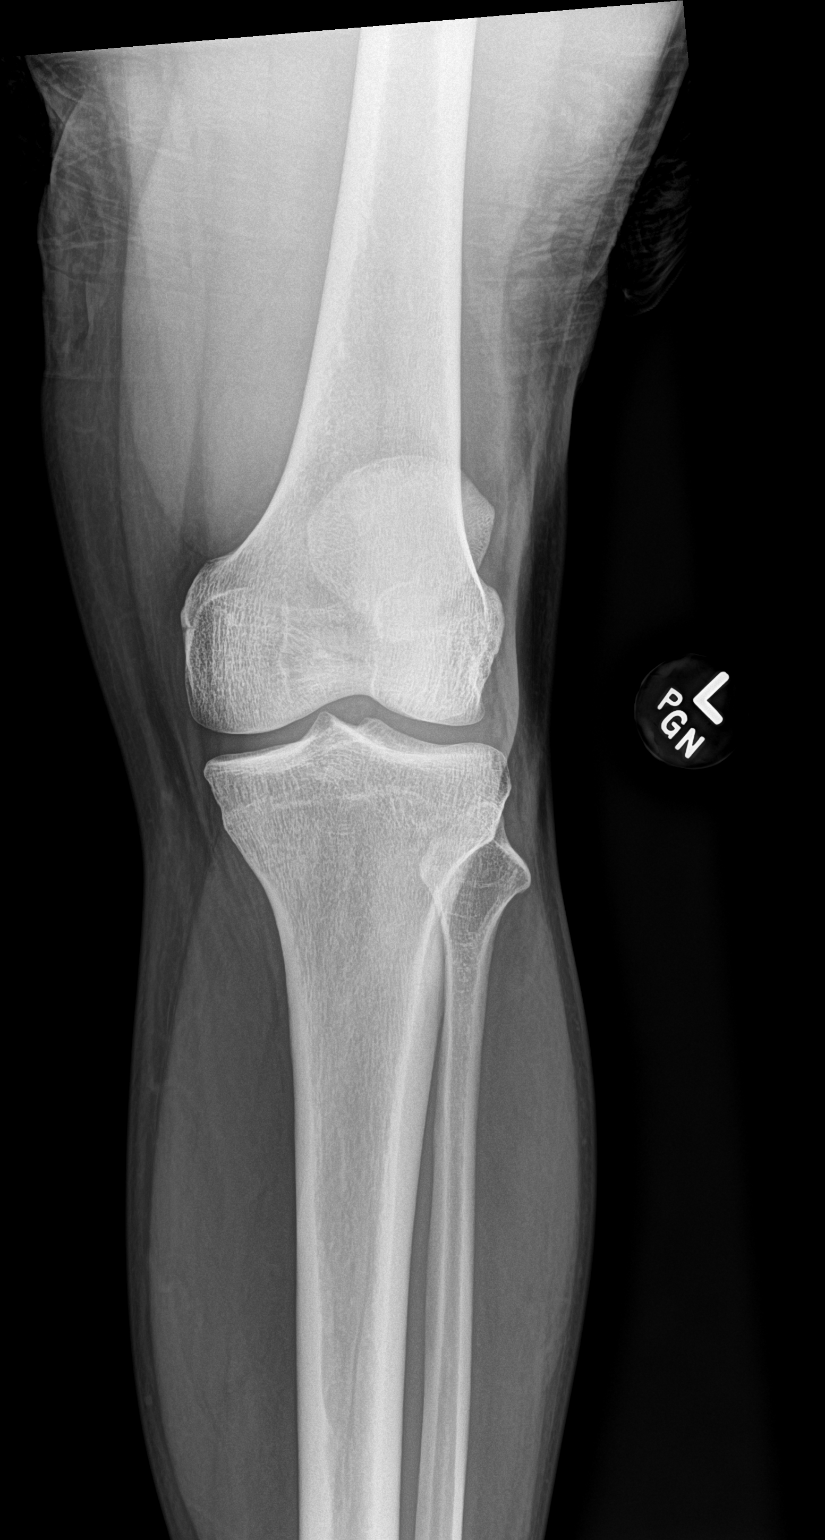

[tibia ap (2 of 2)]
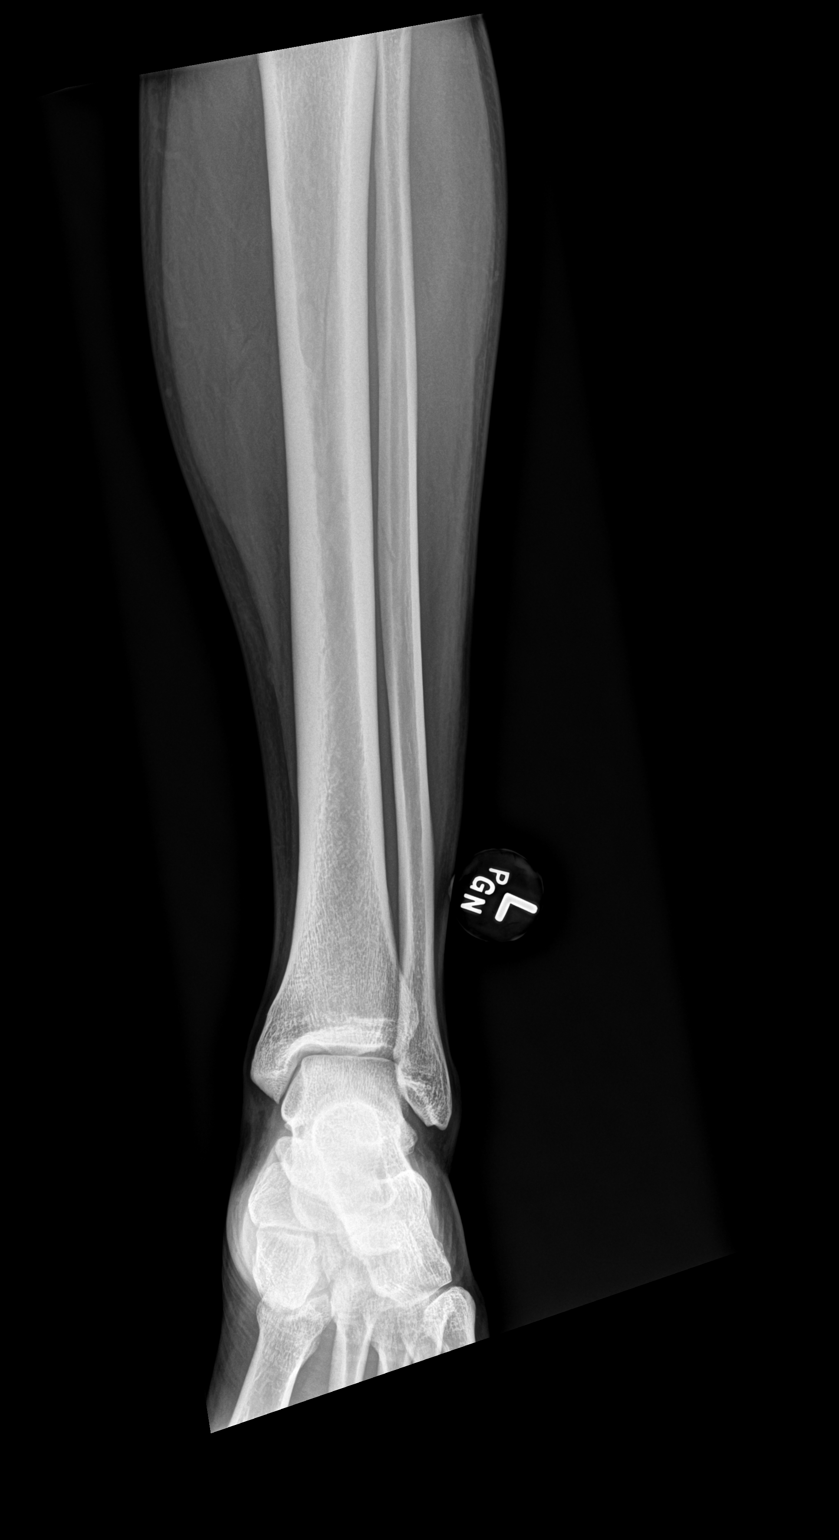

[tibia lat (1 of 2)]
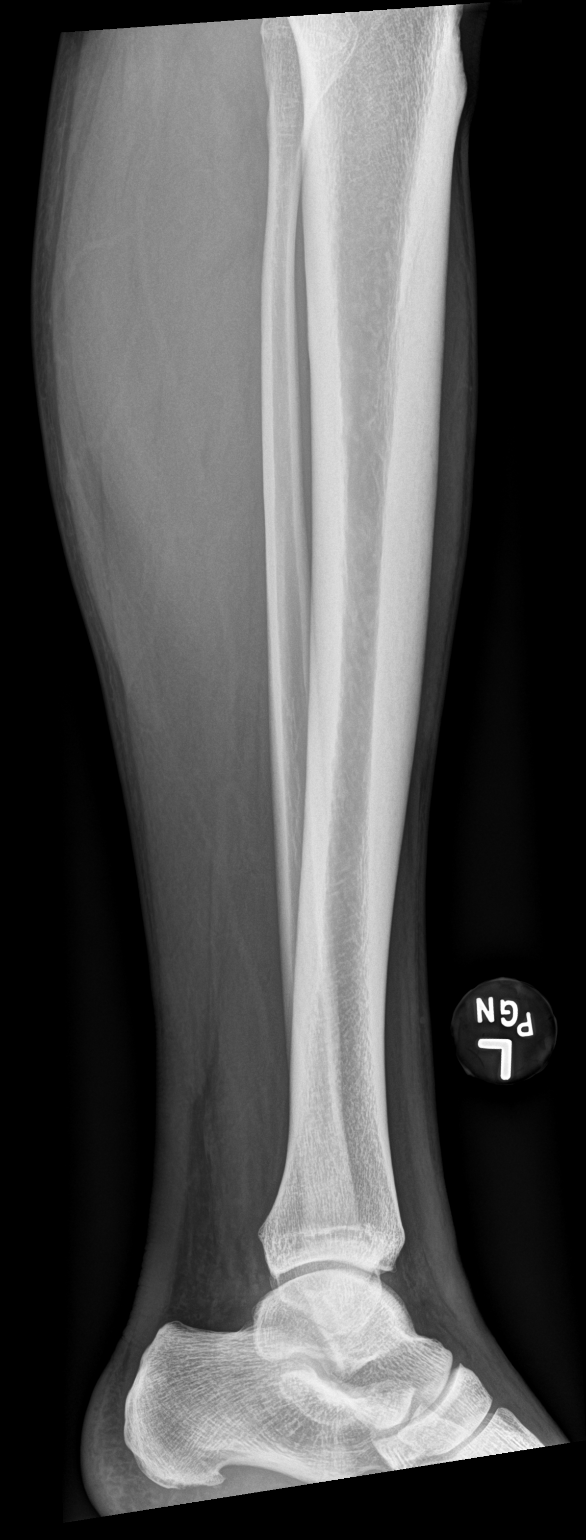

[tibia lat (2 of 2)]
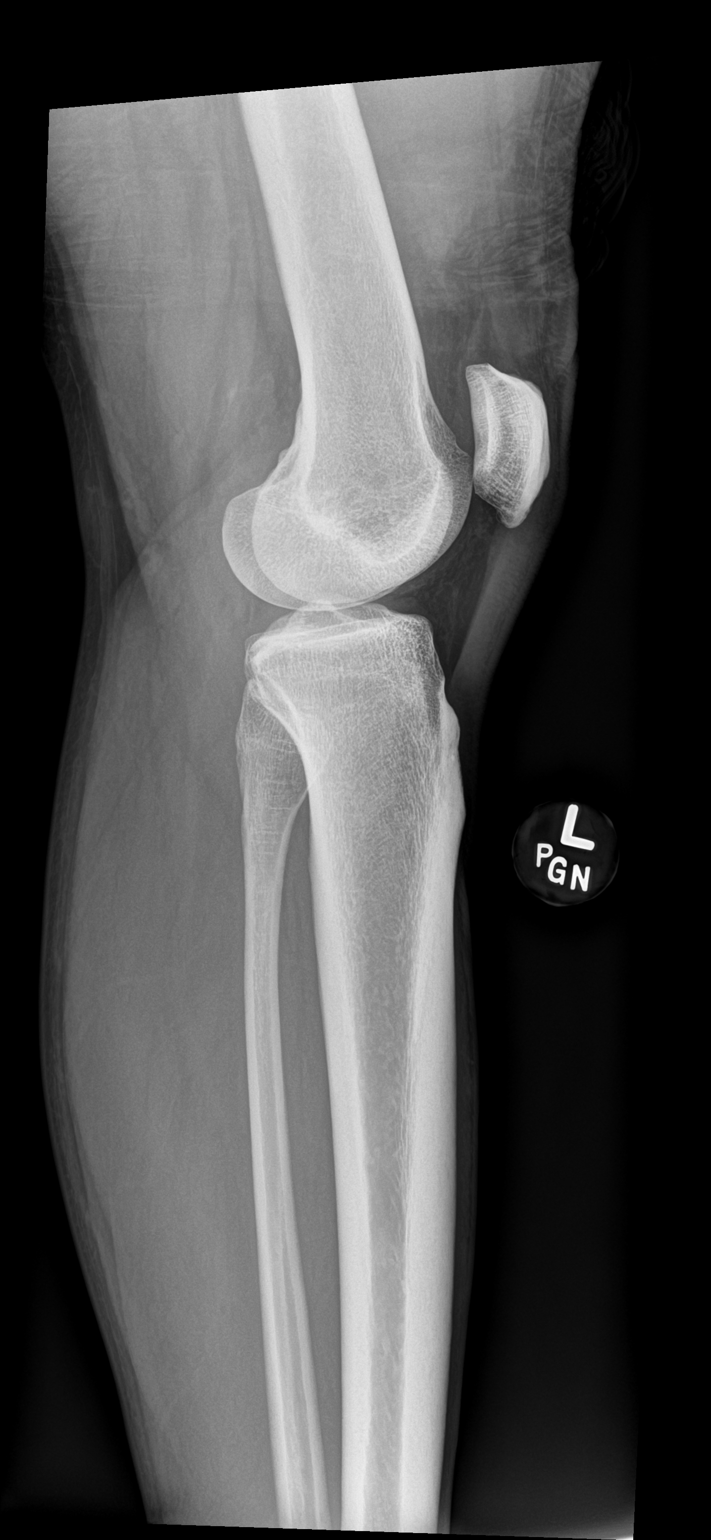

[4 of 4 positions shown; findings below may reference images not displayed]

FINDINGS: Frontal and lateral views were obtained. No fracture or dislocation.
No abnormal periosteal reaction. No joint space narrowing or
erosion. Small inferior calcaneal spur.
IMPRESSION: Small inferior calcaneal spur. No fracture or dislocation. No
evident arthropathy.

## 2020-11-27 NOTE — Patient Instructions (Addendum)
Good to see you  Xrays today and labs today Ice 20 minutes 2 times daily. Usually after activity and before bed. Wear the boot daily for next 3 weeks.  OK to try a shoe in the house but stay away from being barefoot OK to ride a bike When sitting come out of boot and move the ankle so it does not get stiff Will get d-dimer Need ultrasound todauy as well  See me again in 3-4 weeks  [facility]  [address] [place], [place] [address] [phone] 11/28/2020 3:00pm (2:45pm arrival for appt)

## 2020-11-27 NOTE — Assessment & Plan Note (Signed)
On ultrasound appears to have some cortical irregularity versus heel spur.  X-rays are pending.  Discussed icing regimen.  Discussed cam walker for now.  Likely the reason why patient is having pain more with activity than with rest which does seem different than plantar fasciitis.  Patient will do vitamin D supplementation and follow-up with me again in 3 to 4 weeks

## 2020-11-27 NOTE — Assessment & Plan Note (Addendum)
Patient is having left leg pain.  Seems to be more in the anterior tibialis.  Could be more secondary to the compensation.  Patient has had heel pain for quite some time and seems to be more of a stress reaction.  Patient though does have some increasing risk factors including Covid infection 6 months ago as well as patient is on immunomodulators for patient's heart transplant.  We will get a Doppler and a D-dimer to further evaluate.  Patient on ultrasound does have some very mild atypical vascularity noted in this area.  No true masses appreciated.  Patient now has significant pain with any type of compression in this area and did not have full compression of the vascularity in this area.  Patient is supposed to be traveling to [facility].  Encouraged him to have both of these test before he leaves.  Due to the severity of pain though patient will be put in a cam walker and if worsening pain would want MRI after follow-up in 3 weeks.  Patient states that this pain has been going on for months at this time.  Hopefully patient will make improvement and we can advance accordingly

## 2020-11-28 ENCOUNTER — Ambulatory Visit (HOSPITAL_COMMUNITY)
Admission: RE | Admit: 2020-11-28 | Discharge: 2020-11-28 | Disposition: A | Discharge status: Home / Self Care | Payer: 59 | Source: Ambulatory Visit | Attending: Cardiology | Admitting: Cardiology

## 2020-11-28 ENCOUNTER — Other Ambulatory Visit: Payer: Self-pay

## 2020-11-28 DIAGNOSIS — M79605 Pain in left leg: Secondary | ICD-10-CM | POA: Diagnosis present

## 2020-11-28 NOTE — Telephone Encounter (Signed)
Patient called back following up. ?

## 2020-12-03 ENCOUNTER — Other Ambulatory Visit: Payer: Self-pay

## 2020-12-03 ENCOUNTER — Ambulatory Visit: Payer: 59 | Admitting: Family Medicine

## 2020-12-03 DIAGNOSIS — M25572 Pain in left ankle and joints of left foot: Secondary | ICD-10-CM

## 2020-12-03 MED ORDER — GABAPENTIN 100 MG PO CAPS
100.0000 mg | ORAL_CAPSULE | Freq: Every day | ORAL | 0 refills | Status: DC
Start: 2020-12-03 — End: 2021-02-27

## 2020-12-20 ENCOUNTER — Ambulatory Visit: Payer: 59 | Admitting: Family Medicine

## 2020-12-24 ENCOUNTER — Ambulatory Visit
Admission: RE | Admit: 2020-12-24 | Discharge: 2020-12-24 | Disposition: A | Discharge status: Home / Self Care | Payer: 59 | Source: Ambulatory Visit | Attending: Family Medicine | Admitting: Family Medicine

## 2020-12-24 ENCOUNTER — Other Ambulatory Visit: Payer: Self-pay

## 2020-12-24 DIAGNOSIS — M25572 Pain in left ankle and joints of left foot: Secondary | ICD-10-CM

## 2020-12-24 IMAGING — MR MR ANKLE*L* W/O CM
5 series · 40 of 40 positions shown · non-contrast
Comparison: Radiographs [DATE].  Concurrent lower leg MRI.

CLINICAL DATA: Lateral ankle pain for 6 months. No specific injury
or prior relevant surgery. Tendon abnormality suspected.

EXAM:
MRI OF THE LEFT ANKLE WITHOUT CONTRAST
TECHNIQUE: Multiplanar, multisequence MR imaging of the ankle was performed. No
intravenous contrast was administered.

[Series 4: T2 fat-sat · axial · 3.0mm · 0.66mm/px · z∈[-105,+31]mm · 8 of 36 slices shown (1 of 2)]
[im 1/36]
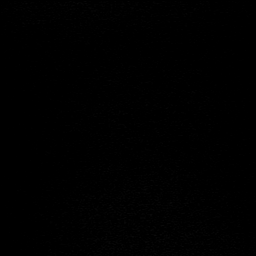
[im 6/36]
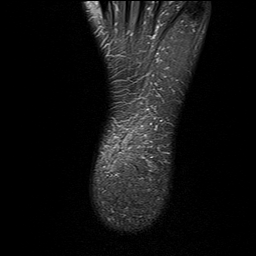
[im 11/36]
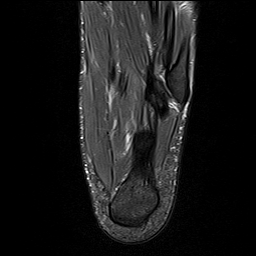
[im 16/36]
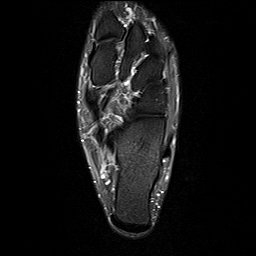
[im 21/36]
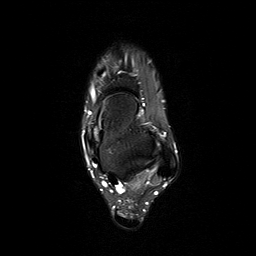
[im 26/36]
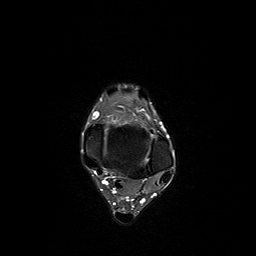
[im 31/36]
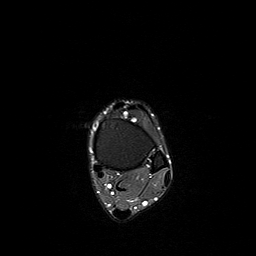
[im 36/36]
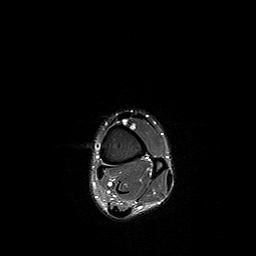

[Series 5: PD fat-sat · axial · 3.0mm · 0.66mm/px · z∈[-102,+34]mm · 9 of 36 slices shown]
[im 1/36]
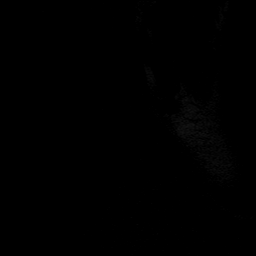
[im 5/36]
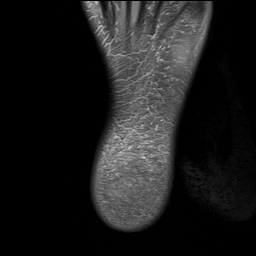
[im 9/36]
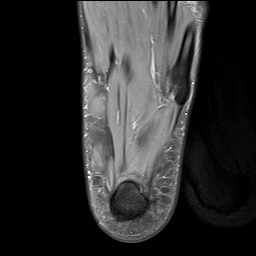
[im 14/36]
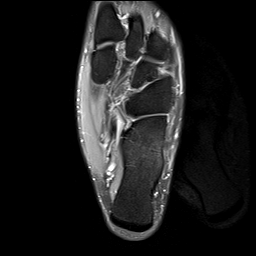
[im 18/36]
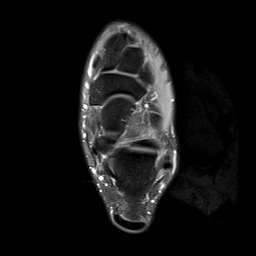
[im 22/36]
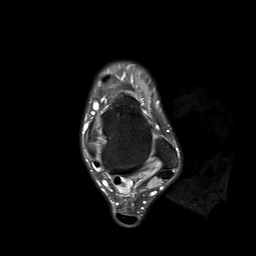
[im 27/36]
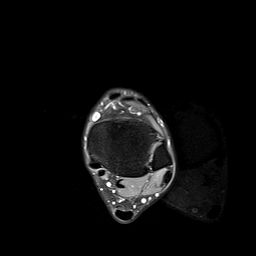
[im 31/36]
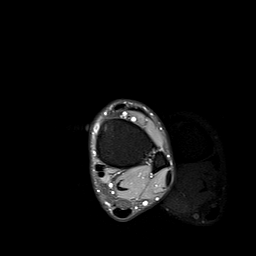
[im 36/36]
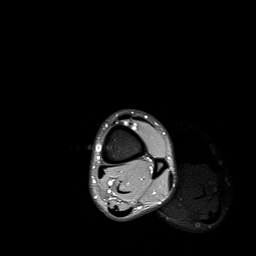

[Series 6: T1 · sagittal · 4.0mm · 0.56mm/px · 6 of 23 slices shown]
[im 1/23]
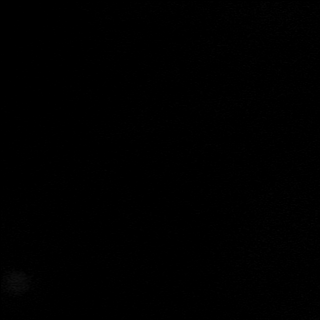
[im 5/23]
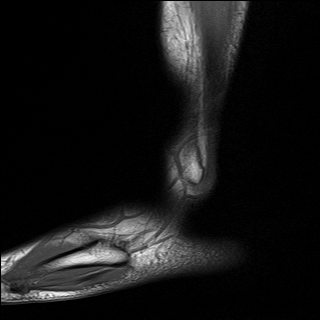
[im 9/23]
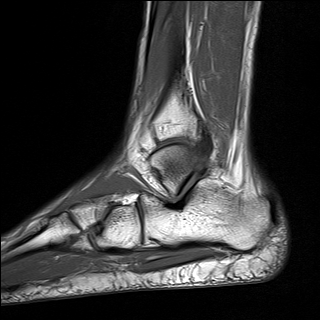
[im 14/23]
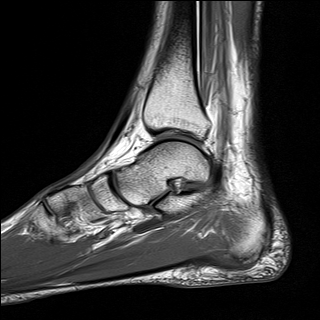
[im 18/23]
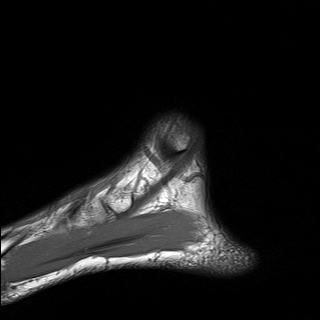
[im 23/23]
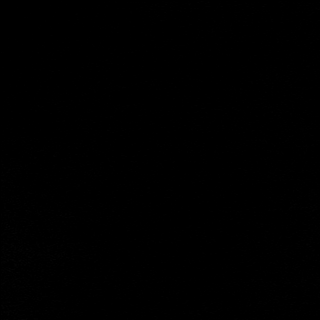

[Series 7: STIR · sagittal · 4.0mm · 0.35mm/px · 6 of 23 slices shown]
[im 1/23]
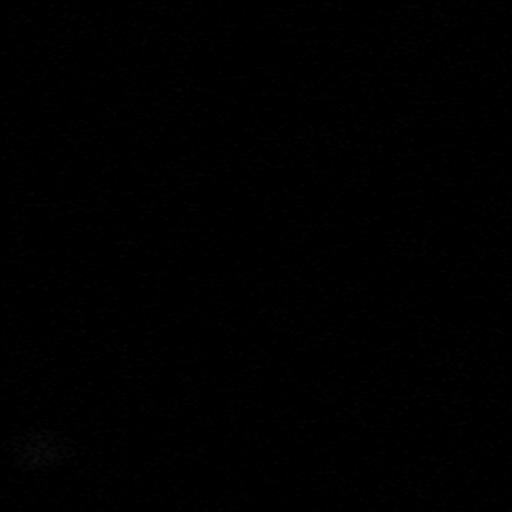
[im 5/23]
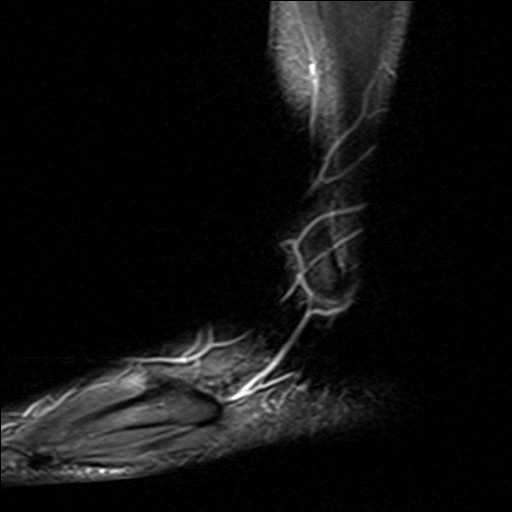
[im 9/23]
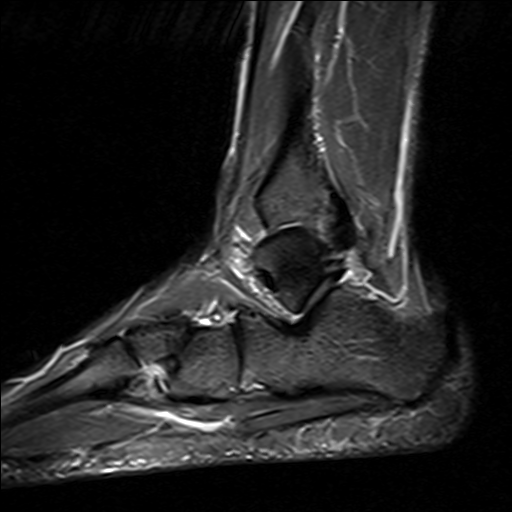
[im 14/23]
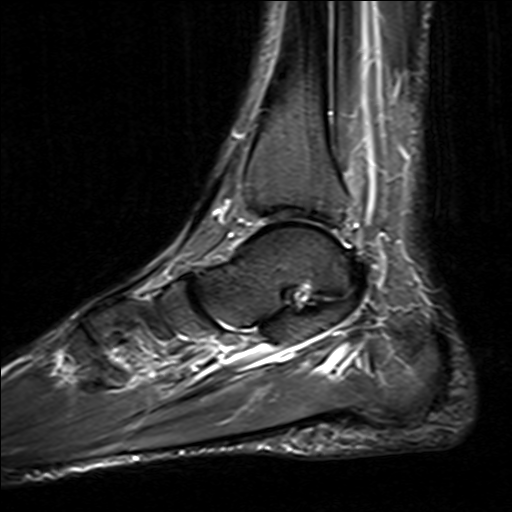
[im 18/23]
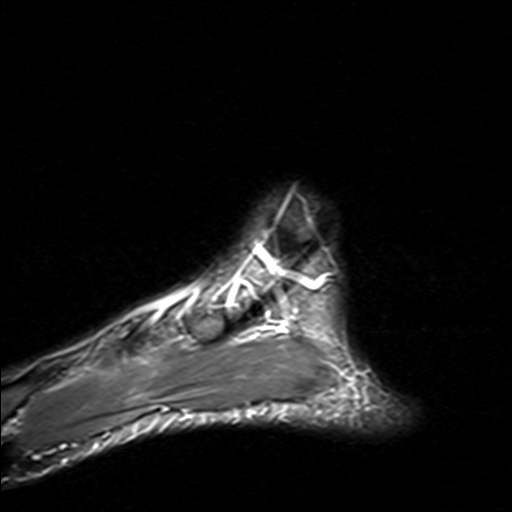
[im 23/23]
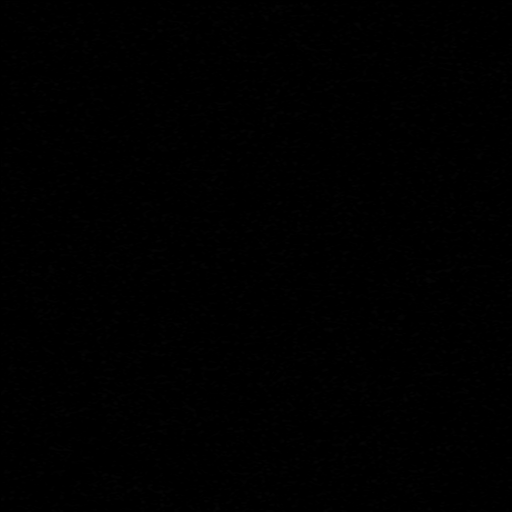

[Series 8: T2 fat-sat · coronal · 3.0mm · 0.66mm/px · 11 of 43 slices shown (2 of 2)]
[im 1/43]
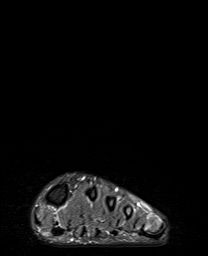
[im 5/43]
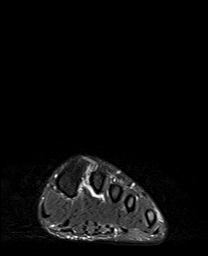
[im 9/43]
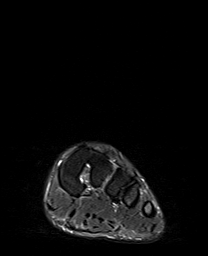
[im 13/43]
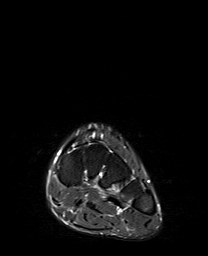
[im 17/43]
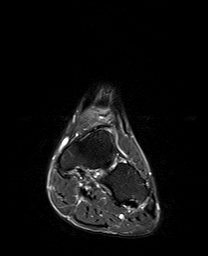
[im 22/43]
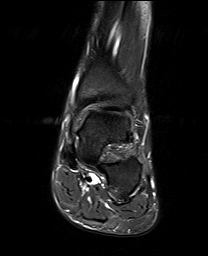
[im 26/43]
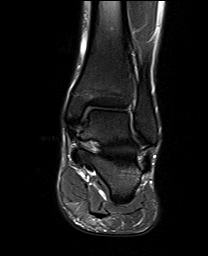
[im 30/43]
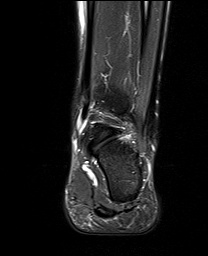
[im 34/43]
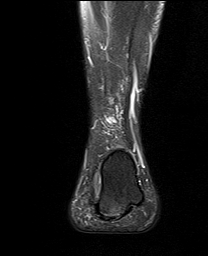
[im 38/43]
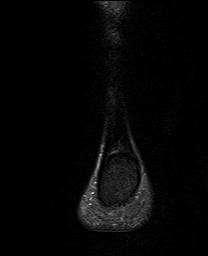
[im 43/43]
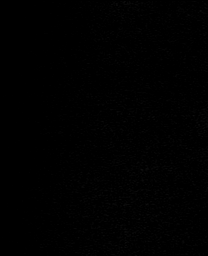

[40 of 40 positions shown; findings below may reference images not displayed]

FINDINGS: TENDONS

Peroneal: Intact and normally positioned.

Posteromedial: Intact and normally positioned. There is a small
amount of fluid within the flexor digitorum and flexor hallucis
longus tendon sheaths.

Anterior: Intact and normally positioned.

Achilles: Intact.

Plantar Fascia: There is moderate thickening and T2 hyperintensity
within the central cord of the plantar fascia. There is associated
plantar calcaneal spur formation with associated mild marrow and
surrounding soft tissue edema. No fascial rupture.

LIGAMENTS

Lateral: The anterior and posterior talofibular and calcaneofibular
ligaments are intact.

Medial: The deltoid and visualized portions of the spring ligament
appear intact.

CARTILAGE AND BONES

Ankle Joint: No significant ankle joint effusion. The talar dome and
tibial plafond are intact.

Subtalar Joints/Sinus Tarsi: Unremarkable.

Bones: No acute osseous findings or significant arthropathic
changes.

Other: No significant soft tissue findings.
IMPRESSION: 1. Moderate plantar fasciitis. No fascial rupture.
2. Small amount of fluid within the flexor digitorum and flexor
hallucis longus tendon sheaths, likely physiologic. No significant
lateral tendon abnormalities identified.
3. No acute osseous findings or significant arthropathic changes.

## 2020-12-24 IMAGING — MR MR [PERSON_NAME] LOW W/O CM*L*
4 of 5 series · 22 of 40 positions shown · non-contrast
Comparison: Radiographs [DATE]

CLINICAL DATA: Lower leg and lateral ankle pain for 6 months.
Stress fracture suspected.

EXAM:
MRI OF LOWER LEFT EXTREMITY WITHOUT CONTRAST
TECHNIQUE: Multiplanar, multisequence MR imaging of the left lower leg was
performed. No intravenous contrast was administered.

[Series 5: T1 · coronal · 4.0mm · 0.92mm/px · 5 of 41 slices shown (1 of 2)]
[im 1/41]
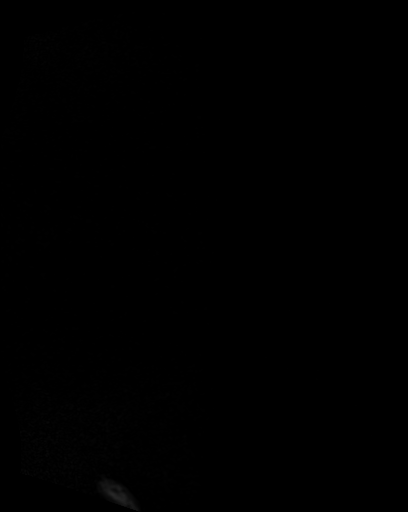
[im 9/41]
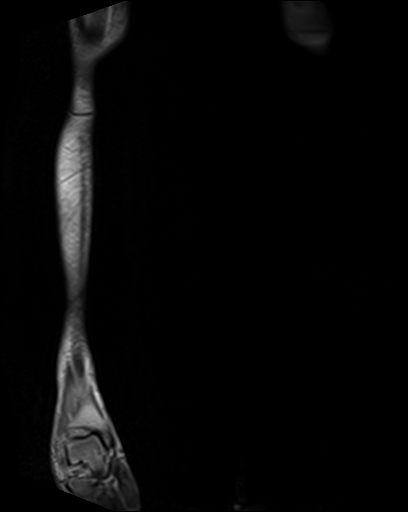
[im 17/41]
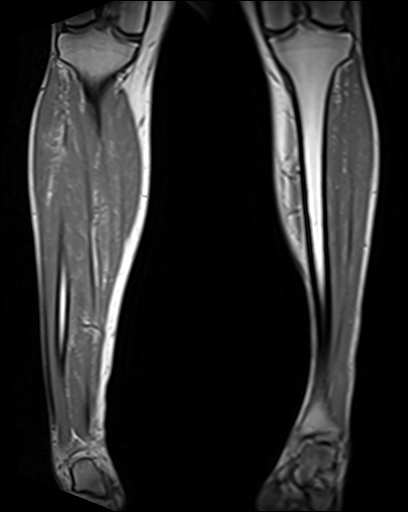
[im 25/41]
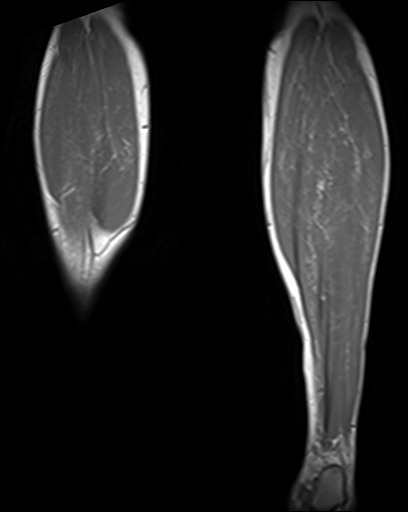
[im 41/41]
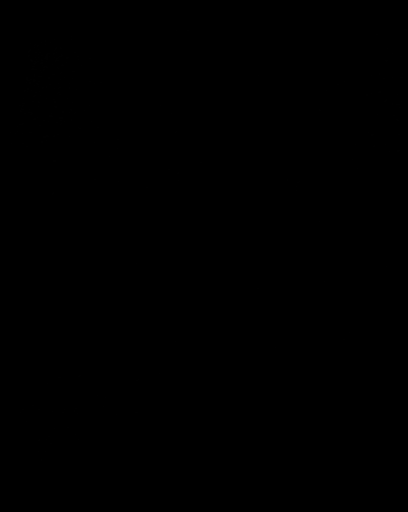

[Series 6: STIR · coronal · 4.0mm · 1.84mm/px · 3 of 41 slices shown]
[im 7/41]
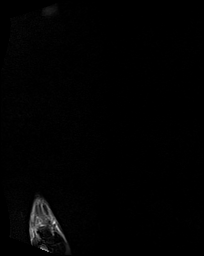
[im 21/41]
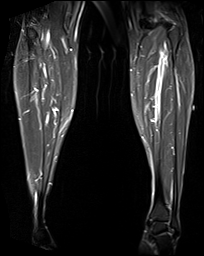
[im 34/41]
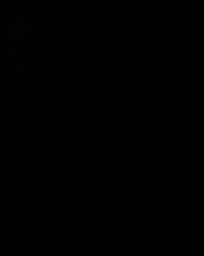

[Series 7: T2 fat-sat · axial · 5.0mm · 0.35mm/px · z∈[-338,+71]mm · 11 of 67 slices shown]
[im 1/67]
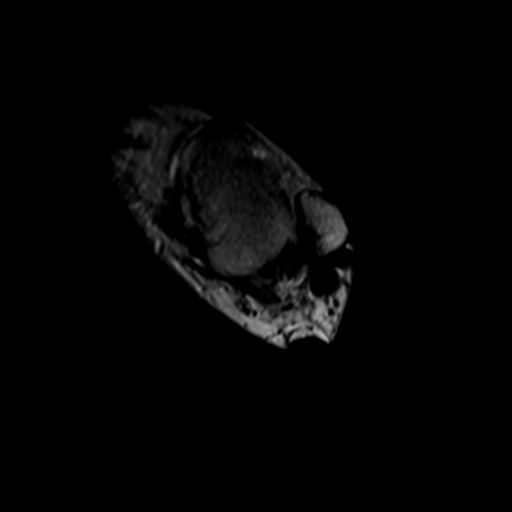
[im 7/67]
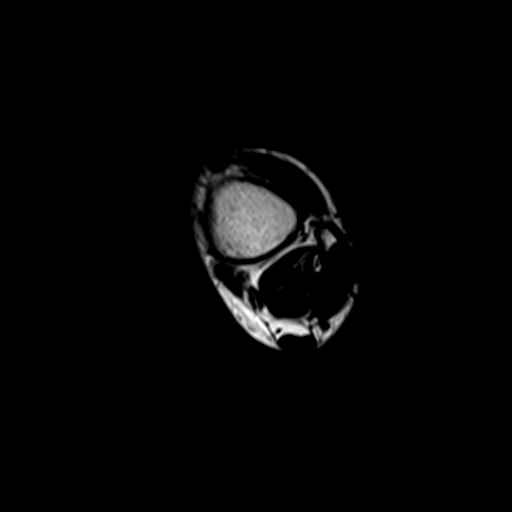
[im 14/67]
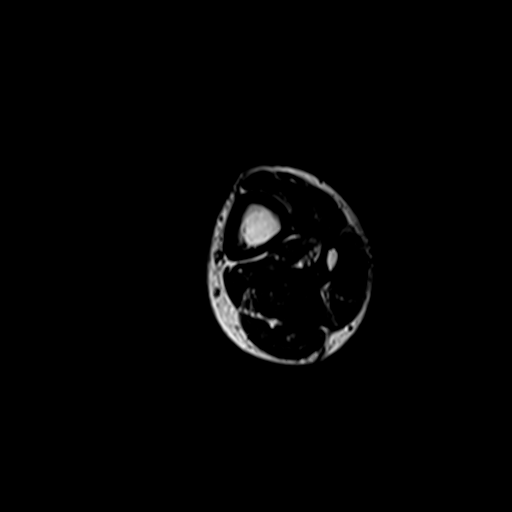
[im 20/67]
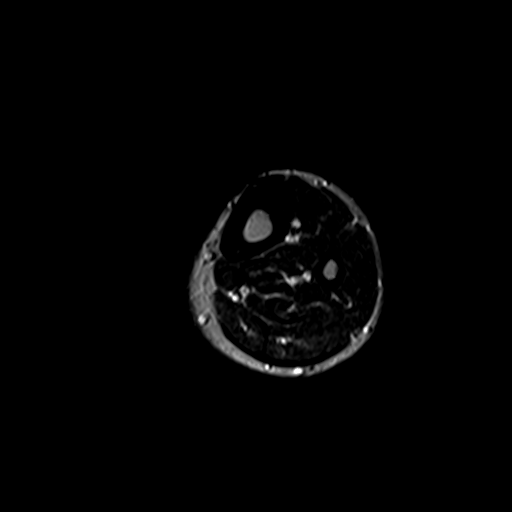
[im 27/67]
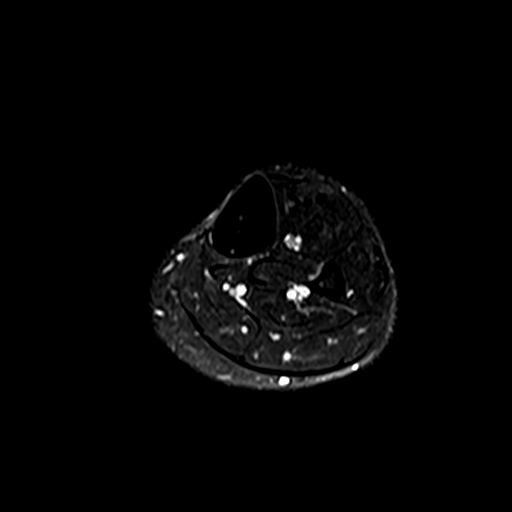
[im 34/67]
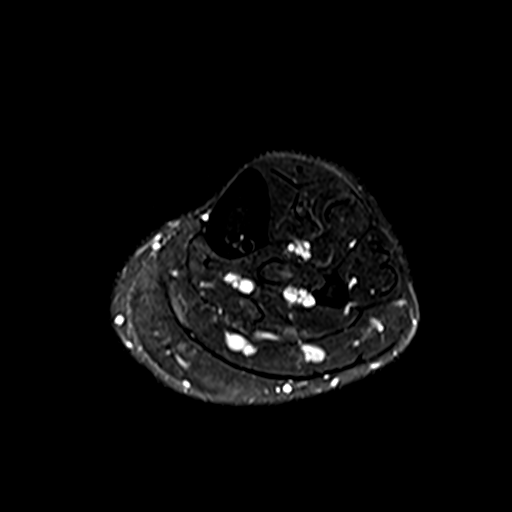
[im 40/67]
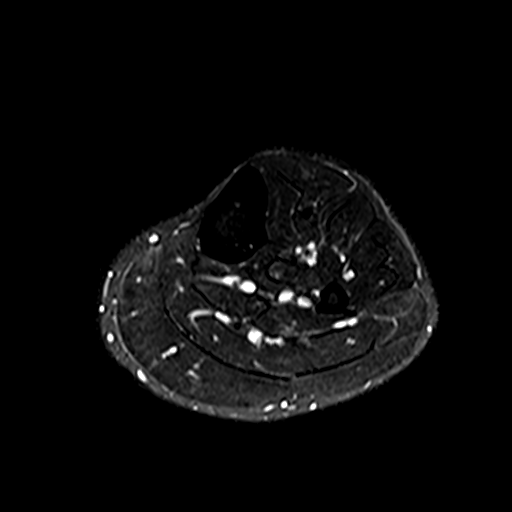
[im 47/67]
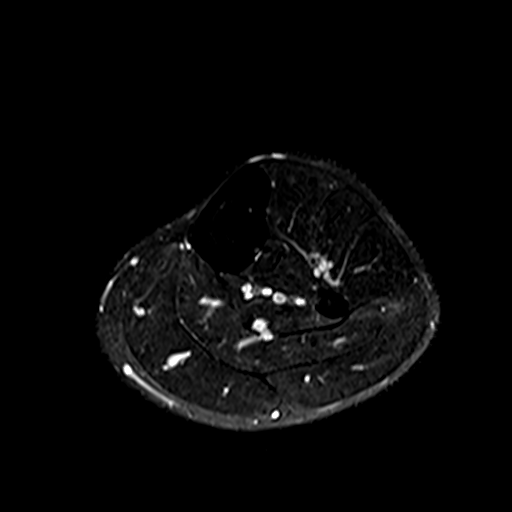
[im 53/67]
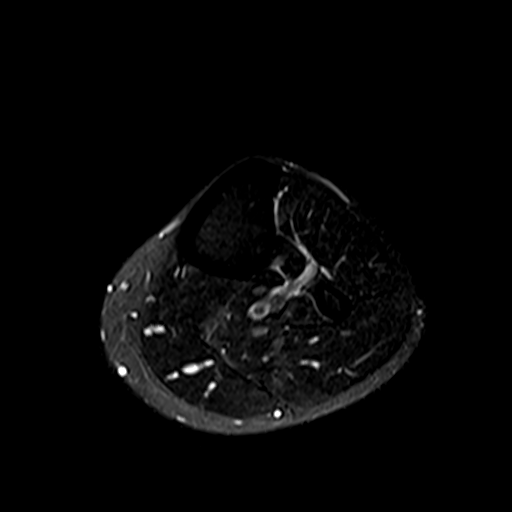
[im 60/67]
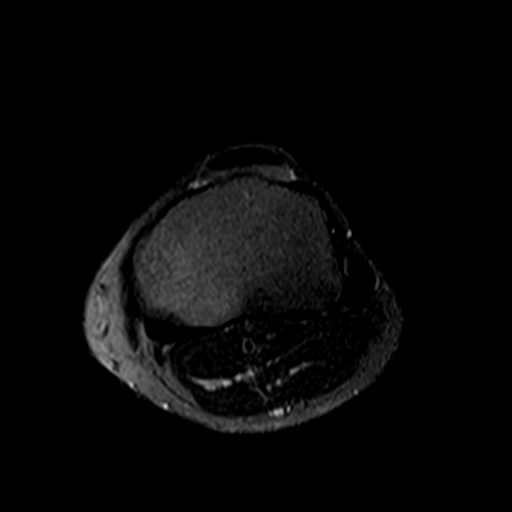
[im 67/67]
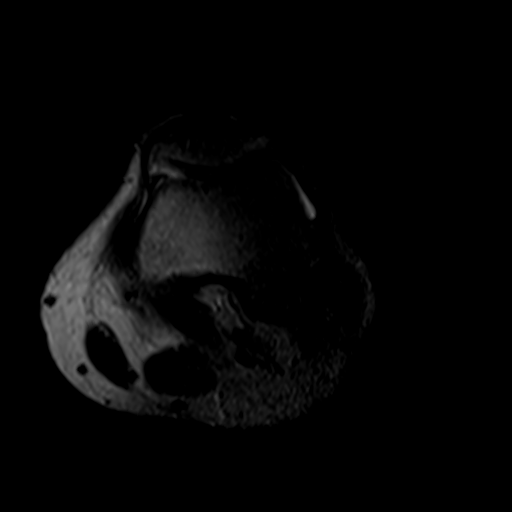

[Series 8: T1 · axial · 5.0mm · 0.35mm/px · z∈[-301,+28]mm · 3 of 67 slices shown (2 of 2)]
[im 7/67]
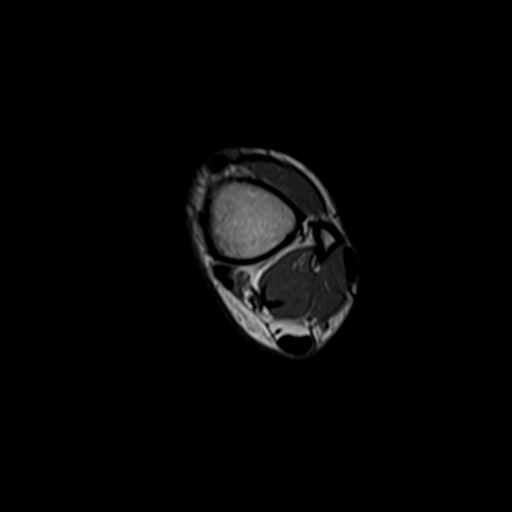
[im 34/67]
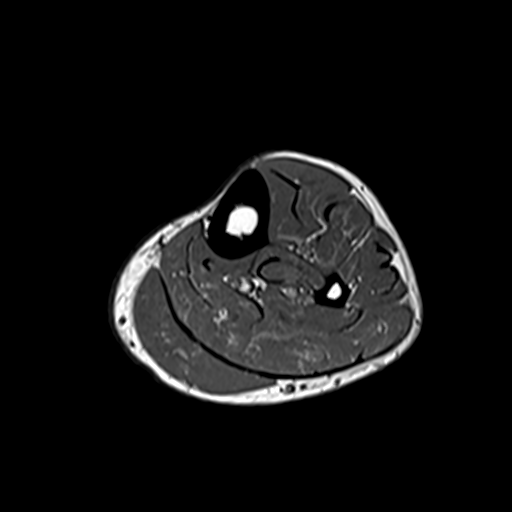
[im 60/67]
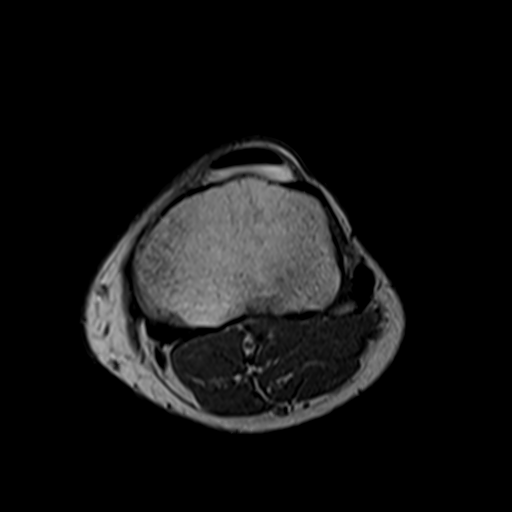

[22 of 40 positions shown; findings below may reference images not displayed]

FINDINGS: Bones/Joint/Cartilage

Both lower legs are included on the coronal images. There is no bone
marrow edema, cortical thickening or periosteal edema. Specifically,
no evidence of stress fracture. No evidence of left ankle or knee
joint effusion.

Ligaments

Not relevant for exam/indication.

Muscles and Tendons

There is a small ill-defined focus of increased T1 and T2 signal
proximally in the lateral head of the gastrocnemius muscle (best
seen on axial images 20 through 23 of series 7 and 8) which may
relate to remote muscular injury. Overlying capsules are in this
general area. There is no focal fluid collection. The lower leg
muscles and tendons otherwise appear normal.

Soft tissues

No other significant soft tissue findings are identified. There is
no evidence of mass or fluid collection in the area of the patient's
concern.
IMPRESSION: 1. No acute findings or explanation for the patient's concern. No
evidence of stress fracture.
2. Small ill-defined focus of increased T1 and T2 signal proximally
in the lateral head of the gastrocnemius muscle may relate to remote
muscular injury.

## 2020-12-25 ENCOUNTER — Encounter: Payer: Self-pay | Admitting: Family Medicine

## 2020-12-25 ENCOUNTER — Telehealth: Payer: Self-pay | Admitting: Family Medicine

## 2020-12-25 ENCOUNTER — Other Ambulatory Visit: Payer: Self-pay

## 2020-12-25 DIAGNOSIS — M79672 Pain in left foot: Secondary | ICD-10-CM

## 2020-12-25 NOTE — Telephone Encounter (Signed)
Awaiting Dr. Michaelle Copas reply to patient's questions in MyChart. Called patient to discuss his questions. Dr. Katrinka Blazing reply to MRI states that patient should obtain orthotics and do PT. Patient is fine with this plan. Referral placed to [facility] for both. Told patient that he likely did not need to wear the boot at this time as he is going to be fitted for orthotics but that Dr. Katrinka Blazing will get back to him through MyChart. Patient voices understanding.

## 2020-12-25 NOTE — Telephone Encounter (Signed)
Pt awaiting MRI results, he saw them in MyChart but I advised that Katrinka Blazing has not reviewed yet.

## 2020-12-25 NOTE — Telephone Encounter (Signed)
Pt called back. He would like to talk to someone that can give him a little more detail on Dr. Michaelle Copas mychart msg.

## 2020-12-26 ENCOUNTER — Other Ambulatory Visit: Payer: Self-pay

## 2020-12-26 ENCOUNTER — Ambulatory Visit (INDEPENDENT_AMBULATORY_CARE_PROVIDER_SITE_OTHER): Payer: 59

## 2020-12-26 ENCOUNTER — Ambulatory Visit (INDEPENDENT_AMBULATORY_CARE_PROVIDER_SITE_OTHER): Payer: 59 | Admitting: Podiatry

## 2020-12-26 DIAGNOSIS — M79672 Pain in left foot: Secondary | ICD-10-CM

## 2020-12-26 DIAGNOSIS — M79671 Pain in right foot: Secondary | ICD-10-CM

## 2020-12-26 DIAGNOSIS — M722 Plantar fascial fibromatosis: Secondary | ICD-10-CM

## 2020-12-26 MED ORDER — TRIAMCINOLONE ACETONIDE 10 MG/ML IJ SUSP
10.0000 mg | Freq: Once | INTRAMUSCULAR | Status: AC
  Administered 2020-12-26: 10 mg

## 2020-12-26 NOTE — Patient Instructions (Signed)

## 2020-12-29 ENCOUNTER — Other Ambulatory Visit: Payer: Self-pay | Admitting: Orthopaedic Surgery

## 2020-12-29 MED ORDER — NITROGLYCERIN 0.2 MG/HR TD PT24: MEDICATED_PATCH | TRANSDERMAL | 3 refills | Status: DC

## 2020-12-30 NOTE — Progress Notes (Signed)
Subjective:   Patient ID: Shawn Calderon, male   DOB: 45 y.o.   MRN: [id number]   HPI Patient presents stating he has had a lot of heel pain over the last 6 months and would like some form of orthotic therapy if needed.  Patient does not smoke likes to be active   Review of Systems  All other systems reviewed and are negative.       Objective:  Physical Exam Vitals and nursing note reviewed.  Constitutional:      Appearance: He is well-developed and well-nourished.  Cardiovascular:     Pulses: Intact distal pulses.  Pulmonary:     Effort: Pulmonary effort is normal.  Musculoskeletal:        General: Normal range of motion.  Skin:    General: Skin is warm.  Neurological:     Mental Status: He is alert.     Neurovascular status was found to be intact muscle strength was found to be adequate range of motion adequate.  Patient is found to have exquisite discomfort in the plantar aspect of the heel region bilateral with inflammation fluid around the medial band at the insertion of the tendon into the calcaneus.  Patient is found to have good digital perfusion well oriented x3     Assessment:  Acute plantar fasciitis bilateral with inflammation fluid of the medial band     Plan:  HP reviewed condition and went ahead today and educated him on acute [place] fasciitis.  Discussed possible long-term orthotics but I want to focus first on getting better and I did sterile prep and injected each plantar fascia 3 mg Kenalog 5 mg Xylocaine and applied fascial brace bilateral.  Placed on diclofenac 75 mg twice daily reappoint to recheck  X-rays indicate that there is spur no indications of stress fracture with moderate pression of the arch bilateral

## 2021-01-09 ENCOUNTER — Ambulatory Visit: Payer: 59 | Admitting: Podiatry

## 2021-02-27 ENCOUNTER — Other Ambulatory Visit: Payer: Self-pay | Admitting: Family Medicine

## 2021-06-17 ENCOUNTER — Encounter: Payer: Self-pay | Admitting: *Deleted

## 2022-02-24 ENCOUNTER — Ambulatory Visit (INDEPENDENT_AMBULATORY_CARE_PROVIDER_SITE_OTHER): Payer: PRIVATE HEALTH INSURANCE | Admitting: Internal Medicine

## 2022-02-24 ENCOUNTER — Encounter: Payer: Self-pay | Admitting: Internal Medicine

## 2022-02-24 ENCOUNTER — Other Ambulatory Visit (INDEPENDENT_AMBULATORY_CARE_PROVIDER_SITE_OTHER): Payer: PRIVATE HEALTH INSURANCE

## 2022-02-24 VITALS — BP 120/78 | HR 81 | Ht 70.0 in | Wt 213.0 lb

## 2022-02-24 DIAGNOSIS — R197 Diarrhea, unspecified: Secondary | ICD-10-CM

## 2022-02-24 DIAGNOSIS — R140 Abdominal distension (gaseous): Secondary | ICD-10-CM | POA: Diagnosis not present

## 2022-02-24 DIAGNOSIS — Z1211 Encounter for screening for malignant neoplasm of colon: Secondary | ICD-10-CM

## 2022-02-24 DIAGNOSIS — K625 Hemorrhage of anus and rectum: Secondary | ICD-10-CM

## 2022-02-24 LAB — COMPREHENSIVE METABOLIC PANEL
ALT: 20 U/L (ref 0–53)
AST: 23 U/L (ref 0–37)
Albumin: 4.6 g/dL (ref 3.5–5.2)
Alkaline Phosphatase: 72 U/L (ref 39–117)
BUN: 27 mg/dL — ABNORMAL HIGH (ref 6–23)
CO2: 25 mEq/L (ref 19–32)
Calcium: 10.3 mg/dL (ref 8.4–10.5)
Chloride: 106 mEq/L (ref 96–112)
Creatinine, Ser: 1.3 mg/dL (ref 0.40–1.50)
GFR: 65.46 mL/min (ref >60.00)
Glucose, Bld: 97 mg/dL (ref 70–99)
Potassium: 4.1 mEq/L (ref 3.5–5.1)
Sodium: 139 mEq/L (ref 135–145)
Total Bilirubin: 0.7 mg/dL (ref 0.2–1.2)
Total Protein: 7.5 g/dL (ref 6.0–8.3)

## 2022-02-24 LAB — CBC WITH DIFFERENTIAL/PLATELET
Basophils Absolute: 0 10*3/uL (ref 0.0–0.1)
Basophils Relative: 0.3 % (ref 0.0–3.0)
Eosinophils Absolute: 0.1 10*3/uL (ref 0.0–0.7)
Eosinophils Relative: 0.9 % (ref 0.0–5.0)
HCT: 43.1 % (ref 39.0–52.0)
Hemoglobin: 14.5 g/dL (ref 13.0–17.0)
Lymphocytes Relative: 34.2 % (ref 12.0–46.0)
Lymphs Abs: 2.1 10*3/uL (ref 0.7–4.0)
MCHC: 33.7 g/dL (ref 30.0–36.0)
MCV: 87 fl (ref 78.0–100.0)
Monocytes Absolute: 0.5 10*3/uL (ref 0.1–1.0)
Monocytes Relative: 7.9 % (ref 3.0–12.0)
Neutro Abs: 3.5 10*3/uL (ref 1.4–7.7)
Neutrophils Relative %: 56.7 % (ref 43.0–77.0)
Platelets: 233 10*3/uL (ref 150.0–400.0)
RBC: 4.95 Mil/uL (ref 4.22–5.81)
RDW: 13.6 % (ref 11.5–15.5)
WBC: 6.2 10*3/uL (ref 4.0–10.5)

## 2022-02-24 MED ORDER — SUTAB 1479-225-188 MG PO TABS: 1.0000 | ORAL_TABLET | Freq: Once | ORAL | 0 refills | Status: AC

## 2022-02-24 NOTE — Progress Notes (Addendum)
HISTORY OF PRESENT ILLNESS: ? ?Shawn Calderon is a 46 y.o. 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  The patient tells me that his chronic problems began a few years ago.  He describes approximately 7 bowel movements per day without form.  3 times per week this will wake him from sleep.  Symptoms are exacerbated by meals.  There has been some minor rectal bleeding and dark stools.  He states that he lives on Imodium and Pepto-Bismol.  He has not had any evaluation for this issue.  It was recommended by his [facility] transplant team that he seek GI assessment.  He denies any new or changing medications around the time of symptom development.  He does drink mostly bottled water but does have well water at his residence.  He tells me that a young friend of his was diagnosed with metastatic colon cancer.  He is concerned.  Review of x-ray file shows CT scan from April 01, 2020 to evaluate nausea vomiting and abdominal pain.  The examination revealed enteritis with a diarrheal state as well as diverticulosis.  Review of blood work from [facility] December 10, 2021 shows normal PSA, normal CBC with differential, normal tacrolimus level, normal magnesium, normal hemoglobin A1c, mildly elevated CK, creatinine 1.4, normal liver tests, albumin, and proteins.  Normal potassium. ? ?REVIEW OF SYSTEMS: ? ?All non-GI ROS negative unless otherwise stated in the HPI except for sinus and allergy trouble ? ?Past Medical History:  ?Diagnosis Date  ? Heart transplant recipient [facility]) 2008  ? ? ?History reviewed. No pertinent surgical history. ? ?Social History ?Shawn Calderon  reports that he has never smoked. He has never used smokeless tobacco. He reports that he does not currently use alcohol. He reports that he does not currently use drugs. ? ?family history includes  Diverticulosis in his father and mother. ? ?Allergies  ?Allergen Reactions  ? Sulfamethoxazole Hives and Rash  ? Amoxicillin-Pot Clavulanate Diarrhea  ? Clindamycin Other (See Comments)  ?  Heartburn  ? ? ?  ? ?PHYSICAL EXAMINATION: ?Vital signs: BP 120/78   Pulse 81   Ht 5\' 10"  (1.778 m)   Wt 213 lb (96.6 kg)   SpO2 96%   BMI 30.56 kg/m?   ?Constitutional: generally well-appearing, no acute distress ?Psychiatric: alert and oriented x3, cooperative ?Eyes: extraocular movements intact, anicteric, conjunctiva pink ?Mouth: oral pharynx moist, no lesions ?Neck: supple no lymphadenopathy ?Cardiovascular: heart regular rate and rhythm, no murmur ?Lungs: clear to auscultation bilaterally ?Abdomen: soft, nontender, nondistended, no obvious ascites, no peritoneal signs, normal bowel sounds, no organomegaly ?Rectal: Deferred until colonoscopy ?Extremities: no clubbing, cyanosis, or lower extremity edema bilaterally ?Skin: no lesions on visible extremities ?Neuro: No focal deficits.  Cranial nerves intact ? ?ASSESSMENT: ? ?1.  Chronic problems with diarrhea and abdominal bloating as described above.  Etiology unclear.  Broad differential, which we discussed. ?2.  Dark stools likely related to Pepto-Bismol ?3.  Monitor rectal bleed ?4.  Colon cancer screening ?5.  Status post heart transplant on immunosuppressive therapy.  Doing well ? ? ?PLAN: ? ?1.  CBC, comprehensive metabolic panel, ?2.  Celiac testing ?3.  Upper endoscopy to evaluate postprandial abdominal bloating discomfort and diarrhea.The nature of the procedure, as well as the risks, benefits, and alternatives were carefully and thoroughly reviewed with the patient. Ample time for discussion and questions allowed.  The patient understood, was satisfied, and agreed to proceed.  ?4.  Colonoscopy for colon cancer screening and to evaluate abdominal complaints.The nature of the procedure, as well as the risks, benefits, and alternatives were carefully and thoroughly  reviewed with the patient. Ample time for discussion and questions allowed. The patient understood, was satisfied, and agreed to proceed.  ?5.  Consider advanced imaging ?6.  Consider course of therapy for bacterial overgrowth such as Xifaxan ?7.  Follow-up after the above completed ?Total time of 60 minutes was spent preparing to see the patient, obtaining comprehensive history, performing comprehensive physical examination, counseling and educating the patient regarding the above listed issues, ordering laboratories, serology tensing, and endoscopic procedures.  Finally, documenting clinical information in the health record ? ? ? ?  ?

## 2022-02-24 NOTE — Patient Instructions (Signed)
If you are age 48 or older, your body mass index should be between 23-30. Your Body mass index is 30.56 kg/m?Marland Kitchen If this is out of the aforementioned range listed, please consider follow up with your Primary Care Provider. ? ?If you are age 89 or younger, your body mass index should be between 19-25. Your Body mass index is 30.56 kg/m?Marland Kitchen If this is out of the aformentioned range listed, please consider follow up with your Primary Care Provider.  ? ?________________________________________________________ ? ?The [facility] providers would like to encourage you to use [facility] to communicate with providers for non-urgent requests or questions.  Due to long hold times on the telephone, sending your provider a message by [facility] may be a faster and more efficient way to get a response.  Please allow 48 business hours for a response.  Please remember that this is for non-urgent requests.  ?_______________________________________________________ ? ?You have been scheduled for an endoscopy and colonoscopy. Please follow the written instructions given to you at your visit today. ?Please pick up your prep supplies at the pharmacy within the next 1-3 days. ?If you use inhalers (even only as needed), please bring them with you on the day of your procedure. ? ?

## 2022-02-25 ENCOUNTER — Encounter: Payer: Self-pay | Admitting: Internal Medicine

## 2022-02-25 ENCOUNTER — Telehealth: Payer: Self-pay | Admitting: Internal Medicine

## 2022-02-25 LAB — IGA: Immunoglobulin A: 469 mg/dL — ABNORMAL HIGH (ref 47–310)

## 2022-02-25 LAB — TISSUE TRANSGLUTAMINASE, IGA: (tTG) Ab, IgA: <1 U/mL

## 2022-02-25 NOTE — Telephone Encounter (Signed)
See my chart message

## 2022-02-25 NOTE — Telephone Encounter (Signed)
Patient called and stated that he would like to discuss his lab results from yesterday. Please advise.  ?

## 2022-03-01 ENCOUNTER — Telehealth: Payer: PRIVATE HEALTH INSURANCE | Admitting: Family

## 2022-03-01 DIAGNOSIS — J019 Acute sinusitis, unspecified: Secondary | ICD-10-CM

## 2022-03-01 MED ORDER — CETIRIZINE HCL 10 MG PO TABS: 10.0000 mg | ORAL_TABLET | Freq: Every day | ORAL | 1 refills | Status: DC

## 2022-03-01 MED ORDER — DOXYCYCLINE MONOHYDRATE 100 MG PO CAPS: 100.0000 mg | ORAL_CAPSULE | Freq: Two times a day (BID) | ORAL | 0 refills | Status: DC

## 2022-03-01 NOTE — Progress Notes (Signed)
?Virtual Visit Consent  ? ?Shawn Calderon, you are scheduled for a virtual visit with a [facility] provider today.   ?  ?Just as with appointments in the office, your consent must be obtained to participate.  Your consent will be active for this visit and any virtual visit you may have with one of our providers in the next 365 days.   ?  ?If you have a MyChart account, a copy of this consent can be sent to you electronically.  All virtual visits are billed to your insurance company just like a traditional visit in the office.   ? ?As this is a virtual visit, video technology does not allow for your provider to perform a traditional examination.  This may limit your provider's ability to fully assess your condition.  If your provider identifies any concerns that need to be evaluated in person or the need to arrange testing (such as labs, EKG, etc.), we will make arrangements to do so.   ?  ?Although advances in technology are sophisticated, we cannot ensure that it will always work on either your end or our end.  If the connection with a video visit is poor, the visit may have to be switched to a telephone visit.  With either a video or telephone visit, we are not always able to ensure that we have a secure connection.    ? ?I need to obtain your verbal consent now.   Are you willing to proceed with your visit today?  ?  ?Shawn Calderon has provided verbal consent on 03/01/2022 for a virtual visit (video or telephone). ?  ?Jannifer Rodney, FNP  ? ?Date: 03/01/2022 6:39 PM ? ? ?Virtual Visit via Video Note  ? ?Shawn Calderon, connected with  Shawn Calderon  ([id number], Oct 18, 1976) on 03/01/22 at  6:45 PM EDT by a video-enabled telemedicine application and verified that I am speaking with the correct person using two identifiers. ? ?Location: ?Patient: Virtual Visit Location Patient: Home ?Provider: Virtual Visit Location Provider: Home Office ?  ?I discussed the limitations of evaluation and management by  telemedicine and the availability of in person appointments. The patient expressed understanding and agreed to proceed.   ? ?History of Present Illness: ?Shawn Calderon is a 46 y.o. who identifies as a male who was assigned male at birth, and is being seen today for sinus problems that started a few weeks ago that has worsen. ? ?HPI: Sinusitis ?This is a new problem. The current episode started 1 to 4 weeks ago. The problem has been gradually worsening since onset. There has been no fever. His pain is at a severity of 4/10. The pain is mild. Associated symptoms include chills, congestion, coughing, headaches, a hoarse voice, sinus pressure, sneezing and a sore throat. Pertinent negatives include no ear pain or swollen glands. Past treatments include acetaminophen and oral decongestants. The treatment provided mild relief.   ?Problems:  ?Patient Active Problem List  ? Diagnosis Date Noted  ? Left leg pain 11/27/2020  ? Intractable left heel pain 11/27/2020  ? Upper respiratory infection 09/26/2020  ? Chronic diarrhea 09/05/2020  ? Ear fullness, left 06/04/2020  ? COVID-19 virus infection 04/23/2020  ? Intractable vomiting 04/02/2020  ? Bilateral impacted cerumen 09/28/2019  ? 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 Hyperlipidemia 12/27/2012  ? Heart transplant status (HCC) 11/24/2011  ? Hypertension 11/24/2011  ? OCD (obsessive compulsive disorder) 11/24/2011  ?  ?Allergies:  ?  Allergies  ?Allergen Reactions  ? Sulfamethoxazole Hives and Rash  ? Amoxicillin-Pot Clavulanate Diarrhea  ? Clindamycin Other (See Comments)  ?  Heartburn  ? ?Medications:  ?Current Outpatient Medications:  ?  cetirizine (ZYRTEC ALLERGY) 10 MG tablet, Take 1 tablet (10 mg total) by mouth daily., Disp: 90 tablet, Rfl: 1 ?  doxycycline (MONODOX) 100 MG capsule, Take 1 capsule (100 mg total) by mouth 2 (two) times daily., Disp: 20 capsule, Rfl: 0 ?  acetaminophen (TYLENOL) 500 MG tablet, Take 1,000 mg by mouth  2 (two) times daily as needed for mild pain, fever or headache., Disp: , Rfl:  ?  citalopram (CELEXA) 20 MG tablet, Take 30 mg by mouth at bedtime. , Disp: , Rfl:  ?  clonazePAM (KLONOPIN) 0.5 MG tablet, Take 1 mg by mouth 2 (two) times daily as needed for anxiety., Disp: , Rfl:  ?  loperamide (IMODIUM A-D) 2 MG tablet, Take 1 tablet (2 mg total) by mouth 4 (four) times daily as needed for diarrhea or loose stools., Disp: 60 tablet, Rfl: 0 ?  Multiple Vitamin (MULTIVITAMIN WITH MINERALS) TABS tablet, Take 1 tablet by mouth daily., Disp: , Rfl:  ?  mycophenolate (CELLCEPT) 500 MG tablet, Take 500 mg by mouth. 3 tablets twice a day, Disp: , Rfl:  ?  rosuvastatin (CRESTOR) 20 MG tablet, Take 20 mg by mouth at bedtime., Disp: , Rfl:  ?  tacrolimus (PROGRAF) 1 MG capsule, Take 2 mg by mouth 2 (two) times daily. , Disp: , Rfl:  ? ?Observations/Objective: ?Patient is well-developed, well-nourished in no acute distress.  ?Resting comfortably  at home.  ?Head is normocephalic, atraumatic.  ?No labored breathing.  ?Speech is clear and coherent with logical content.  ?Patient is alert and oriented at baseline.  ?Nasal congestion ? ?Assessment and Plan: ?1. Acute sinusitis, recurrence not specified, unspecified location ?- doxycycline (MONODOX) 100 MG capsule; Take 1 capsule (100 mg total) by mouth 2 (two) times daily.  Dispense: 20 capsule; Refill: 0 ?- cetirizine (ZYRTEC ALLERGY) 10 MG tablet; Take 1 tablet (10 mg total) by mouth daily.  Dispense: 90 tablet; Refill: 1 ? ?- Take meds as prescribed ?- Use a cool mist humidifier  ?-Use saline nose sprays frequently ?-Force fluids ?-For any cough or congestion ? Use plain Mucinex- regular strength or max strength is fine ?-For fever or aces or pains- take tylenol or ibuprofen. ?-Throat lozenges if help ?-New toothbrush in 3 days ? ? ?Follow Up Instructions: ?I discussed the assessment and treatment plan with the patient. The patient was provided an opportunity to ask questions  and all were answered. The patient agreed with the plan and demonstrated an understanding of the instructions.  A copy of instructions were sent to the patient via MyChart unless otherwise noted below.  ? ? ? ?The patient was advised to call back or seek an in-person evaluation if the symptoms worsen or if the condition fails to improve as anticipated. ? ?Time:  ?I spent 7 minutes with the patient via telehealth technology discussing the above problems/concerns.   ? ?Jannifer Rodney, FNP ? ?

## 2022-03-21 ENCOUNTER — Other Ambulatory Visit: Payer: Self-pay

## 2022-03-21 ENCOUNTER — Emergency Department (HOSPITAL_COMMUNITY): Payer: No Typology Code available for payment source

## 2022-03-21 ENCOUNTER — Encounter (HOSPITAL_COMMUNITY): Payer: Self-pay | Admitting: Emergency Medicine

## 2022-03-21 ENCOUNTER — Emergency Department (HOSPITAL_COMMUNITY)
Admission: EM | Admit: 2022-03-21 | Discharge: 2022-03-22 | Disposition: A | Discharge status: Home / Self Care | Payer: No Typology Code available for payment source | Attending: Emergency Medicine | Admitting: Emergency Medicine

## 2022-03-21 DIAGNOSIS — R0602 Shortness of breath: Secondary | ICD-10-CM | POA: Diagnosis present

## 2022-03-21 DIAGNOSIS — Z20822 Contact with and (suspected) exposure to covid-19: Secondary | ICD-10-CM | POA: Diagnosis not present

## 2022-03-21 DIAGNOSIS — J210 Acute bronchiolitis due to respiratory syncytial virus: Secondary | ICD-10-CM | POA: Insufficient documentation

## 2022-03-21 DIAGNOSIS — M549 Dorsalgia, unspecified: Secondary | ICD-10-CM | POA: Diagnosis not present

## 2022-03-21 LAB — CBC
HCT: 44.1 % (ref 39.0–52.0)
Hemoglobin: 14.5 g/dL (ref 13.0–17.0)
MCH: 28.9 pg (ref 26.0–34.0)
MCHC: 32.9 g/dL (ref 30.0–36.0)
MCV: 88 fL (ref 80.0–100.0)
Platelets: 258 10*3/uL (ref 150–400)
RBC: 5.01 MIL/uL (ref 4.22–5.81)
RDW: 12.3 % (ref 11.5–15.5)
WBC: 6.5 10*3/uL (ref 4.0–10.5)
nRBC: 0 % (ref 0.0–0.2)

## 2022-03-21 LAB — RESPIRATORY PANEL BY PCR

## 2022-03-21 LAB — BASIC METABOLIC PANEL
Anion gap: 10 (ref 5–15)
BUN: 18 mg/dL (ref 6–20)
CO2: 23 mmol/L (ref 22–32)
Calcium: 10.1 mg/dL (ref 8.9–10.3)
Chloride: 104 mmol/L (ref 98–111)
Creatinine, Ser: 1.43 mg/dL — ABNORMAL HIGH (ref 0.61–1.24)
GFR, Estimated: >60 mL/min (ref >60)
Glucose, Bld: 126 mg/dL — ABNORMAL HIGH (ref 70–99)
Potassium: 4 mmol/L (ref 3.5–5.1)
Sodium: 137 mmol/L (ref 135–145)

## 2022-03-21 LAB — RESP PANEL BY RT-PCR (FLU A&B, COVID) ARPGX2
Influenza A by PCR: NEGATIVE
Influenza B by PCR: NEGATIVE
SARS Coronavirus 2 by RT PCR: NEGATIVE

## 2022-03-21 LAB — TROPONIN I (HIGH SENSITIVITY): Troponin I (High Sensitivity): 12 ng/L (ref <18)

## 2022-03-21 LAB — BRAIN NATRIURETIC PEPTIDE: B Natriuretic Peptide: 99.8 pg/mL (ref 0.0–100.0)

## 2022-03-21 IMAGING — CR DG CHEST 2V
2 series · 2 of 2 positions shown · non-contrast
Comparison: [DATE]

CLINICAL DATA: Chest pain and shortness of breath

EXAM:
CHEST - 2 VIEW

[chest pa]
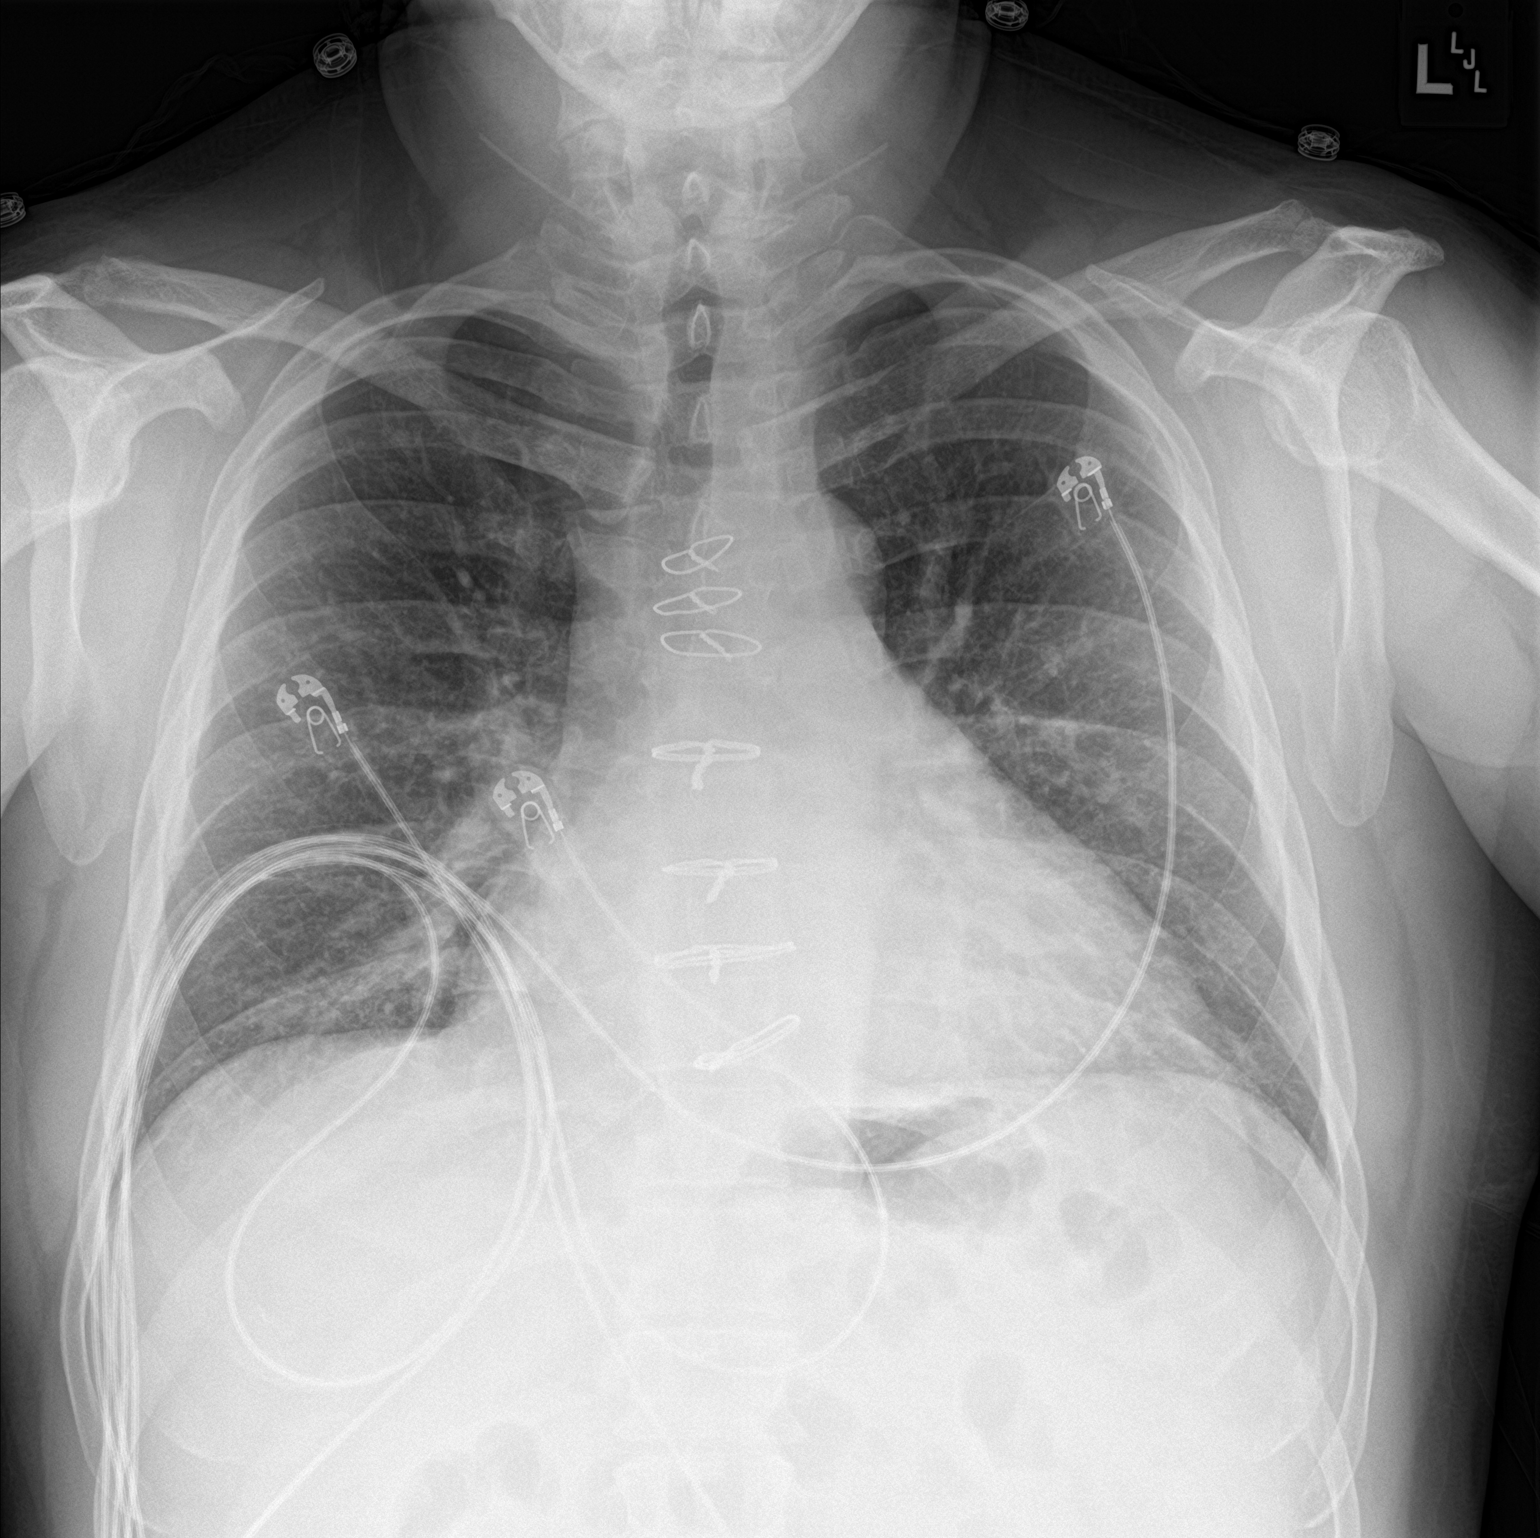

[chest lat]
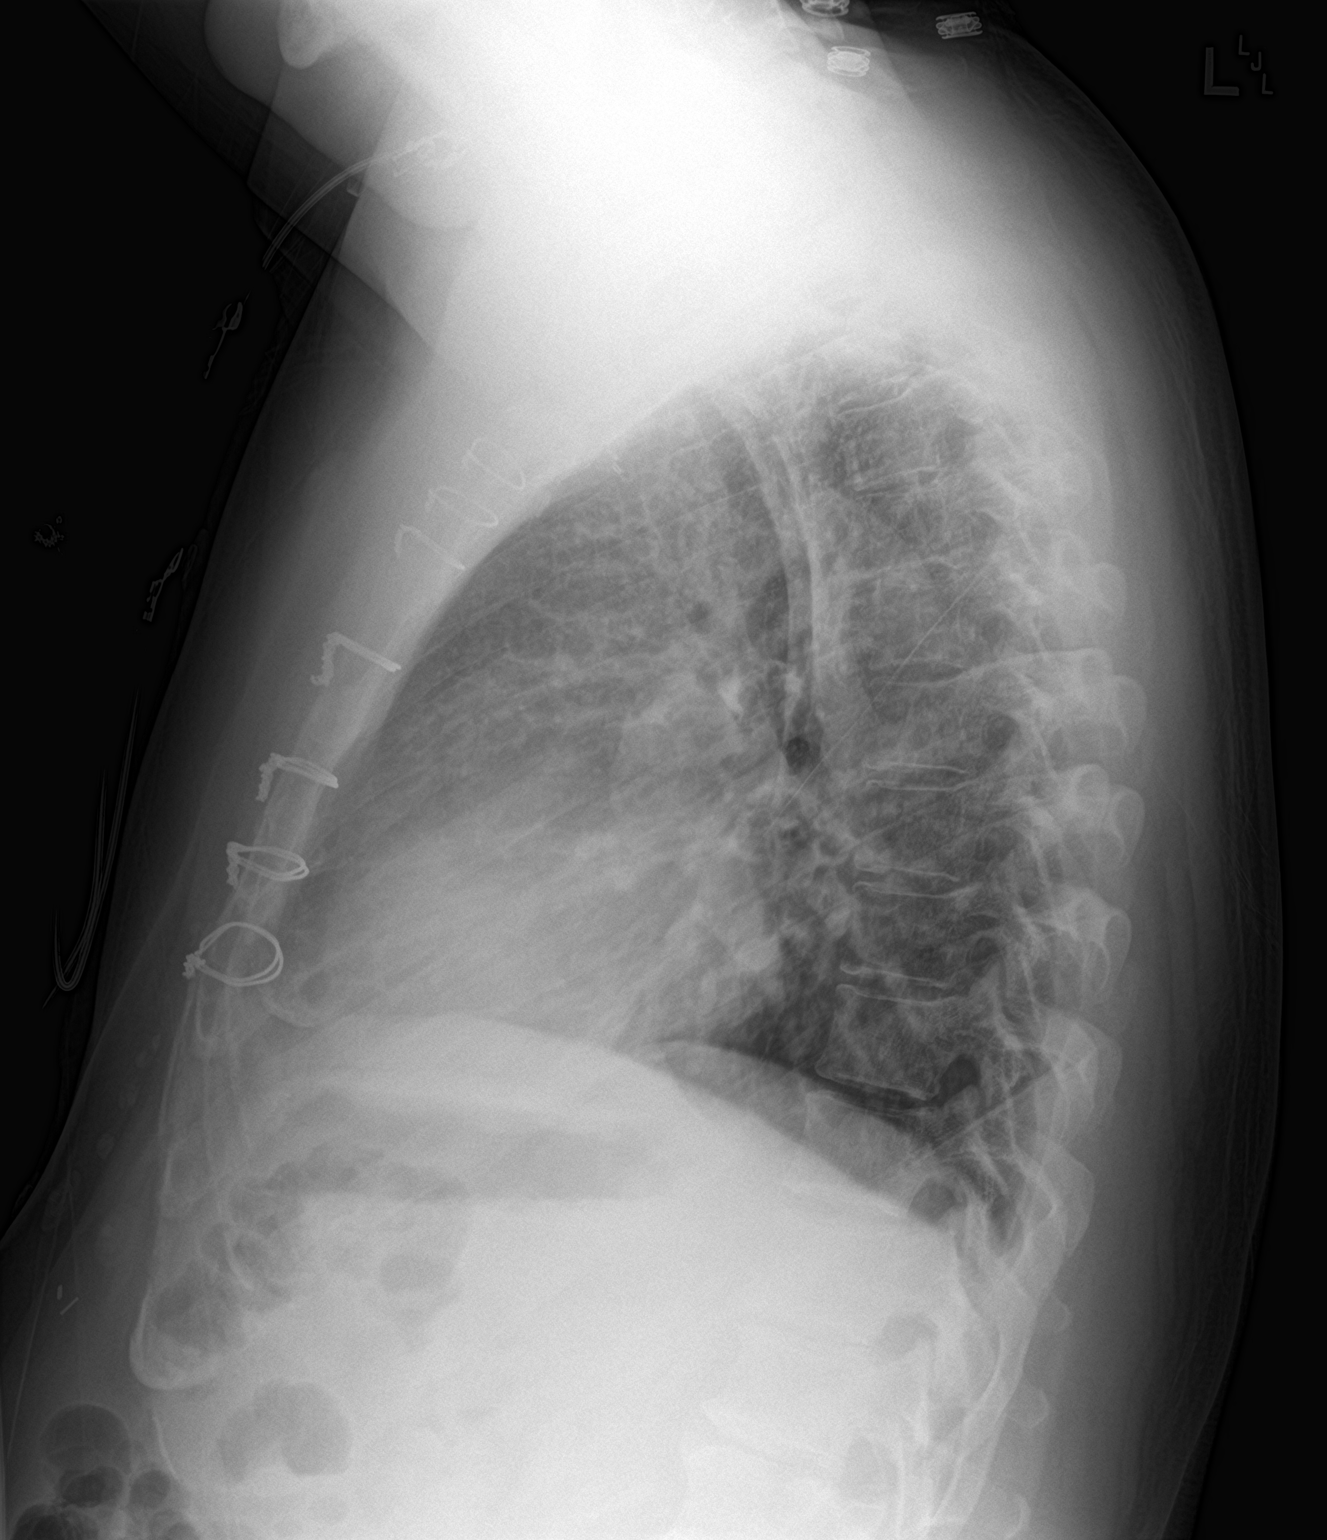

[2 of 2 positions shown; findings below may reference images not displayed]

FINDINGS: Cardiac shadow is enlarged but stable. Postsurgical changes are
again seen. The lungs are well aerated bilaterally. No focal
confluent infiltrate is seen. No sizable effusion is noted. No bony
abnormality is seen.
IMPRESSION: No active cardiopulmonary disease.

## 2022-03-21 NOTE — ED Provider Notes (Signed)
?[facility] EMERGENCY DEPARTMENT ?Provider Note ? ? ?CSN: [id number] ?Arrival date & time: 03/21/22  1836 ? ?  ? ?History ? ?Chief Complaint  ?Patient presents with  ? heart transplant/SOB  ? ? ?Shawn Calderon is a 46 y.o. male. ? ?HPI ? ?  ?46 year old male comes in with chief complaint of cough, back pain, chest pain. Indicates that he has hx of cardiac transplant and is on immunosupressive agents. Pt's daughter got RSV early in the week. He started having congestion and runny nose thereafter. Symptoms have progressed. He feels the same way he did with covid. Has exertional shortness of breath. CP is central, pain is pleuritic. Pt has no hx of PE, DVT and denies any exogenous hormone (testosterone / estrogen) use, long distance travels or surgery in the past 6 weeks, active cancer, recent immobilization. ? ? ?Home Medications ?Prior to Admission medications   ?Medication Sig Start Date End Date Taking? Authorizing Provider  ?acetaminophen (TYLENOL) 500 MG tablet Take 1,000 mg by mouth 2 (two) times daily as needed for mild pain, fever or headache.    [provider]  ?cetirizine (ZYRTEC ALLERGY) 10 MG tablet Take 1 tablet (10 mg total) by mouth daily. 03/01/22   Junie Spencer, FNP  ?citalopram (CELEXA) 20 MG tablet Take 30 mg by mouth at bedtime.  09/01/18 01/18/21  [provider]  ?clonazePAM (KLONOPIN) 0.5 MG tablet Take 1 mg by mouth 2 (two) times daily as needed for anxiety. 09/01/18   [provider]  ?doxycycline (MONODOX) 100 MG capsule Take 1 capsule (100 mg total) by mouth 2 (two) times daily. 03/01/22   Junie Spencer, FNP  ?loperamide (IMODIUM A-D) 2 MG tablet Take 1 tablet (2 mg total) by mouth 4 (four) times daily as needed for diarrhea or loose stools. 04/24/20   Kirt Boys, MD  ?Multiple Vitamin (MULTIVITAMIN WITH MINERALS) TABS tablet Take 1 tablet by mouth daily.    [provider]  ?mycophenolate (CELLCEPT) 500 MG tablet Take 500 mg by  mouth. 3 tablets twice a day 09/05/21   [provider]  ?rosuvastatin (CRESTOR) 20 MG tablet Take 20 mg by mouth at bedtime. 01/19/20   [provider]  ?tacrolimus (PROGRAF) 1 MG capsule Take 2 mg by mouth 2 (two) times daily.  09/01/18   [provider]  ?   ? ?Allergies    ?Sulfamethoxazole, Amoxicillin-pot clavulanate, and Clindamycin   ? ?Review of Systems   ?Review of Systems  ?All other systems reviewed and are negative. ? ?Physical Exam ?Updated Vital Signs ?BP 133/87 (BP Location: Left Arm)   Pulse 93   Temp 99.9 ?F (37.7 ?C) (Oral)   Resp 18   Ht 5\' 10"  (1.778 m)   Wt 95.3 kg   SpO2 96%   BMI 30.13 kg/m?  ?Physical Exam ?Vitals and nursing note reviewed.  ?Constitutional:   ?   Appearance: He is well-developed.  ?HENT:  ?   Head: Atraumatic.  ?Cardiovascular:  ?   Rate and Rhythm: Normal rate.  ?Pulmonary:  ?   Effort: Pulmonary effort is normal.  ?   Breath sounds: No wheezing or rales.  ?Musculoskeletal:  ?   Cervical back: Neck supple.  ?Skin: ?   General: Skin is warm.  ?Neurological:  ?   Mental Status: He is alert and oriented to person, place, and time.  ? ? ?ED Results / Procedures / Treatments   ?Labs ?(all labs ordered are listed, but only  abnormal results are displayed) ?Labs Reviewed  ?RESPIRATORY PANEL BY PCR - Abnormal; Notable for the following components:  ?    Result Value  ? Respiratory Syncytial Virus DETECTED (*)   ? All other components within normal limits  ?BASIC METABOLIC PANEL - Abnormal; Notable for the following components:  ? Glucose, Bld 126 (*)   ? Creatinine, Ser 1.43 (*)   ? All other components within normal limits  ?RESP PANEL BY RT-PCR (FLU A&B, COVID) ARPGX2  ?CBC  ?BRAIN NATRIURETIC PEPTIDE  ?TROPONIN I (HIGH SENSITIVITY)  ?TROPONIN I (HIGH SENSITIVITY)  ? ? ?EKG ?None ? ?Radiology ?DG Chest 2 View ? ?Result Date: 03/21/2022 ?CLINICAL DATA:  Chest pain and shortness of breath EXAM: CHEST - 2 VIEW COMPARISON:  04/22/2020 FINDINGS: Cardiac  shadow is enlarged but stable. Postsurgical changes are again seen. The lungs are well aerated bilaterally. No focal confluent infiltrate is seen. No sizable effusion is noted. No bony abnormality is seen. IMPRESSION: No active cardiopulmonary disease. Electronically Signed   By: Alcide Clever M.D.   On: 03/21/2022 19:43   ? ?Procedures ?Procedures  ? ? ?Medications Ordered in ED ?Medications - No data to display ? ?ED Course/ Medical Decision Making/ A&P ?  ?                        ?Medical Decision Making ?Amount and/or Complexity of Data Reviewed ?Labs: ordered. ?Radiology: ordered. ? ? ?This patient presents to the ED with chief complaint(s) of cough, cp, shob, back pain with pertinent past medical history of heart transplant and exposure to daughter who had severe rsv. ? ?The differential diagnosis includes viral illness, myocarditis, pericarditis, pe, chf, pna  ? ?The initial plan is to order basic labs and sent viral resp panels given the immunosuppression. ? ? ?Additional history obtained: ?Records reviewed Care Everywhere/External Records ? ? ?Independent visualization of imaging: ?- Imaging that was independently visualized with scope of interpretation limited to determining acute life threatening conditions related to emergency care: xrays of the chest, which revealed no evidence of focal pneumonia ? ?Treatment and Reassessment: ?All results back except resp panel. ?Results discussed with the patient. No treatment for now, he is comfortable when resting on room air. No nebs needed. ? ?If resp panel is neg, we will treat empirically for pna.  ? ?11:19 PM ?RSV +. ?RN requested to complete ambulatory pulsox before patient leaves. ?Strict ER return precautions have been discussed, and patient is agreeing with the plan and is comfortable with the workup done and the recommendations from the ER. ? ? ? ?Final Clinical Impression(s) / ED Diagnoses ?Final diagnoses:  ?RSV (acute bronchiolitis due to respiratory  syncytial virus)  ? ? ?Rx / DC Orders ?ED Discharge Orders   ? ? None  ? ?  ? ? ?  ?Derwood Kaplan, MD ?03/21/22 2320 ? ?

## 2022-03-21 NOTE — Discharge Instructions (Signed)
Your work-up reveals that you have RSV infection.  COVID-19, flu test is negative. ?X-ray does not indicate any evidence of heart failure, your heart enzymes are normal. ? ?Likely symptoms will improve over time. ?Continue to hydrate really well. ? ?Return to the ER if your symptoms get worse. ?

## 2022-03-21 NOTE — ED Triage Notes (Signed)
Pt here POV w/ complaints of cough, back pain, and chest pain in central chest when coughing, shortness of breath since Monday. Pt's 62 month old daughter recently dx with RSV. Pt immunocompromised with hx heart transplant 15 yrs ago. Denies n/v, endorses some diarrhea.  ?

## 2022-04-07 ENCOUNTER — Telehealth: Payer: PRIVATE HEALTH INSURANCE | Admitting: Nurse Practitioner

## 2022-04-07 DIAGNOSIS — J40 Bronchitis, not specified as acute or chronic: Secondary | ICD-10-CM

## 2022-04-07 MED ORDER — CEFDINIR 300 MG PO CAPS
300.0000 mg | ORAL_CAPSULE | Freq: Two times a day (BID) | ORAL | 0 refills | Status: DC
Start: 2022-04-07 — End: 2022-06-03

## 2022-04-07 MED ORDER — PREDNISONE 20 MG PO TABS: 40.0000 mg | ORAL_TABLET | Freq: Every day | ORAL | 0 refills | Status: AC

## 2022-04-07 MED ORDER — PROMETHAZINE-DM 6.25-15 MG/5ML PO SYRP
5.0000 mL | ORAL_SOLUTION | Freq: Four times a day (QID) | ORAL | 0 refills | Status: DC | PRN
Start: 2022-04-07 — End: 2023-03-25

## 2022-04-07 NOTE — Patient Instructions (Signed)
1. Take meds as prescribed 2. Use a cool mist humidifier especially during the winter months and when heat has been humid. 3. Use saline nose sprays frequently 4. Saline irrigations of the nose can be very helpful if done frequently.  * 4X daily for 1 week*  * Use of a nettie pot can be helpful with this. Follow directions with this* 5. Drink plenty of fluids 6. Keep thermostat turn down low 7.For any cough or congestion- promethazine dm 8. For fever or aces or pains- take tylenol or ibuprofen appropriate for age and weight.  * for fevers greater than 101 orally you may alternate ibuprofen and tylenol every  3 hours.    

## 2022-04-07 NOTE — Progress Notes (Signed)
? ?Virtual Visit Consent  ? ?Shawn Calderon, you are scheduled for a virtual visit with Shawn Daphine Deutscher, FNP, a [facility] provider, today.   ?  ?Just as with appointments in the office, your consent must be obtained to participate.  Your consent will be active for this visit and any virtual visit you may have with one of our providers in the next 365 days.   ?  ?If you have a MyChart account, a copy of this consent can be sent to you electronically.  All virtual visits are billed to your insurance company just like a traditional visit in the office.   ? ?As this is a virtual visit, video technology does not allow for your provider to perform a traditional examination.  This may limit your provider's ability to fully assess your condition.  If your provider identifies any concerns that need to be evaluated in person or the need to arrange testing (such as labs, EKG, etc.), we will make arrangements to do so.   ?  ?Although advances in technology are sophisticated, we cannot ensure that it will always work on either your end or our end.  If the connection with a video visit is poor, the visit may have to be switched to a telephone visit.  With either a video or telephone visit, we are not always able to ensure that we have a secure connection.    ? ?I need to obtain your verbal consent now.   Are you willing to proceed with your visit today? YES ?  ?FUE CERVENKA has provided verbal consent on 04/07/2022 for a virtual visit (video or telephone). ?  ?Shawn Daphine Deutscher, FNP  ? ?Date: 04/07/2022 6:35 PM ? ? ?Virtual Visit via Video Note  ? ?I, Shawn Calderon, connected with Shawn Calderon ([id number], February 10, 1976) on 04/07/22 at  6:45 PM EDT by a video-enabled telemedicine application and verified that I am speaking with the correct person using two identifiers. ? ?Location: ?Patient: Virtual Visit Location Patient: Home ?Provider: Virtual Visit Location Provider: Mobile ?  ?I discussed the limitations  of evaluation and management by telemedicine and the availability of in person appointments. The patient expressed understanding and agreed to proceed.   ? ?History of Present Illness: ?Shawn Calderon is a 46 y.o. who identifies as a male who was assigned male at birth, and is being seen today for cough. ? ?HPI: Patient does video visit today c/o cough. He is a heart transplant patient. He was seen in urgent care 2 weeks ago and was dx with RSV that he got from his daughter. His cough has worsened since then. Cough is causing heart burn. ?He is cannot tolerate PCN, or doxycycline  ?  ?Review of Systems  ?Constitutional:  Negative for chills, fever and malaise/fatigue.  ?HENT:  Positive for congestion. Negative for sinus pain and sore throat.   ?Respiratory:  Positive for cough. Negative for sputum production.   ?Cardiovascular:  Negative for chest pain and PND.  ?Musculoskeletal:  Negative for myalgias.  ?Neurological:  Negative for headaches.  ? ?Problems:  ?Patient Active Problem List  ? Diagnosis Date Noted  ? Left leg pain 11/27/2020  ? Intractable left heel pain 11/27/2020  ? Upper respiratory infection 09/26/2020  ? Chronic diarrhea 09/05/2020  ? Ear fullness, left 06/04/2020  ? COVID-19 virus infection 04/23/2020  ? Intractable vomiting 04/02/2020  ? Bilateral impacted cerumen 09/28/2019  ? 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  Hyperlipidemia 12/27/2012  ? Heart transplant status (HCC) 11/24/2011  ? Hypertension 11/24/2011  ? OCD (obsessive compulsive disorder) 11/24/2011  ?  ?Allergies:  ?Allergies  ?Allergen Reactions  ? Sulfamethoxazole Hives and Rash  ? Amoxicillin-Pot Clavulanate Diarrhea  ? Clindamycin Other (See Comments)  ?  Heartburn  ? ?Medications:  ?Current Outpatient Medications:  ?  acetaminophen (TYLENOL) 500 MG tablet, Take 1,000 mg by mouth 2 (two) times daily as needed for mild pain, fever or headache., Disp: , Rfl:  ?  cetirizine (ZYRTEC ALLERGY) 10 MG  tablet, Take 1 tablet (10 mg total) by mouth daily., Disp: 90 tablet, Rfl: 1 ?  citalopram (CELEXA) 20 MG tablet, Take 30 mg by mouth at bedtime. , Disp: , Rfl:  ?  clonazePAM (KLONOPIN) 0.5 MG tablet, Take 1 mg by mouth 2 (two) times daily as needed for anxiety., Disp: , Rfl:  ?  doxycycline (MONODOX) 100 MG capsule, Take 1 capsule (100 mg total) by mouth 2 (two) times daily., Disp: 20 capsule, Rfl: 0 ?  loperamide (IMODIUM A-D) 2 MG tablet, Take 1 tablet (2 mg total) by mouth 4 (four) times daily as needed for diarrhea or loose stools., Disp: 60 tablet, Rfl: 0 ?  Multiple Vitamin (MULTIVITAMIN WITH MINERALS) TABS tablet, Take 1 tablet by mouth daily., Disp: , Rfl:  ?  mycophenolate (CELLCEPT) 500 MG tablet, Take 500 mg by mouth. 3 tablets twice a day, Disp: , Rfl:  ?  rosuvastatin (CRESTOR) 20 MG tablet, Take 20 mg by mouth at bedtime., Disp: , Rfl:  ?  tacrolimus (PROGRAF) 1 MG capsule, Take 2 mg by mouth 2 (two) times daily. , Disp: , Rfl:  ? ?Observations/Objective: ?Patient is well-developed, well-nourished in no acute distress.  ?Resting comfortably  at home.  ?Head is normocephalic, atraumatic.  ?No labored breathing.  ?Speech is clear and coherent with logical content.  ?Patient is alert and oriented at baseline.  ?Raspy voice ?Deep dry cough ? ?Assessment and Plan: ? ?Shawn Calderon in today with chief complaint of No chief complaint on file. ? ? ?1. Bronchitis ?1. Take meds as prescribed ?2. Use a cool mist humidifier especially during the winter months and when heat has been humid. ?3. Use saline nose sprays frequently ?4. Saline irrigations of the nose can be very helpful if done frequently. ? * 4X daily for 1 week* ? * Use of a nettie pot can be helpful with this. Follow directions with this* ?5. Drink plenty of fluids ?6. Keep thermostat turn down low ?7.For any cough or congestion- promethazine DM ?8. For fever or aces or pains- take tylenol or ibuprofen appropriate for age and weight. ? * for  fevers greater than 101 orally you may alternate ibuprofen and tylenol every  3 hours. ?  ?Meds ordered this encounter  ?Medications  ? cefdinir (OMNICEF) 300 MG capsule  ?  Sig: Take 1 capsule (300 mg total) by mouth 2 (two) times daily. 1 po BID  ?  Dispense:  20 capsule  ?  Refill:  0  ?  Order Specific Question:   Supervising Provider  ?  Answer:   Eber Hong [[id number]]  ? promethazine-dextromethorphan (PROMETHAZINE-DM) 6.25-15 MG/5ML syrup  ?  Sig: Take 5 mLs by mouth 4 (four) times daily as needed for cough.  ?  Dispense:  118 mL  ?  Refill:  0  ?  Order Specific Question:   Supervising Provider  ?  Answer:   Eber Hong [[id number]]  ? predniSONE (  DELTASONE) 20 MG tablet  ?  Sig: Take 2 tablets (40 mg total) by mouth daily with breakfast for 5 days. 2 po daily for 5 days  ?  Dispense:  10 tablet  ?  Refill:  0  ?  Order Specific Question:   Supervising Provider  ?  Answer:   Eber HongMILLER, BRIAN [[id number]]  ? ?Avoided zpak due possible issues with QT interval and him being a heart transplant patient ? ? ? ?Follow Up Instructions: ?I discussed the assessment and treatment plan with the patient. The patient was provided an opportunity to ask questions and all were answered. The patient agreed with the plan and demonstrated an understanding of the instructions.  A copy of instructions were sent to the patient via MyChart. ? ?The patient was advised to call back or seek an in-person evaluation if the symptoms worsen or if the condition fails to improve as anticipated. ? ?Time:  ?I spent 13 minutes with the patient via telehealth technology discussing the above problems/concerns.   ? ?Shawn Daphine DeutscherMartin, FNP ? ?

## 2022-04-23 ENCOUNTER — Encounter: Payer: Self-pay | Admitting: Internal Medicine

## 2022-04-23 ENCOUNTER — Ambulatory Visit (AMBULATORY_SURGERY_CENTER): Payer: PRIVATE HEALTH INSURANCE | Admitting: Internal Medicine

## 2022-04-23 VITALS — BP 121/88 | HR 57 | Temp 97.3°F | Resp 18 | Ht 70.0 in | Wt 213.0 lb

## 2022-04-23 DIAGNOSIS — K219 Gastro-esophageal reflux disease without esophagitis: Secondary | ICD-10-CM

## 2022-04-23 DIAGNOSIS — K2100 Gastro-esophageal reflux disease with esophagitis, without bleeding: Secondary | ICD-10-CM

## 2022-04-23 DIAGNOSIS — R140 Abdominal distension (gaseous): Secondary | ICD-10-CM | POA: Diagnosis not present

## 2022-04-23 DIAGNOSIS — K648 Other hemorrhoids: Secondary | ICD-10-CM

## 2022-04-23 DIAGNOSIS — K5730 Diverticulosis of large intestine without perforation or abscess without bleeding: Secondary | ICD-10-CM | POA: Diagnosis not present

## 2022-04-23 DIAGNOSIS — K222 Esophageal obstruction: Secondary | ICD-10-CM

## 2022-04-23 DIAGNOSIS — R197 Diarrhea, unspecified: Secondary | ICD-10-CM

## 2022-04-23 DIAGNOSIS — K625 Hemorrhage of anus and rectum: Secondary | ICD-10-CM | POA: Diagnosis not present

## 2022-04-23 DIAGNOSIS — Z1211 Encounter for screening for malignant neoplasm of colon: Secondary | ICD-10-CM

## 2022-04-23 MED ORDER — SODIUM CHLORIDE 0.9 % IV SOLN: 500.0000 mL | Freq: Once | INTRAVENOUS | Status: DC

## 2022-04-23 MED ORDER — OMEPRAZOLE 40 MG PO CPDR: 40.0000 mg | DELAYED_RELEASE_CAPSULE | Freq: Every day | ORAL | 11 refills | Status: DC

## 2022-04-23 NOTE — Patient Instructions (Signed)
YOU HAD AN ENDOSCOPIC PROCEDURE TODAY AT THE [facility]:   Refer to the procedure report that was given to you for any specific questions about what was found during the examination.  If the procedure report does not answer your questions, please call your gastroenterologist to clarify.  If you requested that your care partner not be given the details of your procedure findings, then the procedure report has been included in a sealed envelope for you to review at your convenience later.  YOU SHOULD EXPECT: Some feelings of bloating in the abdomen. Passage of more gas than usual.  Walking can help get rid of the air that was put into your GI tract during the procedure and reduce the bloating. If you had a lower endoscopy (such as a colonoscopy or flexible sigmoidoscopy) you may notice spotting of blood in your stool or on the toilet paper. If you underwent a bowel prep for your procedure, you may not have a normal bowel movement for a few days.  Please Note:  You might notice some irritation and congestion in your nose or some drainage.  This is from the oxygen used during your procedure.  There is no need for concern and it should clear up in a day or so.  SYMPTOMS TO REPORT IMMEDIATELY:   Following lower endoscopy (colonoscopy or flexible sigmoidoscopy):  Excessive amounts of blood in the stool  Significant tenderness or worsening of abdominal pains  Swelling of the abdomen that is new, acute  Fever of 100F or higher   Following upper endoscopy (EGD)  Vomiting of blood or coffee ground material  New chest pain or pain under the shoulder blades  Painful or persistently difficult swallowing  New shortness of breath  Fever of 100F or higher  Black, tarry-looking stools  For urgent or emergent issues, a gastroenterologist can be reached at any hour by calling [phone]. Do not use MyChart messaging for urgent concerns.    DIET:  We do recommend a small meal at first, but  then you may proceed to your regular diet.  Drink plenty of fluids but you should avoid alcoholic beverages for 24 hours.  ACTIVITY:  You should plan to take it easy for the rest of today and you should NOT DRIVE or use heavy machinery until tomorrow (because of the sedation medicines used during the test).    FOLLOW UP: Our staff will call the number listed on your records 48-72 hours following your procedure to check on you and address any questions or concerns that you may have regarding the information given to you following your procedure. If we do not reach you, we will leave a message.  We will attempt to reach you two times.  During this call, we will ask if you have developed any symptoms of COVID 19. If you develop any symptoms (ie: fever, flu-like symptoms, shortness of breath, cough etc.) before then, please call [phone].  If you test positive for Covid 19 in the 2 weeks post procedure, please call and report this information to us.    If any biopsies were taken you will be contacted by phone or by letter within the next 1-3 weeks.  Please call us at [phone] if you have not heard about the biopsies in 3 weeks.    SIGNATURES/CONFIDENTIALITY: You and/or your care partner have signed paperwork which will be entered into your electronic medical record.  These signatures attest to the fact that that the information above on   your After Visit Summary has been reviewed and is understood.  Full responsibility of the confidentiality of this discharge information lies with you and/or your care-partner. 

## 2022-04-23 NOTE — Op Note (Signed)
[facility] ?Patient Name: Shawn Calderon ?Procedure Date: 04/23/2022 12:07 PM ?MRN: [id number] ?Endoscopist: Docia Chuck. Henrene Pastor , MD ?Age: 46 ?Referring MD:  ?Date of Birth: Aug 05, 1976 ?Gender: Male ?Account #: [id number] ?Procedure:                Colonoscopy with biopsies ?Indications:              Chronic diarrhea, minor rectal bleeding, colon  ?                          cancer screening ?Medicines:                Monitored Anesthesia Care ?Procedure:                Pre-Anesthesia Assessment: ?                          - Prior to the procedure, a History and Physical  ?                          was performed, and patient medications and  ?                          allergies were reviewed. The patient's tolerance of  ?                          previous anesthesia was also reviewed. The risks  ?                          and benefits of the procedure and the sedation  ?                          options and risks were discussed with the patient.  ?                          All questions were answered, and informed consent  ?                          was obtained. Prior Anticoagulants: The patient has  ?                          taken no previous anticoagulant or antiplatelet  ?                          agents. ASA Grade Assessment: III - A patient with  ?                          severe systemic disease. After reviewing the risks  ?                          and benefits, the patient was deemed in  ?                          satisfactory condition to undergo the procedure. ?  After obtaining informed consent, the colonoscope  ?                          was passed under direct vision. Throughout the  ?                          procedure, the patient's blood pressure, pulse, and  ?                          oxygen saturations were monitored continuously. The  ?                          CF [id number] #[id number] was introduced through the anus  ?                          and advanced to the the  cecum, identified by  ?                          appendiceal orifice and ileocecal valve. The  ?                          terminal ileum, ileocecal valve, appendiceal  ?                          orifice, and rectum were photographed. The quality  ?                          of the bowel preparation was good. The colonoscopy  ?                          was performed without difficulty. The patient  ?                          tolerated the procedure well. The bowel preparation  ?                          used was SUPREP via split dose instruction. ?Scope In: 12:15:03 PM ?Scope Out: 12:27:46 PM ?Scope Withdrawal Time: 0 hours 10 minutes 57 seconds  ?Total Procedure Duration: 0 hours 12 minutes 43 seconds  ?Findings:                 The terminal ileum appeared normal. Biopsies were  ?                          taken with a cold forceps for histology. ?                          Scattered diverticula were found in the entire  ?                          colon. Small internal hemorrhoids. ?                          The exam was otherwise without abnormality on  ?  direct and retroflexion views. Random colon  ?                          biopsies taken to rule out microscopic colitis. ?Complications:            No immediate complications. Estimated blood loss:  ?                          None. ?Estimated Blood Loss:     Estimated blood loss: none. ?Impression:               - The examined portion of the ileum was normal.  ?                          Biopsied. ?                          - Diverticulosis in the entire examined colon.  ?                          Small internal hemorrhoids. ?                          - The examination was otherwise normal on direct  ?                          and retroflexion views. Random colon biopsies taken  ?                          to rule out microscopic colitis ?Recommendation:           - Repeat colonoscopy in 10 years for screening  ?                           purposes. ?                          - Patient has a contact number available for  ?                          emergencies. The signs and symptoms of potential  ?                          delayed complications were discussed with the  ?                          patient. Return to normal activities tomorrow.  ?                          Written discharge instructions were provided to the  ?                          patient. ?                          - Resume previous diet. ?                          -  Continue present medications. ?                          - Await pathology results. ?                          -See EGD for findings and final recommendations ?Docia Chuck. Henrene Pastor, MD ?04/23/2022 12:40:55 PM ?This report has been signed electronically. ?

## 2022-04-23 NOTE — Progress Notes (Signed)
HISTORY OF PRESENT ILLNESS: ?  ?Shawn Calderon is a 46 y.o. 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  The patient tells me that his chronic problems began a few years ago.  He describes approximately 7 bowel movements per day without form.  3 times per week this will wake him from sleep.  Symptoms are exacerbated by meals.  There has been some minor rectal bleeding and dark stools.  He states that he lives on Imodium and Pepto-Bismol.  He has not had any evaluation for this issue.  It was recommended by his [facility] transplant team that he seek GI assessment.  He denies any new or changing medications around the time of symptom development.  He does drink mostly bottled water but does have well water at his residence.  He tells me that a young friend of his was diagnosed with metastatic colon cancer.  He is concerned.  Review of x-ray file shows CT scan from April 01, 2020 to evaluate nausea vomiting and abdominal pain.  The examination revealed enteritis with a diarrheal state as well as diverticulosis.  Review of blood work from [facility] December 10, 2021 shows normal PSA, normal CBC with differential, normal tacrolimus level, normal magnesium, normal hemoglobin A1c, mildly elevated CK, creatinine 1.4, normal liver tests, albumin, and proteins.  Normal potassium. ?  ?REVIEW OF SYSTEMS: ?  ?All non-GI ROS negative unless otherwise stated in the HPI except for sinus and allergy trouble ?  ?    ?Past Medical History:  ?Diagnosis Date  ? Heart transplant recipient [facility]) 2008  ?  ?  ?History reviewed. No pertinent surgical history. ?  ?Social History ?[name]  reports that he has never smoked. He has never used smokeless tobacco. He reports that he does not currently use alcohol. He reports that he does not currently use drugs. ?  ?family history  includes Diverticulosis in his father and mother. ?  ?     ?Allergies  ?Allergen Reactions  ? Sulfamethoxazole Hives and Rash  ? Amoxicillin-Pot Clavulanate Diarrhea  ? Clindamycin Other (See Comments)  ?    Heartburn  ?  ?  ?  ?  ?PHYSICAL EXAMINATION: ?Vital signs: BP 120/78   Pulse 81   Ht 5\' 10"  (1.778 m)   Wt 213 lb (96.6 kg)   SpO2 96%   BMI 30.56 kg/m?   ?Constitutional: generally well-appearing, no acute distress ?Psychiatric: alert and oriented x3, cooperative ?Eyes: extraocular movements intact, anicteric, conjunctiva pink ?Mouth: oral pharynx moist, no lesions ?Neck: supple no lymphadenopathy ?Cardiovascular: heart regular rate and rhythm, no murmur ?Lungs: clear to auscultation bilaterally ?Abdomen: soft, nontender, nondistended, no obvious ascites, no peritoneal signs, normal bowel sounds, no organomegaly ?Rectal: Deferred until colonoscopy ?Extremities: no clubbing, cyanosis, or lower extremity edema bilaterally ?Skin: no lesions on visible extremities ?Neuro: No focal deficits.  Cranial nerves intact ?  ?ASSESSMENT: ?  ?1.  Chronic problems with diarrhea and abdominal bloating as described above.  Etiology unclear.  Broad differential, which we discussed. ?2.  Dark stools likely related to Pepto-Bismol ?3.  Monitor rectal bleed ?4.  Colon cancer screening ?5.  Status post heart transplant on immunosuppressive therapy.  Doing well ?  ?  ?PLAN: ?  ?1.  CBC, comprehensive metabolic panel, ?2.  Celiac testing ?3.  Upper endoscopy to evaluate postprandial abdominal bloating discomfort and  diarrhea.The nature of the procedure, as well as the risks, benefits, and alternatives were carefully and thoroughly reviewed with the patient. Ample time for discussion and questions allowed. The patient understood, was satisfied, and agreed to proceed.  ?4.  Colonoscopy for colon cancer screening and to evaluate abdominal complaints.The nature of the procedure, as well as the risks, benefits, and alternatives were  carefully and thoroughly reviewed with the patient. Ample time for discussion and questions allowed. The patient understood, was satisfied, and agreed to proceed.  ?5.  Consider advanced imaging ?6.  Consider course of therapy for bacterial overgrowth such as Xifaxan ?7.  Follow-up after the above completed ?

## 2022-04-23 NOTE — Progress Notes (Signed)
Called to room to assist during endoscopic procedure.  Patient ID and intended procedure confirmed with present staff. Received instructions for my participation in the procedure from the performing physician.  

## 2022-04-23 NOTE — Progress Notes (Signed)
Report to PACU, RN, vss, BBS= Clear.  

## 2022-04-23 NOTE — Op Note (Signed)
[place] ?Patient Name: Chistopher Arbanas ?Procedure Date: 04/23/2022 12:07 PM ?MRN: [id number] ?Endoscopist: Docia Chuck. Henrene Pastor , MD ?Age: 46 ?Referring MD:  ?Date of Birth: [birth year] ?Gender: Male ?Account #: [id number] ?Procedure:                Upper GI endoscopy with biopsies ?Indications:              Abdominal bloating, Diarrhea, dark stools ?Medicines:                Monitored Anesthesia Care ?Procedure:                Pre-Anesthesia Assessment: ?                          - Prior to the procedure, a History and Physical  ?                          was performed, and patient medications and  ?                          allergies were reviewed. The patient's tolerance of  ?                          previous anesthesia was also reviewed. The risks  ?                          and benefits of the procedure and the sedation  ?                          options and risks were discussed with the patient.  ?                          All questions were answered, and informed consent  ?                          was obtained. Prior Anticoagulants: The patient has  ?                          taken no previous anticoagulant or antiplatelet  ?                          agents. ASA Grade Assessment: III - A patient with  ?                          severe systemic disease. After reviewing the risks  ?                          and benefits, the patient was deemed in  ?                          satisfactory condition to undergo the procedure. ?                          After obtaining informed consent, the endoscope was  ?  passed under direct vision. Throughout the  ?                          procedure, the patient's blood pressure, pulse, and  ?                          oxygen saturations were monitored continuously. The  ?                          GIF [id number] was introduced through the  ?                          mouth, and advanced to the second part of duodenum.  ?                           The upper GI endoscopy was accomplished without  ?                          difficulty. The patient tolerated the procedure  ?                          well. ?Scope In: ?Scope Out: ?Findings:                 The esophagus revealed reflux esophagitis and a  ?                          distal esophageal stricture. No Barrett's. ?                          The stomach revealed mild erythema in the hiatal  ?                          hernia. Otherwise normal. ?                          The examined duodenum was normal. Biopsies for  ?                          histology were taken with a cold forceps for  ?                          evaluation of celiac disease. ?                          The cardia and gastric fundus were normal on  ?                          retroflexion. ?Complications:            No immediate complications. ?Estimated Blood Loss:     Estimated blood loss: none. ?Impression:               1. GERD with esophagitis and stricture ?                          2. Otherwise unremarkable  EGD status post duodenal  ?                          biopsies. ?Recommendation:           1. Patient has a contact number available for  ?                          emergencies. The signs and symptoms of potential  ?                          delayed complications were discussed with the  ?                          patient. Return to normal activities tomorrow.  ?                          Written discharge instructions were provided to the  ?                          patient. ?                          2. Resume previous diet. ?                          3. Continue present medications. ?                          4. Prescribe omeprazole 40 mg daily; #30; 11  ?                          refills. This will help heal your esophageal  ?                          inflammation and prevent further damage ?                          5. Await pathology results. We will contact you  ?                          with the results and provide  additional  ?                          recommendations and follow-up. ?Docia Chuck. Henrene Pastor, MD ?04/23/2022 12:45:26 PM ?This report has been signed electronically. ?

## 2022-04-27 ENCOUNTER — Telehealth: Payer: Self-pay | Admitting: *Deleted

## 2022-04-27 NOTE — Telephone Encounter (Signed)
?  Follow up Call- ? ? ?  04/23/2022  ? 11:33 AM  ?Call back number  ?Post procedure Call Back phone  # [phone]  ?Permission to leave phone message Yes  ?  ? ?Patient questions: ? ?Do you have a fever, pain , or abdominal swelling? No. ?Pain Score  0 * ? ?Have you tolerated food without any problems? Yes.   ? ?Have you been able to return to your normal activities? Yes.   ? ?Do you have any questions about your discharge instructions: ?Diet   No. ?Medications  No. ?Follow up visit  No. ? ?Do you have questions or concerns about your Care? No. ? ?Actions: ?* If pain score is 4 or above: ?No action needed, pain <4. ? ? ?

## 2022-04-28 ENCOUNTER — Encounter: Payer: Self-pay | Admitting: Internal Medicine

## 2022-04-28 ENCOUNTER — Other Ambulatory Visit: Payer: Self-pay

## 2022-04-28 MED ORDER — RIFAXIMIN 550 MG PO TABS
550.0000 mg | ORAL_TABLET | Freq: Three times a day (TID) | ORAL | 0 refills | Status: DC
Start: 2022-04-28 — End: 2022-05-21

## 2022-05-21 ENCOUNTER — Telehealth: Payer: Self-pay | Admitting: Internal Medicine

## 2022-05-21 ENCOUNTER — Other Ambulatory Visit: Payer: Self-pay

## 2022-05-21 MED ORDER — RIFAXIMIN 550 MG PO TABS: 550.0000 mg | ORAL_TABLET | Freq: Three times a day (TID) | ORAL | 0 refills | Status: DC

## 2022-05-21 NOTE — Telephone Encounter (Signed)
Prescribe a second course for 2 weeks.  Thanks

## 2022-05-21 NOTE — Telephone Encounter (Signed)
Spoke with pt and he is aware of Dr. Lamar Sprinkles recommendations. Prescription sent to pharmacy.

## 2022-05-21 NOTE — Telephone Encounter (Signed)
Pt called and states that while he was taking the xifaxan he was doing great. He has been off of it now and about 4-5 days after stopping it he started having frequent diarrhea and stomach bloating. Pt wants to know what Dr. Marina Goodell recommends. Please advise.

## 2022-05-21 NOTE — Telephone Encounter (Signed)
Patient has completed the course of antibiotics following his procedure with Dr. Marina Goodell. Since completing them, his symptoms have returned.  Please call patient and advise.  Thank you.

## 2022-06-03 ENCOUNTER — Encounter: Payer: Self-pay | Admitting: Internal Medicine

## 2022-06-03 ENCOUNTER — Ambulatory Visit (INDEPENDENT_AMBULATORY_CARE_PROVIDER_SITE_OTHER): Payer: PRIVATE HEALTH INSURANCE | Admitting: Internal Medicine

## 2022-06-03 VITALS — BP 90/60 | HR 72 | Ht 68.0 in | Wt 211.2 lb

## 2022-06-03 DIAGNOSIS — K589 Irritable bowel syndrome without diarrhea: Secondary | ICD-10-CM

## 2022-06-03 DIAGNOSIS — K6389 Other specified diseases of intestine: Secondary | ICD-10-CM | POA: Diagnosis not present

## 2022-06-03 MED ORDER — RIFAXIMIN 550 MG PO TABS: 550.0000 mg | ORAL_TABLET | Freq: Three times a day (TID) | ORAL | 1 refills | Status: DC

## 2022-06-03 NOTE — Progress Notes (Signed)
HISTORY OF PRESENT ILLNESS:  Shawn Calderon is a 46 y.o. 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  He was evaluated in the office February 24, 2022 regarding chronic problems with abdominal bloating and diarrhea.  His problems have been going on for a few years.  He describes 7 bowel movements per day without form.  3 times a week this will wake him from sleep.  He would take Imodium and Pepto-Bismol regularly.  See that dictation for details.  CBC, comprehensive metabolic panel, and celiac testing were unremarkable. He subsequently underwent colonoscopy and upper endoscopy to further evaluate this problem.  The procedures were performed Apr 23, 2022.  Colonoscopy including intubation of the terminal ileum revealed diverticulosis and internal hemorrhoids.  No neoplasia.  Biopsies of the ileum and colon were normal. Upper endoscopy revealed a distal esophageal stricture with esophagitis and a hiatal hernia.  Duodenal biopsies were normal.  He was placed on omeprazole.  After the biopsy results returned he was given a course of Xifaxan 550 mg 3 times daily for 2 weeks.  Patient tells me that he had an excellent response to treatment.  His symptoms resolved.  No longer needed Imodium or Pepto-Bismol.  However, symptoms seem to return somewhat about 5 days after completing therapy.  He describes his problem as 10 out of 10 prior to being treated.  0 out of 10 on medication.  And 3 out of 10 when he contacted the office noting some recurrence of symptoms.  He was prescribed a second 2 weeks of Xifaxan, which he is completing now.  He feels well.  No new complaints or appreciable medication side effects.  He does have questions  REVIEW OF SYSTEMS:  All non-GI ROS negative unless otherwise stated in the HPI except for muscle cramps  Past Medical History:  Diagnosis Date   CHF (congestive heart failure) (HCC)    Heart transplant  recipient [facility]) 2008    Past Surgical History:  Procedure Laterality Date   APPENDECTOMY     COLONOSCOPY     heart transplant  2008   heart transplant recipient    Social History Shawn Calderon  reports that he has never smoked. He has never used smokeless tobacco. He reports that he does not currently use alcohol. He reports that he does not currently use drugs.  family history includes Diverticulosis in his father and mother.  Allergies  Allergen Reactions   Sulfamethoxazole Hives and Rash   Amoxicillin-Pot Clavulanate Diarrhea   Clindamycin Other (See Comments)    Heartburn       PHYSICAL EXAMINATION: Vital signs: BP 90/60 (BP Location: Left Arm, Patient Position: Sitting, Cuff Size: Normal)   Pulse 72   Ht 5\' 8"  (1.727 m) Comment: height measured without shoes  Wt 211 lb 4 oz (95.8 kg)   BMI 32.12 kg/m   Constitutional: generally well-appearing, no acute distress Psychiatric: alert and oriented x3, cooperative Eyes: Anicteric Abdomen: Not reexamined Extremities: no obvious abnormalities  Skin: no significant lesions on visible extremities Neuro: Grossly intact  ASSESSMENT:  1.  Chronic problems with bloody diarrhea.  This is a patient of immunosuppressants for prior heart transplant.  Excellent response to Xifaxan.  Did have slight relapse and was treated with a second course.  Currently asymptomatic.  Suspect bacterial overgrowth, may be IBS-D   PLAN:  1.  Discussion today on bacterial overgrowth and IBS-D 2.  Given a prescription  for Xifaxan 550 mg 3 times daily for 2 weeks which he may take on demand 3.  GI office follow-up 6 months.  Contact the office in the interim for any questions or problems 30 minutes was spent preparing to see the patient, obtaining interval history, performing medically appropriate physical examination, ordering medication, educating counseling the patient regarding the above listed issues, and documenting clinical information in  the health record

## 2022-06-03 NOTE — Patient Instructions (Signed)
If you are age 48 or older, your body mass index should be between 23-30. Your Body mass index is 32.12 kg/m. If this is out of the aforementioned range listed, please consider follow up with your Primary Care Provider.  If you are age 33 or younger, your body mass index should be between 19-25. Your Body mass index is 32.12 kg/m. If this is out of the aformentioned range listed, please consider follow up with your Primary Care Provider.   ________________________________________________________  The  GI providers would like to encourage you to use [facility] to communicate with providers for non-urgent requests or questions.  Due to long hold times on the telephone, sending your provider a message by [facility] may be a faster and more efficient way to get a response.  Please allow 48 business hours for a response.  Please remember that this is for non-urgent requests.  _______________________________________________________  We have sent the following medications to your pharmacy for you to pick up at your convenience:  Xifaxan  Please follow up 6 months

## 2022-06-06 ENCOUNTER — Encounter: Payer: Self-pay | Admitting: *Deleted

## 2022-06-25 ENCOUNTER — Telehealth: Payer: Self-pay | Admitting: Internal Medicine

## 2022-06-25 MED ORDER — GLYCOPYRROLATE 1 MG PO TABS
1.0000 mg | ORAL_TABLET | Freq: Two times a day (BID) | ORAL | 0 refills | Status: DC
Start: 2022-06-25 — End: 2022-08-11

## 2022-06-25 NOTE — Telephone Encounter (Signed)
Shawn Calderon pt calling, states he has taken 2 rounds of xifaxan for SIBO. He reports the symptoms stop and then about a week-week 1/2 they come back. This time he reports he started having diarrhea and he took some Imodium for  a few days and then he had terrible gas and bloating. Reports now his stomach is burning and he has no appetite and he doesn't know what to do. States he is taking a probiotic, align. As DOD Please advise.

## 2022-06-25 NOTE — Telephone Encounter (Signed)
Dr. Marina Goodell will need to review next week when he returns to the office for long term mgmt plans.  For the next few days: Glycopyrrolate 1 mg po bid, #60, no refills.  Gas-X qid prn

## 2022-06-25 NOTE — Telephone Encounter (Signed)
Citrucel 2 tablespoons daily in 14 oz of water or juice

## 2022-06-25 NOTE — Telephone Encounter (Signed)
Pt aware and prescription sent to pharmacy. Dr. Marina Goodell please review for long term management plans.

## 2022-06-29 NOTE — Telephone Encounter (Signed)
Pt notified via mychart

## 2022-07-26 ENCOUNTER — Other Ambulatory Visit: Payer: Self-pay

## 2022-07-26 ENCOUNTER — Other Ambulatory Visit (HOSPITAL_COMMUNITY): Payer: Self-pay | Admitting: Emergency Medicine

## 2022-07-26 ENCOUNTER — Ambulatory Visit (HOSPITAL_BASED_OUTPATIENT_CLINIC_OR_DEPARTMENT_OTHER)
Admission: RE | Admit: 2022-07-26 | Discharge: 2022-07-26 | Disposition: A | Discharge status: Home / Self Care | Payer: No Typology Code available for payment source | Source: Ambulatory Visit | Attending: Emergency Medicine | Admitting: Emergency Medicine

## 2022-07-26 ENCOUNTER — Emergency Department (HOSPITAL_BASED_OUTPATIENT_CLINIC_OR_DEPARTMENT_OTHER)
Admission: EM | Admit: 2022-07-26 | Discharge: 2022-07-26 | Disposition: A | Discharge status: Home / Self Care | Payer: No Typology Code available for payment source | Attending: Emergency Medicine | Admitting: Emergency Medicine

## 2022-07-26 ENCOUNTER — Inpatient Hospital Stay (HOSPITAL_BASED_OUTPATIENT_CLINIC_OR_DEPARTMENT_OTHER)
Admit: 2022-07-26 | Discharge: 2022-07-26 | Disposition: A | Discharge status: Home / Self Care | Payer: PRIVATE HEALTH INSURANCE | Attending: Emergency Medicine | Admitting: Emergency Medicine

## 2022-07-26 ENCOUNTER — Encounter (HOSPITAL_BASED_OUTPATIENT_CLINIC_OR_DEPARTMENT_OTHER): Payer: Self-pay | Admitting: Emergency Medicine

## 2022-07-26 DIAGNOSIS — Z7982 Long term (current) use of aspirin: Secondary | ICD-10-CM | POA: Diagnosis not present

## 2022-07-26 DIAGNOSIS — R52 Pain, unspecified: Secondary | ICD-10-CM | POA: Diagnosis not present

## 2022-07-26 DIAGNOSIS — I70208 Unspecified atherosclerosis of native arteries of extremities, other extremity: Secondary | ICD-10-CM | POA: Insufficient documentation

## 2022-07-26 DIAGNOSIS — I97630 Postprocedural hematoma of a circulatory system organ or structure following a cardiac catheterization: Secondary | ICD-10-CM

## 2022-07-26 DIAGNOSIS — M79631 Pain in right forearm: Secondary | ICD-10-CM | POA: Diagnosis present

## 2022-07-26 DIAGNOSIS — I509 Heart failure, unspecified: Secondary | ICD-10-CM | POA: Diagnosis not present

## 2022-07-26 MED ORDER — HYDROCODONE-ACETAMINOPHEN 5-325 MG PO TABS: 1.0000 | ORAL_TABLET | ORAL | 0 refills | Status: DC | PRN

## 2022-07-26 NOTE — ED Provider Notes (Signed)
[facility] Provider Note: Shawn Dell, MD, FACEP  CSN: [id number] MRN: [id number] ARRIVAL: 07/26/22 at 0246 ROOM: DB012/DB012   CHIEF COMPLAINT  Arm Pain   HISTORY OF PRESENT ILLNESS  07/26/22 3:05 AM Shawn Calderon is a 46 y.o. male who is status post heart transplant.  He underwent a cardiac catheterization at [facility] 9 days ago.  He has subsequently developed significant ecchymosis of the right forearm with pain in his right forearm not controlled by tramadol.  He rates the pain as a 6 out of 10, worse with movement or palpation.  He continues to have movement and sensation in his right hand and his right hand is pink and warm.   Past Medical History:  Diagnosis Date   CHF (congestive heart failure) (HCC)    Heart transplant recipient [facility]) 2008    Past Surgical History:  Procedure Laterality Date   APPENDECTOMY     COLONOSCOPY     heart transplant  2008   heart transplant recipient    Family History  Problem Relation Age of Onset   Diverticulosis Mother    Diverticulosis Father    Colon cancer Neg Hx    Pancreatic cancer Neg Hx    Esophageal cancer Neg Hx    Stomach cancer Neg Hx    Liver cancer Neg Hx    Rectal cancer Neg Hx     Social History   Tobacco Use   Smoking status: Never   Smokeless tobacco: Never  Substance Use Topics   Alcohol use: Not Currently   Drug use: Not Currently    Prior to Admission medications   Medication Sig Start Date End Date Taking? Authorizing Provider  HYDROcodone-acetaminophen (NORCO) 5-325 MG tablet Take 1-2 tablets by mouth every 4 (four) hours as needed for severe pain. 07/26/22  Yes Sharline Lehane, MD  aspirin EC 81 MG tablet Take 1 tablet by mouth daily.    [provider]  cetirizine (ZYRTEC ALLERGY) 10 MG tablet Take 1 tablet (10 mg total) by mouth daily. 03/01/22   Junie Spencer, FNP  citalopram (CELEXA) 20 MG tablet Take 30 mg by mouth at bedtime.  09/01/18 06/03/22  [provider]   clonazePAM (KLONOPIN) 0.5 MG tablet Take 1 mg by mouth 2 (two) times daily as needed for anxiety. 09/01/18   [provider]  glycopyrrolate (ROBINUL) 1 MG tablet Take 1 tablet (1 mg total) by mouth 2 (two) times daily. 06/25/22   Meryl Dare, MD  Multiple Vitamin (MULTIVITAMIN WITH MINERALS) TABS tablet Take 1 tablet by mouth daily.    [provider]  mycophenolate (CELLCEPT) 500 MG tablet Take 500 mg by mouth. 3 tablets twice a day 09/05/21   [provider]  omeprazole (PRILOSEC) 40 MG capsule Take 1 capsule (40 mg total) by mouth daily. 04/23/22   Hilarie Fredrickson, MD  promethazine-dextromethorphan (PROMETHAZINE-DM) 6.25-15 MG/5ML syrup Take 5 mLs by mouth 4 (four) times daily as needed for cough. 04/07/22   Daphine Deutscher, Mary-Margaret, FNP  rifaximin (XIFAXAN) 550 MG TABS tablet Take 1 tablet (550 mg total) by mouth 3 (three) times daily. 06/03/22   Hilarie Fredrickson, MD  rosuvastatin (CRESTOR) 20 MG tablet Take 20 mg by mouth at bedtime. 01/19/20   [provider]  tacrolimus (PROGRAF) 1 MG capsule Take 2 mg by mouth 2 (two) times daily.  09/01/18   [provider]    Allergies Sulfamethoxazole, Amoxicillin-pot clavulanate, and Clindamycin   REVIEW OF SYSTEMS  Negative  except as noted here or in the History of Present Illness.   PHYSICAL EXAMINATION  Initial Vital Signs Blood pressure (!) 168/103, pulse (!) 52, temperature 98.1 F (36.7 C), temperature source Oral, resp. rate 18, SpO2 94 %.  Examination General: Well-developed, well-nourished male in no acute distress; appearance consistent with age of record HENT: normocephalic; atraumatic Eyes: Normal appearance Neck: supple Heart: regular rate and rhythm Lungs: clear to auscultation bilaterally Abdomen: soft; nondistended; nontender; bowel sounds present Extremities: No deformity; ecchymosis and tenderness of right forearm with ulnar pulse +2, radial pulse faint, right hand neurovascularly  intact with brisk capillary refill throughout:    Neurologic: Awake, alert and oriented; motor function intact in all extremities and symmetric; no facial droop Skin: Warm and dry Psychiatric: Normal mood and affect   RESULTS  Summary of this visit's results, reviewed and interpreted by myself:   EKG Interpretation  Date/Time:    Ventricular Rate:    PR Interval:    QRS Duration:   QT Interval:    QTC Calculation:   R Axis:     Text Interpretation:         Laboratory Studies: No results found for this or any previous visit (from the past 24 hour(s)). Imaging Studies: No results found.  ED COURSE and MDM  Nursing notes, initial and subsequent vitals signs, including pulse oximetry, reviewed and interpreted by myself.  Vitals:   07/26/22 0259 07/26/22 0301  BP:  (!) 168/103  Pulse:  (!) 52  Resp:  18  Temp: 98.1 F (36.7 C)   TempSrc: Oral   SpO2:  94%   Medications - No data to display  The patient's right hand does not appear ischemic as it appears to be getting adequate blood flow from the ulnar artery.  I do not feel a significant pulse on the radial artery and I am concerned he has an occlusion.  We will have him return later this morning for an arterial Doppler to evaluate blood flow.  In the meantime we will treat his pain with a better analgesic.  PROCEDURES  Procedures   ED DIAGNOSES     ICD-10-CM   1. 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

## 2022-07-26 NOTE — ED Triage Notes (Signed)
Pain at cath site.  Right wrist Cath Friday

## 2022-07-26 NOTE — ED Notes (Signed)
Pt agreeable with d/c plan as discussed by provider - this nurse has verbally reinforced d/c instructions and provided pt with written copy- pt acknowledges verbal understanding and denies any additional questions, concerns, needs- pt ambulatory independently at d/c with steady gait; no distress.

## 2022-07-26 NOTE — ED Notes (Signed)
Arterial US not able to be performed at [facility]. [facility] was contacted and they are able to do the outpatient exam at noon today. Patient was contacted and he is ok with noon. Patient was informed to go to ED entrance and check in there for an outpatient ultrasound. Confirmed appt with Vascular Lab.

## 2022-07-26 NOTE — Progress Notes (Signed)
VASCULAR LAB    Right upper extremity arterial duplex has been performed.  See CV proc for preliminary results.   Shawn Calderon, RVT 07/26/2022, 1:04 PM

## 2022-08-11 ENCOUNTER — Other Ambulatory Visit: Payer: Self-pay | Admitting: Gastroenterology

## 2022-11-28 ENCOUNTER — Other Ambulatory Visit: Payer: Self-pay

## 2022-11-28 ENCOUNTER — Emergency Department (HOSPITAL_BASED_OUTPATIENT_CLINIC_OR_DEPARTMENT_OTHER)
Admission: EM | Admit: 2022-11-28 | Discharge: 2022-11-28 | Disposition: A | Discharge status: Home / Self Care | Payer: BLUE CROSS/BLUE SHIELD | Attending: Emergency Medicine | Admitting: Emergency Medicine

## 2022-11-28 ENCOUNTER — Encounter (HOSPITAL_BASED_OUTPATIENT_CLINIC_OR_DEPARTMENT_OTHER): Payer: Self-pay

## 2022-11-28 ENCOUNTER — Emergency Department (HOSPITAL_BASED_OUTPATIENT_CLINIC_OR_DEPARTMENT_OTHER): Payer: BLUE CROSS/BLUE SHIELD | Admitting: Radiology

## 2022-11-28 ENCOUNTER — Emergency Department (HOSPITAL_BASED_OUTPATIENT_CLINIC_OR_DEPARTMENT_OTHER): Payer: BLUE CROSS/BLUE SHIELD

## 2022-11-28 DIAGNOSIS — I509 Heart failure, unspecified: Secondary | ICD-10-CM | POA: Diagnosis not present

## 2022-11-28 DIAGNOSIS — Z7982 Long term (current) use of aspirin: Secondary | ICD-10-CM | POA: Insufficient documentation

## 2022-11-28 DIAGNOSIS — Z1152 Encounter for screening for COVID-19: Secondary | ICD-10-CM | POA: Insufficient documentation

## 2022-11-28 DIAGNOSIS — R059 Cough, unspecified: Secondary | ICD-10-CM | POA: Diagnosis present

## 2022-11-28 DIAGNOSIS — J180 Bronchopneumonia, unspecified organism: Secondary | ICD-10-CM | POA: Insufficient documentation

## 2022-11-28 LAB — BASIC METABOLIC PANEL
Anion gap: 12 (ref 5–15)
BUN: 18 mg/dL (ref 6–20)
CO2: 19 mmol/L — ABNORMAL LOW (ref 22–32)
Calcium: 9.6 mg/dL (ref 8.9–10.3)
Chloride: 105 mmol/L (ref 98–111)
Creatinine, Ser: 1.34 mg/dL — ABNORMAL HIGH (ref 0.61–1.24)
GFR, Estimated: >60 mL/min (ref >60)
Glucose, Bld: 111 mg/dL — ABNORMAL HIGH (ref 70–99)
Potassium: 3.7 mmol/L (ref 3.5–5.1)
Sodium: 136 mmol/L (ref 135–145)

## 2022-11-28 LAB — CBC WITH DIFFERENTIAL/PLATELET
Abs Immature Granulocytes: 0.02 10*3/uL (ref 0.00–0.07)
Basophils Absolute: 0.1 10*3/uL (ref 0.0–0.1)
Basophils Relative: 1 %
Eosinophils Absolute: 0.5 10*3/uL (ref 0.0–0.5)
Eosinophils Relative: 5 %
HCT: 39.7 % (ref 39.0–52.0)
Hemoglobin: 13.4 g/dL (ref 13.0–17.0)
Immature Granulocytes: 0 %
Lymphocytes Relative: 24 %
Lymphs Abs: 2.1 10*3/uL (ref 0.7–4.0)
MCH: 28.4 pg (ref 26.0–34.0)
MCHC: 33.8 g/dL (ref 30.0–36.0)
MCV: 84.1 fL (ref 80.0–100.0)
Monocytes Absolute: 0.7 10*3/uL (ref 0.1–1.0)
Monocytes Relative: 7 %
Neutro Abs: 5.7 10*3/uL (ref 1.7–7.7)
Neutrophils Relative %: 63 %
Platelets: 255 10*3/uL (ref 150–400)
RBC: 4.72 MIL/uL (ref 4.22–5.81)
RDW: 12.2 % (ref 11.5–15.5)
WBC: 9.1 10*3/uL (ref 4.0–10.5)
nRBC: 0 % (ref 0.0–0.2)

## 2022-11-28 LAB — RESP PANEL BY RT-PCR (RSV, FLU A&B, COVID)  RVPGX2
Influenza A by PCR: NEGATIVE
Influenza B by PCR: NEGATIVE
Resp Syncytial Virus by PCR: NEGATIVE
SARS Coronavirus 2 by RT PCR: NEGATIVE

## 2022-11-28 MED ORDER — CEPHALEXIN 250 MG PO CAPS: 500.0000 mg | ORAL_CAPSULE | Freq: Once | ORAL | Status: DC

## 2022-11-28 MED ORDER — BENZONATATE 100 MG PO CAPS: 100.0000 mg | ORAL_CAPSULE | Freq: Three times a day (TID) | ORAL | 0 refills | Status: DC

## 2022-11-28 MED ORDER — CEFDINIR 300 MG PO CAPS
300.0000 mg | ORAL_CAPSULE | Freq: Once | ORAL | Status: AC
  Administered 2022-11-28: 300 mg via ORAL
  Filled 2022-11-28: qty 1

## 2022-11-28 MED ORDER — CEFDINIR 300 MG PO CAPS: 300.0000 mg | ORAL_CAPSULE | Freq: Two times a day (BID) | ORAL | 0 refills | Status: DC

## 2022-11-28 MED ORDER — AZITHROMYCIN 250 MG PO TABS: 250.0000 mg | ORAL_TABLET | Freq: Every day | ORAL | 0 refills | Status: DC

## 2022-11-28 NOTE — ED Provider Notes (Signed)
[facility] EMERGENCY DEPT Provider Note   CSN: [id number] Arrival date & time: 11/28/22  0014     History  Chief Complaint  Patient presents with   Cough    Shawn Calderon is a 46 y.o. male.  HPI     This is a 46 year old male who presents with cough.  Patient reports acute on chronic cough.  He states that he has not been able to shake his cough for over 3 months.  Cough is largely nonproductive.  He states he was recently in [place] and had significant increase in worsening of cough.  No fevers.  At 1 point over the last 3 months, multiple family members have been sick with viral illness.  Patient also states that he thought it may be postnasal drip.  He has a significant history of a heart transplant; however, states that he is on stable meds for this.  Has not noted any lower extremity swelling or orthopnea.  Home Medications Prior to Admission medications   Medication Sig Start Date End Date Taking? Authorizing Provider  azithromycin (ZITHROMAX) 250 MG tablet Take 1 tablet (250 mg total) by mouth daily. Take first 2 tablets together, then 1 every day until finished. 11/28/22  Yes Jasmon Mattice, Mayer Masker, MD  benzonatate (TESSALON) 100 MG capsule Take 1 capsule (100 mg total) by mouth every 8 (eight) hours. 11/28/22  Yes Wataru Mccowen, Mayer Masker, MD  cefdinir (OMNICEF) 300 MG capsule Take 1 capsule (300 mg total) by mouth 2 (two) times daily. 11/28/22  Yes Miking Usrey, Mayer Masker, MD  aspirin EC 81 MG tablet Take 1 tablet by mouth daily.    [provider]  cetirizine (ZYRTEC ALLERGY) 10 MG tablet Take 1 tablet (10 mg total) by mouth daily. 03/01/22   Junie Spencer, FNP  citalopram (CELEXA) 20 MG tablet Take 30 mg by mouth at bedtime.  09/01/18 06/03/22  [provider]  clonazePAM (KLONOPIN) 0.5 MG tablet Take 1 mg by mouth 2 (two) times daily as needed for anxiety. 09/01/18   [provider]  glycopyrrolate (ROBINUL) 1 MG tablet TAKE 1 TABLET BY MOUTH  TWICE A DAY 08/11/22   Hilarie Fredrickson, MD  HYDROcodone-acetaminophen (NORCO) 5-325 MG tablet Take 1-2 tablets by mouth every 4 (four) hours as needed for severe pain. 07/26/22   Molpus, John, MD  Multiple Vitamin (MULTIVITAMIN WITH MINERALS) TABS tablet Take 1 tablet by mouth daily.    [provider]  mycophenolate (CELLCEPT) 500 MG tablet Take 500 mg by mouth. 3 tablets twice a day 09/05/21   [provider]  omeprazole (PRILOSEC) 40 MG capsule Take 1 capsule (40 mg total) by mouth daily. 04/23/22   Hilarie Fredrickson, MD  promethazine-dextromethorphan (PROMETHAZINE-DM) 6.25-15 MG/5ML syrup Take 5 mLs by mouth 4 (four) times daily as needed for cough. 04/07/22   Daphine Deutscher, Mary-Margaret, FNP  rifaximin (XIFAXAN) 550 MG TABS tablet Take 1 tablet (550 mg total) by mouth 3 (three) times daily. 06/03/22   Hilarie Fredrickson, MD  rosuvastatin (CRESTOR) 20 MG tablet Take 20 mg by mouth at bedtime. 01/19/20   [provider]  tacrolimus (PROGRAF) 1 MG capsule Take 2 mg by mouth 2 (two) times daily.  09/01/18   [provider]      Allergies    Sulfamethoxazole, Amoxicillin-pot clavulanate, Clindamycin, Compazine [prochlorperazine], and Reglan [metoclopramide]    Review of Systems   Review of Systems  Constitutional:  Negative for fever.  Respiratory:  Positive for cough and shortness  of breath.   Cardiovascular:  Negative for chest pain.  All other systems reviewed and are negative.   Physical Exam Updated Vital Signs BP 128/87   Pulse 91   Temp 99.6 F (37.6 C) (Oral)   Resp 18   Ht 1.778 m (5\' 10" )   Wt 86.2 kg   SpO2 97%   BMI 27.26 kg/m  Physical Exam Vitals and nursing note reviewed.  Constitutional:      Appearance: He is well-developed. He is not ill-appearing.  HENT:     Head: Normocephalic and atraumatic.  Eyes:     Pupils: Pupils are equal, round, and reactive to light.  Cardiovascular:     Rate and Rhythm: Normal rate and regular rhythm.     Heart  sounds: Normal heart sounds. No murmur heard. Pulmonary:     Effort: Pulmonary effort is normal. No respiratory distress.     Breath sounds: Normal breath sounds. No wheezing.  Abdominal:     General: Bowel sounds are normal.     Palpations: Abdomen is soft.     Tenderness: There is no abdominal tenderness. There is no rebound.  Musculoskeletal:     Cervical back: Neck supple.     Right lower leg: No edema.     Left lower leg: No edema.  Lymphadenopathy:     Cervical: No cervical adenopathy.  Skin:    General: Skin is warm and dry.  Neurological:     Mental Status: He is alert and oriented to person, place, and time.  Psychiatric:        Mood and Affect: Mood normal.     ED Results / Procedures / Treatments   Labs (all labs ordered are listed, but only abnormal results are displayed) Labs Reviewed  BASIC METABOLIC PANEL - Abnormal; Notable for the following components:      Result Value   CO2 19 (*)    Glucose, Bld 111 (*)    Creatinine, Ser 1.34 (*)    All other components within normal limits  RESP PANEL BY RT-PCR (RSV, FLU A&B, COVID)  RVPGX2  CBC WITH DIFFERENTIAL/PLATELET    EKG None  Radiology CT Chest Wo Contrast  Result Date: 11/28/2022 CLINICAL DATA:  46 year old male with history of cough. EXAM: CT CHEST WITHOUT CONTRAST TECHNIQUE: Multidetector CT imaging of the chest was performed following the standard protocol without IV contrast. RADIATION DOSE REDUCTION: This exam was performed according to the departmental dose-optimization program which includes automated exposure control, adjustment of the mA and/or kV according to patient size and/or use of iterative reconstruction technique. COMPARISON:  Chest CTA 02/10/2007. FINDINGS: Cardiovascular: Heart size is mildly enlarged. There is no significant pericardial fluid, thickening or pericardial calcification. There is aortic atherosclerosis, as well as atherosclerosis of the great vessels of the mediastinum and  the coronary arteries, including calcified atherosclerotic plaque in the left main, left anterior descending, left circumflex and right coronary arteries. Mediastinum/Nodes: No pathologically enlarged mediastinal or hilar lymph nodes. Esophagus is unremarkable in appearance. No axillary lymphadenopathy. Lungs/Pleura: Severe diffuse bronchial wall thickening with patchy areas of thickening of the peribronchovascular interstitium with associated peribronchovascular ground-glass attenuation and developing consolidative changes scattered throughout the lungs bilaterally, most severe in the right upper and lower lobes. Linear scarring or atelectasis in the lingula and right middle lobe. Upper Abdomen: A few scattered subcentimeter low-attenuation lesions in the liver, too small to characterize, but statistically likely to represent tiny cysts (no imaging follow-up recommended). Musculoskeletal: Median sternotomy wires. There are  no aggressive appearing lytic or blastic lesions noted in the visualized portions of the skeleton. IMPRESSION: 1. The appearance of the chest is most suggestive of severe bronchitis with developing multilobar bilateral bronchopneumonia, as detailed above. 2. Aortic atherosclerosis, in addition to left main and three-vessel coronary artery disease. Please note that although the presence of coronary artery calcium documents the presence of coronary artery disease, the severity of this disease and any potential stenosis cannot be assessed on this non-gated CT examination. Assessment for potential risk factor modification, dietary therapy or pharmacologic therapy may be warranted, if clinically indicated. 3. Mild cardiomegaly. Aortic Atherosclerosis (ICD10-I70.0). Electronically Signed   By: Trudie Reed M.D.   On: 11/28/2022 06:13   DG Chest 2 View  Result Date: 11/28/2022 CLINICAL DATA:  Shortness of breath EXAM: CHEST - 2 VIEW COMPARISON:  Chest x-ray 03/21/2022 FINDINGS: The heart is  mildly enlarged. There central pulmonary vascular congestion. There is some patchy opacities in the left mid and lower lung. The costophrenic angles are clear. There is no pneumothorax or acute fracture. Sternotomy wires are present. IMPRESSION: 1. Mild cardiomegaly with central pulmonary vascular congestion. 2. Patchy opacities in the left mid and lower lung may reflect infection or edema. Electronically Signed   By: Darliss Cheney M.D.   On: 11/28/2022 01:37    Procedures Procedures    Medications Ordered in ED Medications  cefdinir (OMNICEF) capsule 300 mg (300 mg Oral Given 11/28/22 1610)    ED Course/ Medical Decision Making/ A&P                           Medical Decision Making Amount and/or Complexity of Data Reviewed Labs: ordered. Radiology: ordered.  Risk Prescription drug management.   This patient presents to the ED for concern of cough, this involves an extensive number of treatment options, and is a complaint that carries with it a high risk of complications and morbidity.  I considered the following differential and admission for this acute, potentially life threatening condition.  The differential diagnosis includes chronic bronchitis, pneumonia, viral illness such as COVID or influenza, GERD  MDM:    This 46 year old male with significant history of heart transplant who presents with cough.  Is acute on chronic.  States that it has acutely worsened.  It is nonproductive.  He is otherwise nontoxic-appearing.  Vital signs are reassuring.  He is in no respiratory distress and temperature is 99.6.  COVID and influenza testing are negative.  Chest x-ray indicates either edema or interstitial opacities.  Patient does have a history of CHF secondary to his heart transplant but does not appear overtly volume overloaded.  Will obtain a CT scan to better differentiate.  CT scan indicates potential bronchopneumonia.  Given acute worsening of cough, would like to treat.  Will discharge  with Omnicef and azithromycin.  If not improved, he needs to follow-up closely with his primary physician.  (Labs, imaging, consults)  Labs: I Ordered, and personally interpreted labs.  The pertinent results include: BC, BMP, COVID, influenza  Imaging Studies ordered: I ordered imaging studies including chest x-ray, CT chest I independently visualized and interpreted imaging. I agree with the radiologist interpretation  Additional history obtained from chart review.  External records from outside source obtained and reviewed including transplant notes  Cardiac Monitoring: The patient was maintained on a cardiac monitor.  I personally viewed and interpreted the cardiac monitored which showed an underlying rhythm of: Sinus rhythm  Reevaluation:  After the interventions noted above, I reevaluated the patient and found that they have :stayed the same  Social Determinants of Health:  Lives independently  Disposition: Discharge  Co morbidities that complicate the patient evaluation  Past Medical History:  Diagnosis Date   CHF (congestive heart failure) (HCC)    Heart transplant recipient [facility](HCC) 2008     Medicines Meds ordered this encounter  Medications   DISCONTD: cephALEXin (KEFLEX) capsule 500 mg   cefdinir (OMNICEF) capsule 300 mg   cefdinir (OMNICEF) 300 MG capsule    Sig: Take 1 capsule (300 mg total) by mouth 2 (two) times daily.    Dispense:  20 capsule    Refill:  0   azithromycin (ZITHROMAX) 250 MG tablet    Sig: Take 1 tablet (250 mg total) by mouth daily. Take first 2 tablets together, then 1 every day until finished.    Dispense:  6 tablet    Refill:  0   benzonatate (TESSALON) 100 MG capsule    Sig: Take 1 capsule (100 mg total) by mouth every 8 (eight) hours.    Dispense:  21 capsule    Refill:  0    I have reviewed the patients home medicines and have made adjustments as needed  Problem List / ED Course: Problem List Items Addressed This Visit    None Visit Diagnoses     Bronchopneumonia    -  Primary   Relevant Medications   cefdinir (OMNICEF) capsule 300 mg (Completed)   cefdinir (OMNICEF) 300 MG capsule   azithromycin (ZITHROMAX) 250 MG tablet   benzonatate (TESSALON) 100 MG capsule                   Final Clinical Impression(s) / ED Diagnoses Final diagnoses:  Bronchopneumonia    Rx / DC Orders ED Discharge Orders          Ordered    cefdinir (OMNICEF) 300 MG capsule  2 times daily        11/28/22 0637    azithromycin (ZITHROMAX) 250 MG tablet  Daily        11/28/22 0637    benzonatate (TESSALON) 100 MG capsule  Every 8 hours        11/28/22 0653              Shon BatonHorton, Delphin Funes F, MD 11/28/22 2257

## 2022-11-28 NOTE — ED Triage Notes (Signed)
Pt states that he has had cough, congestion, chills and headache intermittently x 3 months but seems to have worsened recently.

## 2022-11-28 NOTE — Discharge Instructions (Signed)
You were seen today.  Your CT indicates that you have likely bronchopulmonary pneumonia.  Your viral testing is negative.  You will be treated with antibiotics.  If not improved after taking antibiotics, follow-up closely with your primary doctor.

## 2023-02-23 ENCOUNTER — Telehealth: Payer: Self-pay | Admitting: Internal Medicine

## 2023-02-23 DIAGNOSIS — R112 Nausea with vomiting, unspecified: Secondary | ICD-10-CM

## 2023-02-23 DIAGNOSIS — R1084 Generalized abdominal pain: Secondary | ICD-10-CM

## 2023-02-23 DIAGNOSIS — R197 Diarrhea, unspecified: Secondary | ICD-10-CM

## 2023-02-23 MED ORDER — ONDANSETRON HCL 4 MG PO TABS: 4.0000 mg | ORAL_TABLET | Freq: Four times a day (QID) | ORAL | 1 refills | Status: DC | PRN

## 2023-02-23 MED ORDER — DICYCLOMINE HCL 10 MG PO CAPS: 10.0000 mg | ORAL_CAPSULE | Freq: Four times a day (QID) | ORAL | 1 refills | Status: DC | PRN

## 2023-02-23 NOTE — Telephone Encounter (Signed)
Received a page to the on-call.  Patient reports having burning quality abdominal pain, nausea, and nonbloody diarrhea for the last several days.  Reports having what he thought was infectious gastroenteritis about 2 weeks ago.  Symptoms had improved, but restarted to recur over the last couple of days and worsening today.  Decreased appetite, but has been trying to stick with a bland diet and hydrating.  No improvement with trial of antacids.  No fever.  Plan for the following:  - Sent in Rx for Zofran 4 mg as needed every 6 hours, #30, RF 1 - Sent in Rx for Bentyl 10 mg as needed every 6 hours, #30, RF 1 - In the a.m., please send lab order for stool culture, CBC, BMP - Confirmed [facility] [address] with patient  Patient was appreciated for phone call and will route to his primary GI team for review in the morning with him.

## 2023-02-23 NOTE — Telephone Encounter (Signed)
Inbound call from patient stating that he has been experiencing a " burning" feeling in his stomach and has not been able to eat. Patient is requesting a call back to discuss if he can get a prescription for the pain and to see if he can be seen any sooner than April with a PA. Please advise.

## 2023-02-23 NOTE — Telephone Encounter (Signed)
Patient returned call

## 2023-02-23 NOTE — Telephone Encounter (Signed)
Left message on machine to call back  

## 2023-02-24 ENCOUNTER — Encounter: Payer: Self-pay | Admitting: Internal Medicine

## 2023-02-24 ENCOUNTER — Other Ambulatory Visit (INDEPENDENT_AMBULATORY_CARE_PROVIDER_SITE_OTHER): Payer: BLUE CROSS/BLUE SHIELD

## 2023-02-24 DIAGNOSIS — R197 Diarrhea, unspecified: Secondary | ICD-10-CM

## 2023-02-24 DIAGNOSIS — R112 Nausea with vomiting, unspecified: Secondary | ICD-10-CM

## 2023-02-24 DIAGNOSIS — R1084 Generalized abdominal pain: Secondary | ICD-10-CM

## 2023-02-24 LAB — BASIC METABOLIC PANEL
BUN: 23 mg/dL (ref 6–23)
CO2: 22 mEq/L (ref 19–32)
Calcium: 9.4 mg/dL (ref 8.4–10.5)
Chloride: 104 mEq/L (ref 96–112)
Creatinine, Ser: 1.88 mg/dL — ABNORMAL HIGH (ref 0.40–1.50)
GFR: 41.75 mL/min — ABNORMAL LOW (ref >60.00)
Glucose, Bld: 92 mg/dL (ref 70–99)
Potassium: 4.1 mEq/L (ref 3.5–5.1)
Sodium: 136 mEq/L (ref 135–145)

## 2023-02-24 LAB — CBC WITH DIFFERENTIAL/PLATELET
Basophils Absolute: 0 10*3/uL (ref 0.0–0.1)
Basophils Relative: 0.4 % (ref 0.0–3.0)
Eosinophils Absolute: 0.1 10*3/uL (ref 0.0–0.7)
Eosinophils Relative: 1.1 % (ref 0.0–5.0)
HCT: 39.5 % (ref 39.0–52.0)
Hemoglobin: 13.1 g/dL (ref 13.0–17.0)
Lymphocytes Relative: 26.9 % (ref 12.0–46.0)
Lymphs Abs: 1.3 10*3/uL (ref 0.7–4.0)
MCHC: 33.3 g/dL (ref 30.0–36.0)
MCV: 84.1 fl (ref 78.0–100.0)
Monocytes Absolute: 0.6 10*3/uL (ref 0.1–1.0)
Monocytes Relative: 12.9 % — ABNORMAL HIGH (ref 3.0–12.0)
Neutro Abs: 2.9 10*3/uL (ref 1.4–7.7)
Neutrophils Relative %: 58.7 % (ref 43.0–77.0)
Platelets: 192 10*3/uL (ref 150.0–400.0)
RBC: 4.69 Mil/uL (ref 4.22–5.81)
RDW: 14.4 % (ref 11.5–15.5)
WBC: 5 10*3/uL (ref 4.0–10.5)

## 2023-02-24 NOTE — Telephone Encounter (Signed)
Pt called, stating he was returning Patty's call.

## 2023-02-24 NOTE — Telephone Encounter (Signed)
I spoke with the patient this am.  He will come for lab work this am when he has the opportunity.  He verbalized understanding to come directly to the lab for the blood work and that he will need to also do a stool study.  He reports that the diarrhea has resolved this am and will bring back the stool studies as he can.

## 2023-02-24 NOTE — Telephone Encounter (Signed)
The pt states he did have his lab work today and will await a call with results.

## 2023-02-24 NOTE — Addendum Note (Signed)
Addended by: Marlon Pel on: 02/24/2023 09:03 AM   Modules accepted: Orders

## 2023-02-25 NOTE — Telephone Encounter (Signed)
Line rings then goes busy will attempt later

## 2023-02-26 ENCOUNTER — Other Ambulatory Visit: Payer: Self-pay

## 2023-02-26 DIAGNOSIS — R1084 Generalized abdominal pain: Secondary | ICD-10-CM

## 2023-02-26 DIAGNOSIS — R197 Diarrhea, unspecified: Secondary | ICD-10-CM

## 2023-02-26 DIAGNOSIS — R112 Nausea with vomiting, unspecified: Secondary | ICD-10-CM

## 2023-03-01 ENCOUNTER — Telehealth: Payer: Self-pay | Admitting: Internal Medicine

## 2023-03-01 NOTE — Telephone Encounter (Signed)
I have advised that pt that his stool studies have not resulted as of this afternoon.  We will call him as soon as able.

## 2023-03-01 NOTE — Telephone Encounter (Signed)
Inbound call from patient requesting a call back to discuss if his stool sample results have come back yet. Please advise.

## 2023-03-02 LAB — STOOL CULTURE: E coli, Shiga toxin Assay: NEGATIVE

## 2023-03-23 ENCOUNTER — Ambulatory Visit: Payer: Self-pay | Admitting: Internal Medicine

## 2023-03-25 ENCOUNTER — Encounter: Payer: Self-pay | Admitting: Internal Medicine

## 2023-03-25 ENCOUNTER — Ambulatory Visit: Payer: Managed Care, Other (non HMO) | Admitting: Internal Medicine

## 2023-03-25 VITALS — BP 90/64 | HR 80 | Ht 68.0 in | Wt 189.1 lb

## 2023-03-25 DIAGNOSIS — R197 Diarrhea, unspecified: Secondary | ICD-10-CM | POA: Diagnosis not present

## 2023-03-25 DIAGNOSIS — R112 Nausea with vomiting, unspecified: Secondary | ICD-10-CM | POA: Diagnosis not present

## 2023-03-25 NOTE — Patient Instructions (Addendum)
Please follow up as needed.  _______________________________________________________  If your blood pressure at your visit was 140/90 or greater, please contact your primary care physician to follow up on this.  _______________________________________________________  If you are age 49 or older, your body mass index should be between 23-30. Your Body mass index is 28.76 kg/m. If this is out of the aforementioned range listed, please consider follow up with your Primary Care Provider.  If you are age 94 or younger, your body mass index should be between 19-25. Your Body mass index is 28.76 kg/m. If this is out of the aformentioned range listed, please consider follow up with your Primary Care Provider.   ________________________________________________________  The [facility] providers would like to encourage you to use [facility] to communicate with providers for non-urgent requests or questions.  Due to long hold times on the telephone, sending your provider a message by [facility] may be a faster and more efficient way to get a response.  Please allow 48 business hours for a response.  Please remember that this is for non-urgent requests.  _______________________________________________________ It was a pleasure to see you today!  Thank you for trusting me with your gastrointestinal care!

## 2023-03-25 NOTE — Progress Notes (Signed)
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  He was last evaluated in this office June 03, 2022 regarding chronic problems with nonbloody diarrhea.  He underwent extensive workup including upper and lower endoscopy with biopsies May 2023.  Felt to have irritable bowel.  Did respond to Xifaxan raising question of SIBO.  Told to follow-up in 6 months.  Sooner if needed.  The patient to contact the office 1 month ago with severe watery diarrhea associated with abdominal cramping and occasional vomiting.  He was told to come in for laboratory work and stool studies.  He was prescribed dicyclomine and fiber.  CBC and basic metabolic panel were normal.  Stool culture for Salmonella, Shigella, Campylobacter, E. coli, and C. difficile were all negative.  Tells me that he continue with diarrhea until about 10 days ago.  Now back to his baseline.  He thinks fiber is helping.  Not really needing dicyclomine.  Has questions regarding his workup and possible etiology of his illness.  REVIEW OF SYSTEMS:  All non-GI ROS negative unless otherwise stated in the HPI except for weight loss (associated with his diarrhea).  Past Medical History:  Diagnosis Date   CHF (congestive heart failure)    Heart transplant recipient 2008    Past Surgical History:  Procedure Laterality Date   APPENDECTOMY     COLONOSCOPY     heart transplant  2008   heart transplant recipient    Social History Shawn Calderon  reports that he has never smoked. He has never used smokeless tobacco. He reports that he does not currently use alcohol. He reports that he does not currently use drugs.  family history includes Diverticulosis in his father and mother.  Allergies  Allergen Reactions   Sulfamethoxazole Hives and Rash   Amoxicillin-Pot Clavulanate Diarrhea   Clindamycin Other (See  Comments)    Heartburn   Compazine [Prochlorperazine]     Anxiety    Reglan [Metoclopramide]    Sirolimus Other (See Comments)    Other Reaction(s): Other (See Comments)  GI distress       PHYSICAL EXAMINATION: Vital signs: BP 90/64 (BP Location: Left Arm, Patient Position: Sitting, Cuff Size: Normal)   Pulse 80   Ht 5\' 8"  (1.727 m)   Wt 189 lb 2 oz (85.8 kg)   BMI 28.76 kg/m   Constitutional: generally well-appearing, no acute distress Psychiatric: alert and oriented x3, cooperative Eyes: extraocular movements intact, anicteric, conjunctiva pink Mouth: oral pharynx moist, no lesions Neck: supple no lymphadenopathy Cardiovascular: heart regular rate and rhythm, no murmur Lungs: clear to auscultation bilaterally Abdomen: soft, nontender, nondistended, no obvious ascites, no peritoneal signs, normal bowel sounds, no organomegaly Rectal: Omitted Extremities: no clubbing, cyanosis, or lower extremity edema bilaterally Skin: no lesions on visible extremities Neuro: No focal deficits.  Cranial nerves intact  ASSESSMENT:  1.  Recent acute diarrheal illness as described.  Most consistent with viral such as norovirus.  Resolved. 2.  History of chronic diarrhea.  IBS versus SIBO.  Had responded to Xifaxan 3.  Recent colonoscopy and upper endoscopy (May 2023) unremarkable. 4.  Status post heart transplant on immunosuppressants   PLAN:  1.  Discussion on viral gastroenteritis 2.  Reassurance 3.  Continue fiber 4.  Routine screening colonoscopy 2033 5.  Routine GI follow-up 1 year.  Sooner if needed. A total time of 30  minutes was spent preparing to see the patient, reviewing myriad of data, obtaining interval history, performing comprehensive physical exam, counseling and educating the patient regarding above listed issues, and documenting clinical information in the health record

## 2023-04-03 ENCOUNTER — Telehealth: Payer: Managed Care, Other (non HMO) | Admitting: Nurse Practitioner

## 2023-04-03 DIAGNOSIS — H6993 Unspecified Eustachian tube disorder, bilateral: Secondary | ICD-10-CM

## 2023-04-03 MED ORDER — CEFDINIR 300 MG PO CAPS: 300.0000 mg | ORAL_CAPSULE | Freq: Two times a day (BID) | ORAL | 0 refills | Status: AC

## 2023-04-03 MED ORDER — FLUTICASONE PROPIONATE 50 MCG/ACT NA SUSP: 1.0000 | Freq: Every day | NASAL | 0 refills | Status: DC

## 2023-04-03 MED ORDER — PREDNISONE 10 MG PO TABS: ORAL_TABLET | ORAL | 0 refills | Status: DC

## 2023-04-03 NOTE — Progress Notes (Signed)
Virtual Visit Consent   Shawn Calderon, you are scheduled for a virtual visit with a [facility] provider today. Just as with appointments in the office, your consent must be obtained to participate. Your consent will be active for this visit and any virtual visit you may have with one of our providers in the next 365 days. If you have a MyChart account, a copy of this consent can be sent to you electronically.  As this is a virtual visit, video technology does not allow for your provider to perform a traditional examination. This may limit your provider's ability to fully assess your condition. If your provider identifies any concerns that need to be evaluated in person or the need to arrange testing (such as labs, EKG, etc.), we will make arrangements to do so. Although advances in technology are sophisticated, we cannot ensure that it will always work on either your end or our end. If the connection with a video visit is poor, the visit may have to be switched to a telephone visit. With either a video or telephone visit, we are not always able to ensure that we have a secure connection.  By engaging in this virtual visit, you consent to the provision of healthcare and authorize for your insurance to be billed (if applicable) for the services provided during this visit. Depending on your insurance coverage, you may receive a charge related to this service.  I need to obtain your verbal consent now. Are you willing to proceed with your visit today? ARMIN YERGER has provided verbal consent on 04/03/2023 for a virtual visit (video or telephone). [name], NP  Date: 04/03/2023 2:10 PM  Virtual Visit via Video Note   I, [name], connected with  Shawn Calderon  ([id number], 1976-09-15) on 04/03/23 at  2:00 PM EDT by a video-enabled telemedicine application and verified that I am speaking with the correct person using two identifiers.  Location: Patient: Virtual Visit Location Patient:  Home Provider: Virtual Visit Location Provider: Home Office   I discussed the limitations of evaluation and management by telemedicine and the availability of in person appointments. The patient expressed understanding and agreed to proceed.    History of Present Illness: Shawn Calderon is a 47 y.o. who identifies as a male who was assigned male at birth, and is being seen today for eustachian tube dysfunction.  Ear Pain: Patient presents with bilateral ear discomfort.  Symptoms include congestion, plugged sensation in both ears, and dry cough . Symptoms began a few days ago and are unchanged since that time. Patient denies chills, fever, myalgias, sore throat, and wheezing. Ear history: several previous ear infections and history of eustachian tube dysfunction requiring tube placements.  Now has hearing aids with loss of hearing due to chronic eustachian tube dysfunction.      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   Hyperlipidemia 12/27/2012   Heart transplant status 11/24/2011   Hypertension 11/24/2011   OCD (obsessive compulsive disorder) 11/24/2011    Allergies:  Allergies  Allergen Reactions   Sulfamethoxazole Hives and Rash   Amoxicillin-Pot Clavulanate Diarrhea  Clindamycin Other (See Comments)    Heartburn   Compazine [Prochlorperazine]     Anxiety    Reglan [Metoclopramide]    Sirolimus Other (See Comments)    Other Reaction(s): Other (See Comments)  GI distress   Medications:  Current Outpatient Medications:    [START ON 04/09/2023] cefdinir (OMNICEF) 300 MG capsule, Take 1 capsule (300  mg total) by mouth 2 (two) times daily for 5 days., Disp: 10 capsule, Rfl: 0   predniSONE (DELTASONE) 10 MG tablet, Directions for 6 day taper: Day 1: 2 tablets before breakfast, 1 after both lunch & dinner and 2 at bedtime Day 2: 1 tab before breakfast, 1 after both lunch & dinner and 2 at bedtime Day 3: 1 tab at each meal & 1 at bedtime Day 4: 1 tab at breakfast, 1 at lunch, 1 at bedtime Day 5: 1 tab at breakfast & 1 tab at bedtime Day 6: 1 tab at breakfast, Disp: 21 tablet, Rfl: 0   acetaminophen (TYLENOL) 500 MG tablet, Take 500-1,000 mg by mouth as needed., Disp: , Rfl:    aspirin EC 81 MG tablet, Take 1 tablet by mouth daily., Disp: , Rfl:    Calcium Carb-Cholecalciferol 500-15 MG-MCG TABS, Take 1 tablet by mouth daily., Disp: , Rfl:    cetirizine (ZYRTEC ALLERGY) 10 MG tablet, Take 1 tablet (10 mg total) by mouth daily., Disp: 90 tablet, Rfl: 1   citalopram (CELEXA) 20 MG tablet, Take 30 mg by mouth at bedtime. , Disp: , Rfl:    clonazePAM (KLONOPIN) 0.5 MG tablet, Take 1 mg by mouth 2 (two) times daily as needed for anxiety., Disp: , Rfl:    dicyclomine (BENTYL) 10 MG capsule, Take 1 capsule (10 mg total) by mouth every 6 (six) hours as needed for spasms (Abdominal pain)., Disp: 30 capsule, Rfl: 1   FIBER ADULT GUMMIES PO, Take 1-2 tablets by mouth daily., Disp: , Rfl:    fluticasone (FLONASE) 50 MCG/ACT nasal spray, Place 1 spray into both nostrils daily., Disp: 16 g, Rfl: 0   glycopyrrolate (ROBINUL) 1 MG tablet, TAKE 1 TABLET BY MOUTH TWICE A DAY, Disp: 60 tablet, Rfl: 0   magnesium oxide (MAG-OX) 400 MG tablet, Take 400 mg by mouth daily., Disp: , Rfl:    Multiple Vitamin (MULTIVITAMIN WITH MINERALS) TABS tablet, Take 1 tablet by mouth daily., Disp: , Rfl:    mycophenolate (CELLCEPT) 500 MG tablet, Take 500 mg by mouth. 3 tablets twice a day, Disp: , Rfl:    omeprazole (PRILOSEC) 40 MG capsule, Take 1 capsule (40 mg total) by mouth daily., Disp: 30 capsule, Rfl: 11   ondansetron (ZOFRAN)  4 MG tablet, Take 1 tablet (4 mg total) by mouth every 6 (six) hours as needed for nausea or vomiting., Disp: 30 tablet, Rfl: 1   pravastatin (PRAVACHOL) 20 MG tablet, Take 20 mg by mouth at bedtime., Disp: , Rfl:    tacrolimus (PROGRAF) 1 MG capsule, Take 2 mg by mouth 2 (two) times daily. , Disp: , Rfl:   Observations/Objective: Patient is well-developed, well-nourished in no acute distress.  Resting comfortably at home.  Head is normocephalic, atraumatic.  No labored breathing.  Speech is clear and coherent with logical content.  Patient is alert and oriented at baseline.    Assessment and Plan: 1. Disorder of both eustachian tubes - fluticasone (FLONASE) 50 MCG/ACT nasal spray; Place 1 spray into both nostrils daily.  Dispense: 16 g; Refill: 0 - predniSONE (DELTASONE) 10 MG tablet; Directions  for 6 day taper: Day 1: 2 tablets before breakfast, 1 after both lunch & dinner and 2 at bedtime Day 2: 1 tab before breakfast, 1 after both lunch & dinner and 2 at bedtime Day 3: 1 tab at each meal & 1 at bedtime Day 4: 1 tab at breakfast, 1 at lunch, 1 at bedtime Day 5: 1 tab at breakfast & 1 tab at bedtime Day 6: 1 tab at breakfast  Dispense: 21 tablet; Refill: 0 - cefdinir (OMNICEF) 300 MG capsule; Take 1 capsule (300 mg total) by mouth 2 (two) times daily for 5 days.  Dispense: 10 capsule; Refill: 0   Follow Up Instructions: I discussed the assessment and treatment plan with the patient. The patient was provided an opportunity to ask questions and all were answered. The patient agreed with the plan and demonstrated an understanding of the instructions.  A copy of instructions were sent to the patient via MyChart unless otherwise noted below.   The patient was advised to call back or seek an in-person evaluation if the symptoms worsen or if the condition fails to improve as anticipated.  Time:  I spent 12 minutes with the patient via telehealth technology discussing the above problems/concerns.     [name], NP

## 2023-04-03 NOTE — Patient Instructions (Signed)
Shawn Calderon, thank you for joining [name], NP for today's virtual visit.  While this provider is not your primary care provider (PCP), if your PCP is located in our provider database this encounter information will be shared with them immediately following your visit.   A [facility] MyChart account gives you access to today's visit and all your visits, tests, and labs performed at [facility] " click here if you don't have a [facility] MyChart account or go to [email or web address]  Consent: (Patient) Shawn Calderon provided verbal consent for this virtual visit at the beginning of the encounter.  Current Medications:  Current Outpatient Medications:    [START ON 04/09/2023] cefdinir (OMNICEF) 300 MG capsule, Take 1 capsule (300 mg total) by mouth 2 (two) times daily for 5 days., Disp: 10 capsule, Rfl: 0   predniSONE (DELTASONE) 10 MG tablet, Directions for 6 day taper: Day 1: 2 tablets before breakfast, 1 after both lunch & dinner and 2 at bedtime Day 2: 1 tab before breakfast, 1 after both lunch & dinner and 2 at bedtime Day 3: 1 tab at each meal & 1 at bedtime Day 4: 1 tab at breakfast, 1 at lunch, 1 at bedtime Day 5: 1 tab at breakfast & 1 tab at bedtime Day 6: 1 tab at breakfast, Disp: 21 tablet, Rfl: 0   acetaminophen (TYLENOL) 500 MG tablet, Take 500-1,000 mg by mouth as needed., Disp: , Rfl:    aspirin EC 81 MG tablet, Take 1 tablet by mouth daily., Disp: , Rfl:    Calcium Carb-Cholecalciferol 500-15 MG-MCG TABS, Take 1 tablet by mouth daily., Disp: , Rfl:    cetirizine (ZYRTEC ALLERGY) 10 MG tablet, Take 1 tablet (10 mg total) by mouth daily., Disp: 90 tablet, Rfl: 1   citalopram (CELEXA) 20 MG tablet, Take 30 mg by mouth at bedtime. , Disp: , Rfl:    clonazePAM (KLONOPIN) 0.5 MG tablet, Take 1 mg by mouth 2 (two) times daily as needed for anxiety., Disp: , Rfl:    dicyclomine (BENTYL) 10 MG capsule, Take 1 capsule (10 mg total) by mouth every 6 (six) hours  as needed for spasms (Abdominal pain)., Disp: 30 capsule, Rfl: 1   FIBER ADULT GUMMIES PO, Take 1-2 tablets by mouth daily., Disp: , Rfl:    fluticasone (FLONASE) 50 MCG/ACT nasal spray, Place 1 spray into both nostrils daily., Disp: 16 g, Rfl: 0   glycopyrrolate (ROBINUL) 1 MG tablet, TAKE 1 TABLET BY MOUTH TWICE A DAY, Disp: 60 tablet, Rfl: 0   magnesium oxide (MAG-OX) 400 MG tablet, Take 400 mg by mouth daily., Disp: , Rfl:    Multiple Vitamin (MULTIVITAMIN WITH MINERALS) TABS tablet, Take 1 tablet by mouth daily., Disp: , Rfl:    mycophenolate (CELLCEPT) 500 MG tablet, Take 500 mg by mouth. 3 tablets twice a day, Disp: , Rfl:    omeprazole (PRILOSEC) 40 MG capsule, Take 1 capsule (40 mg total) by mouth daily., Disp: 30 capsule, Rfl: 11   ondansetron (ZOFRAN) 4 MG tablet, Take 1 tablet (4 mg total) by mouth every 6 (six) hours as needed for nausea or vomiting., Disp: 30 tablet, Rfl: 1   pravastatin (PRAVACHOL) 20 MG tablet, Take 20 mg by mouth at bedtime., Disp: , Rfl:    tacrolimus (PROGRAF) 1 MG capsule, Take 2 mg by mouth 2 (two) times daily. , Disp: , Rfl:    Medications ordered in this encounter:  Meds ordered this encounter  Medications  fluticasone (FLONASE) 50 MCG/ACT nasal spray    Sig: Place 1 spray into both nostrils daily.    Dispense:  16 g    Refill:  0    Order Specific Question:   Supervising Provider    Answer:   Merrilee Jansky [[id number]]   predniSONE (DELTASONE) 10 MG tablet    Sig: Directions for 6 day taper: Day 1: 2 tablets before breakfast, 1 after both lunch & dinner and 2 at bedtime Day 2: 1 tab before breakfast, 1 after both lunch & dinner and 2 at bedtime Day 3: 1 tab at each meal & 1 at bedtime Day 4: 1 tab at breakfast, 1 at lunch, 1 at bedtime Day 5: 1 tab at breakfast & 1 tab at bedtime Day 6: 1 tab at breakfast    Dispense:  21 tablet    Refill:  0    Order Specific Question:   Supervising Provider    Answer:   Merrilee Jansky [[id number]]   cefdinir  (OMNICEF) 300 MG capsule    Sig: Take 1 capsule (300 mg total) by mouth 2 (two) times daily for 5 days.    Dispense:  10 capsule    Refill:  0    Available for pick up on 04-09-2023    Order Specific Question:   Supervising Provider    Answer:   Merrilee Jansky [[id number]]     *If you need refills on other medications prior to your next appointment, please contact your pharmacy*  Follow-Up: Call back or seek an in-person evaluation if the symptoms worsen or if the condition fails to improve as anticipated.  [facility] [phone]    If you have been instructed to have an in-person evaluation today at a local Urgent Care facility, please use the link below. It will take you to a list of all of our available [facility], including address, phone number and hours of operation. Please do not delay care.  [facility]  If you or a family member do not have a primary care provider, use the link below to schedule a visit and establish care. When you choose a [facility] primary care physician or advanced practice provider, you gain a long-term partner in health. Find a Primary Care Provider  Learn more about [facility] in-office and virtual care options: [facility] - Get Care Now

## 2023-04-14 ENCOUNTER — Encounter: Payer: Self-pay | Admitting: Internal Medicine

## 2023-04-30 ENCOUNTER — Telehealth: Payer: Managed Care, Other (non HMO) | Admitting: Family Medicine

## 2023-04-30 DIAGNOSIS — H6993 Unspecified Eustachian tube disorder, bilateral: Secondary | ICD-10-CM

## 2023-04-30 DIAGNOSIS — J40 Bronchitis, not specified as acute or chronic: Secondary | ICD-10-CM | POA: Diagnosis not present

## 2023-04-30 DIAGNOSIS — J069 Acute upper respiratory infection, unspecified: Secondary | ICD-10-CM

## 2023-04-30 MED ORDER — AZITHROMYCIN 250 MG PO TABS: ORAL_TABLET | ORAL | 0 refills | Status: AC

## 2023-04-30 MED ORDER — PROMETHAZINE-DM 6.25-15 MG/5ML PO SYRP: 5.0000 mL | ORAL_SOLUTION | Freq: Four times a day (QID) | ORAL | 0 refills | Status: AC | PRN

## 2023-04-30 MED ORDER — PREDNISONE 10 MG PO TABS: ORAL_TABLET | ORAL | 0 refills | Status: DC

## 2023-04-30 NOTE — Patient Instructions (Signed)

## 2023-04-30 NOTE — Progress Notes (Signed)
Virtual Visit Consent   Shawn Calderon, you are scheduled for a virtual visit with a [facility] provider today. Just as with appointments in the office, your consent must be obtained to participate. Your consent will be active for this visit and any virtual visit you may have with one of our providers in the next 365 days. If you have a MyChart account, a copy of this consent can be sent to you electronically.  As this is a virtual visit, video technology does not allow for your provider to perform a traditional examination. This may limit your provider's ability to fully assess your condition. If your provider identifies any concerns that need to be evaluated in person or the need to arrange testing (such as labs, EKG, etc.), we will make arrangements to do so. Although advances in technology are sophisticated, we cannot ensure that it will always work on either your end or our end. If the connection with a video visit is poor, the visit may have to be switched to a telephone visit. With either a video or telephone visit, we are not always able to ensure that we have a secure connection.  By engaging in this virtual visit, you consent to the provision of healthcare and authorize for your insurance to be billed (if applicable) for the services provided during this visit. Depending on your insurance coverage, you may receive a charge related to this service.  I need to obtain your verbal consent now. Are you willing to proceed with your visit today? Shawn Calderon has provided verbal consent on 04/30/2023 for a virtual visit (video or telephone). Georgana Curio, FNP  Date: 04/30/2023 12:35 PM  Virtual Visit via Video Note   I, Georgana Curio, connected with  Shawn Calderon  ([id number], 1976-09-21) on 04/30/23 at 12:30 PM EDT by a video-enabled telemedicine application and verified that I am speaking with the correct person using two identifiers.  Location: Patient: Virtual Visit Location Patient:  Home Provider: Virtual Visit Location Provider: Home Office   I discussed the limitations of evaluation and management by telemedicine and the availability of in person appointments. The patient expressed understanding and agreed to proceed.    History of Present Illness: Shawn Calderon is a 47 y.o. who identifies as a male who was assigned male at birth, and is being seen today for cough, keeping him up at night, head congestion, history on pneumonia and heart transplant. Daughter also has URI. He feels like he's barking at night. No fever. 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   Hyperlipidemia 12/27/2012   Heart transplant status (HCC) 11/24/2011   Hypertension 11/24/2011   OCD (obsessive compulsive disorder) 11/24/2011    Allergies:  Allergies  Allergen Reactions   Sulfamethoxazole Hives and Rash   Amoxicillin-Pot Clavulanate Diarrhea   Clindamycin Other (See Comments)    Heartburn   Compazine [Prochlorperazine]     Anxiety    Reglan [Metoclopramide]    Sirolimus Other (See Comments)    Other Reaction(s): Other (See Comments)  GI distress   Medications:  Current Outpatient Medications:  acetaminophen (TYLENOL) 500 MG tablet, Take 500-1,000 mg by mouth as needed., Disp: , Rfl:    aspirin EC 81 MG tablet, Take 1 tablet by mouth daily., Disp: , Rfl:    azithromycin (ZITHROMAX) 250 MG tablet, Take 2 tablets on day 1, then 1 tablet daily on days 2 through 5, Disp: 6 tablet, Rfl: 0   Calcium Carb-Cholecalciferol 500-15 MG-MCG TABS, Take 1 tablet by mouth daily., Disp: , Rfl:     cetirizine (ZYRTEC ALLERGY) 10 MG tablet, Take 1 tablet (10 mg total) by mouth daily., Disp: 90 tablet, Rfl: 1   citalopram (CELEXA) 20 MG tablet, Take 30 mg by mouth at bedtime. , Disp: , Rfl:    clonazePAM (KLONOPIN) 0.5 MG tablet, Take 1 mg by mouth 2 (two) times daily as needed for anxiety., Disp: , Rfl:    dicyclomine (BENTYL) 10 MG capsule, Take 1 capsule (10 mg total) by mouth every 6 (six) hours as needed for spasms (Abdominal pain)., Disp: 30 capsule, Rfl: 1   FIBER ADULT GUMMIES PO, Take 1-2 tablets by mouth daily., Disp: , Rfl:    fluticasone (FLONASE) 50 MCG/ACT nasal spray, Place 1 spray into both nostrils daily., Disp: 16 g, Rfl: 0   glycopyrrolate (ROBINUL) 1 MG tablet, TAKE 1 TABLET BY MOUTH TWICE A DAY, Disp: 60 tablet, Rfl: 0   magnesium oxide (MAG-OX) 400 MG tablet, Take 400 mg by mouth daily., Disp: , Rfl:    Multiple Vitamin (MULTIVITAMIN WITH MINERALS) TABS tablet, Take 1 tablet by mouth daily., Disp: , Rfl:    mycophenolate (CELLCEPT) 500 MG tablet, Take 500 mg by mouth. 3 tablets twice a day, Disp: , Rfl:    omeprazole (PRILOSEC) 40 MG capsule, Take 1 capsule (40 mg total) by mouth daily., Disp: 30 capsule, Rfl: 11   ondansetron (ZOFRAN) 4 MG tablet, Take 1 tablet (4 mg total) by mouth every 6 (six) hours as needed for nausea or vomiting., Disp: 30 tablet, Rfl: 1   pravastatin (PRAVACHOL) 20 MG tablet, Take 20 mg by mouth at bedtime., Disp: , Rfl:    predniSONE (DELTASONE) 10 MG tablet, Directions for 6 day taper: Day 1: 2 tablets before breakfast, 1 after both lunch & dinner and 2 at bedtime Day 2: 1 tab before breakfast, 1 after both lunch & dinner and 2 at bedtime Day 3: 1 tab at each meal & 1 at bedtime Day 4: 1 tab at breakfast, 1 at lunch, 1 at bedtime Day 5: 1 tab at breakfast & 1 tab at bedtime Day 6: 1 tab at breakfast, Disp: 21 tablet, Rfl: 0   promethazine-dextromethorphan (PROMETHAZINE-DM) 6.25-15 MG/5ML syrup, Take 5 mLs by mouth 4 (four) times daily as needed for  up to 10 days for cough., Disp: 118 mL, Rfl: 0   tacrolimus (PROGRAF) 1 MG capsule, Take 2 mg by mouth 2 (two) times daily. , Disp: , Rfl:   Observations/Objective: Patient is well-developed, well-nourished in no acute distress.  Resting comfortably  at home.  Head is normocephalic, atraumatic.  No labored breathing.  Speech is clear and coherent with logical content.  Patient is alert and oriented at baseline.    Assessment and Plan:  Increase fluids, humidifier at night, tylenol, UC if sx worsen. He played pickle ball this am and was fine from a resp status.   Follow Up Instructions: I discussed the assessment and treatment plan with the patient. The patient was provided an opportunity to ask questions and all were answered. The patient agreed  with the plan and demonstrated an understanding of the instructions.  A copy of instructions were sent to the patient via MyChart unless otherwise noted below.     The patient was advised to call back or seek an in-person evaluation if the symptoms worsen or if the condition fails to improve as anticipated.  Time:  I spent 10 minutes with the patient via telehealth technology discussing the above problems/concerns.    Georgana Curio, FNP

## 2023-06-04 ENCOUNTER — Encounter: Payer: Self-pay | Admitting: Internal Medicine

## 2023-06-04 NOTE — Telephone Encounter (Signed)
Sure. Refer to [facility] urology

## 2023-07-14 ENCOUNTER — Other Ambulatory Visit: Payer: Self-pay

## 2023-07-14 ENCOUNTER — Emergency Department (HOSPITAL_BASED_OUTPATIENT_CLINIC_OR_DEPARTMENT_OTHER)
Admission: EM | Admit: 2023-07-14 | Discharge: 2023-07-15 | Disposition: A | Discharge status: Home / Self Care | Payer: Managed Care, Other (non HMO) | Attending: Emergency Medicine | Admitting: Emergency Medicine

## 2023-07-14 ENCOUNTER — Encounter (HOSPITAL_BASED_OUTPATIENT_CLINIC_OR_DEPARTMENT_OTHER): Payer: Self-pay

## 2023-07-14 DIAGNOSIS — R059 Cough, unspecified: Secondary | ICD-10-CM | POA: Diagnosis present

## 2023-07-14 DIAGNOSIS — Z7982 Long term (current) use of aspirin: Secondary | ICD-10-CM | POA: Insufficient documentation

## 2023-07-14 DIAGNOSIS — I509 Heart failure, unspecified: Secondary | ICD-10-CM | POA: Insufficient documentation

## 2023-07-14 DIAGNOSIS — Z941 Heart transplant status: Secondary | ICD-10-CM | POA: Insufficient documentation

## 2023-07-14 DIAGNOSIS — U071 COVID-19: Secondary | ICD-10-CM | POA: Diagnosis not present

## 2023-07-14 LAB — RESP PANEL BY RT-PCR (RSV, FLU A&B, COVID)  RVPGX2
Influenza A by PCR: NEGATIVE
Influenza B by PCR: NEGATIVE
Resp Syncytial Virus by PCR: NEGATIVE
SARS Coronavirus 2 by RT PCR: POSITIVE — AB

## 2023-07-14 NOTE — ED Triage Notes (Addendum)
POV from home, A&O x 4, GCS 15, amb to triage  Pt c/o cough, body aches, runny nose since this morning Positive covid test at home

## 2023-07-15 MED ORDER — MOLNUPIRAVIR EUA 200MG CAPSULE: 4.0000 | ORAL_CAPSULE | Freq: Two times a day (BID) | ORAL | 0 refills | Status: AC

## 2023-07-15 MED ORDER — ALBUTEROL SULFATE HFA 108 (90 BASE) MCG/ACT IN AERS
2.0000 | INHALATION_SPRAY | RESPIRATORY_TRACT | Status: DC | PRN
  Administered 2023-07-15: 2 via RESPIRATORY_TRACT
  Filled 2023-07-15: qty 6.7

## 2023-07-15 NOTE — ED Notes (Signed)
Patient verbalizes understanding of discharge instructions. Opportunity for questioning and answers were provided. Armband removed by staff, pt discharged from ED. Ambulated out to lobby  

## 2023-07-15 NOTE — ED Provider Notes (Signed)
[facility] Provider Note: Shawn Dell, MD, FACEP  CSN: [id number] MRN: [id number] ARRIVAL: 07/14/23 at 2229 ROOM: DBOBS2/DBOBS2   CHIEF COMPLAINT  Cough   HISTORY OF PRESENT ILLNESS  07/15/23 1:46 AM Shawn Calderon is a 47 y.o. male who is status post heart transplant.  He is here with cough, body aches, nasal congestion and rhinorrhea since yesterday morning.  He had a positive COVID test at home.  He rates his body pain as a 4 out of 10.  He has also had a sensation of wheezing when he lies supine.  Symptoms are mild to moderate.   Past Medical History:  Diagnosis Date   CHF (congestive heart failure) (HCC)    Heart transplant recipient [facility]) 2008    Past Surgical History:  Procedure Laterality Date   APPENDECTOMY     COLONOSCOPY     heart transplant  2008   heart transplant recipient    Family History  Problem Relation Age of Onset   Diverticulosis Mother    Diverticulosis Father    Colon cancer Neg Hx    Pancreatic cancer Neg Hx    Esophageal cancer Neg Hx    Stomach cancer Neg Hx    Liver cancer Neg Hx    Rectal cancer Neg Hx     Social History   Tobacco Use   Smoking status: Never   Smokeless tobacco: Never  Substance Use Topics   Alcohol use: Not Currently   Drug use: Not Currently    Prior to Admission medications   Medication Sig Start Date End Date Taking? Authorizing Provider  molnupiravir EUA (LAGEVRIO) 200 mg CAPS capsule Take 4 capsules (800 mg total) by mouth 2 (two) times daily for 5 days. 07/15/23 07/20/23 Yes Azusena Erlandson, MD  acetaminophen (TYLENOL) 500 MG tablet Take 500-1,000 mg by mouth as needed. 05/22/22   [provider]  aspirin EC 81 MG tablet Take 1 tablet by mouth daily.    [provider]  Calcium Carb-Cholecalciferol 500-15 MG-MCG TABS Take 1 tablet by mouth daily. 12/25/10   [provider]  cetirizine (ZYRTEC ALLERGY) 10 MG tablet Take 1 tablet (10 mg total) by mouth daily. 03/01/22   Junie Spencer, FNP  citalopram (CELEXA) 20 MG tablet Take 30 mg by mouth at bedtime.  09/01/18 03/25/23  [provider]  clonazePAM (KLONOPIN) 0.5 MG tablet Take 1 mg by mouth 2 (two) times daily as needed for anxiety. 09/01/18   [provider]  dicyclomine (BENTYL) 10 MG capsule Take 1 capsule (10 mg total) by mouth every 6 (six) hours as needed for spasms (Abdominal pain). 02/23/23   Cirigliano, Vito V, DO  FIBER ADULT GUMMIES PO Take 1-2 tablets by mouth daily.    [provider]  fluticasone (FLONASE) 50 MCG/ACT nasal spray Place 1 spray into both nostrils daily. 04/03/23   [name], NP  glycopyrrolate (ROBINUL) 1 MG tablet TAKE 1 TABLET BY MOUTH TWICE A DAY 08/11/22   Hilarie Fredrickson, MD  magnesium oxide (MAG-OX) 400 MG tablet Take 400 mg by mouth daily. 12/25/10   [provider]  Multiple Vitamin (MULTIVITAMIN WITH MINERALS) TABS tablet Take 1 tablet by mouth daily.    [provider]  mycophenolate (CELLCEPT) 500 MG tablet Take 500 mg by mouth. 3 tablets twice a day 09/05/21   [provider]  omeprazole (PRILOSEC) 40 MG capsule Take 1 capsule (40 mg total) by mouth daily. 04/23/22   Yancey Flemings  N, MD  ondansetron (ZOFRAN) 4 MG tablet Take 1 tablet (4 mg total) by mouth every 6 (six) hours as needed for nausea or vomiting. 02/23/23   Cirigliano, Vito V, DO  pravastatin (PRAVACHOL) 20 MG tablet Take 20 mg by mouth at bedtime. 09/01/18   [provider]  predniSONE (DELTASONE) 10 MG tablet Directions for 6 day taper: Day 1: 2 tablets before breakfast, 1 after both lunch & dinner and 2 at bedtime Day 2: 1 tab before breakfast, 1 after both lunch & dinner and 2 at bedtime Day 3: 1 tab at each meal & 1 at bedtime Day 4: 1 tab at breakfast, 1 at lunch, 1 at bedtime Day 5: 1 tab at breakfast & 1 tab at bedtime Day 6: 1 tab at breakfast 04/30/23   Delorse Lek, FNP  tacrolimus (PROGRAF) 1 MG capsule Take 2 mg by mouth 2 (two) times daily.   09/01/18   [provider]    Allergies Sulfamethoxazole, Amoxicillin-pot clavulanate, Clindamycin, Compazine [prochlorperazine], Reglan [metoclopramide], and Sirolimus   REVIEW OF SYSTEMS  Negative except as noted here or in the History of Present Illness.   PHYSICAL EXAMINATION  Initial Vital Signs Blood pressure 121/65, pulse 89, temperature 99.4 F (37.4 C), resp. rate 18, height 5\' 8"  (1.727 m), weight 83.9 kg, SpO2 98%.  Examination General: Well-developed, well-nourished male in no acute distress; appearance consistent with age of record HENT: normocephalic; atraumatic Eyes: Normal appearance Neck: supple Heart: regular rate and rhythm Lungs: clear to auscultation bilaterally Abdomen: soft; nondistended; nontender; bowel sounds present Extremities: No deformity; full range of motion Neurologic: Awake, alert and oriented; motor function intact in all extremities and symmetric; no facial droop Skin: Warm and dry Psychiatric: Normal mood and affect   RESULTS  Summary of this visit's results, reviewed and interpreted by myself:   EKG Interpretation Date/Time:    Ventricular Rate:    PR Interval:    QRS Duration:    QT Interval:    QTC Calculation:   R Axis:      Text Interpretation:         Laboratory Studies: Results for orders placed or performed during the hospital encounter of 07/14/23 (from the past 24 hour(s))  Resp panel by RT-PCR (RSV, Flu A&B, Covid) Anterior Nasal Swab     Status: Abnormal   Collection Time: 07/14/23 10:39 PM   Specimen: Anterior Nasal Swab  Result Value Ref Range   SARS Coronavirus 2 by RT PCR POSITIVE (A) NEGATIVE   Influenza A by PCR NEGATIVE NEGATIVE   Influenza B by PCR NEGATIVE NEGATIVE   Resp Syncytial Virus by PCR NEGATIVE NEGATIVE   Imaging Studies: No results found.  ED COURSE and MDM  Nursing notes, initial and subsequent vitals signs, including pulse oximetry, reviewed and interpreted by myself.  Vitals:    07/14/23 2234 07/14/23 2236  BP: 121/65   Pulse: 89   Resp: 18   Temp: 99.4 F (37.4 C)   SpO2: 98%   Weight:  83.9 kg  Height:  5\' 8"  (1.727 m)   Medications  albuterol (VENTOLIN HFA) 108 (90 Base) MCG/ACT inhaler 2 puff (has no administration in time range)    The patient is immunocompromise due to his transplant status which puts him at risk of developing COVID complications.  Unfortunately Paxil of it has interactions with CellCept and tacrolimus.  We will prescribe molnupiravir as this does not interact with his immunosuppressive drugs.  We will also provide him with  an albuterol inhaler should he experience wheezing.  PROCEDURES  Procedures   ED DIAGNOSES     ICD-10-CM   1. COVID-19 virus infection  U07.1          Caidance Sybert, MD 07/15/23 9629

## 2023-10-13 ENCOUNTER — Telehealth: Payer: Managed Care, Other (non HMO) | Admitting: Family Medicine

## 2023-10-13 DIAGNOSIS — J40 Bronchitis, not specified as acute or chronic: Secondary | ICD-10-CM | POA: Diagnosis not present

## 2023-10-13 MED ORDER — ALBUTEROL SULFATE HFA 108 (90 BASE) MCG/ACT IN AERS: 1.0000 | INHALATION_SPRAY | Freq: Four times a day (QID) | RESPIRATORY_TRACT | 0 refills | Status: DC | PRN

## 2023-10-13 MED ORDER — PREDNISONE 10 MG (21) PO TBPK: ORAL_TABLET | ORAL | 0 refills | Status: DC

## 2023-10-13 NOTE — Patient Instructions (Signed)
Shawn Calderon, thank you for joining Shawn Finner, NP for today's virtual visit.  While this provider is not your primary care provider (PCP), if your PCP is located in our provider database this encounter information will be shared with them immediately following your visit.   A Shawn Calderon MyChart account gives you access to today's visit and all your visits, tests, and labs performed at [facility] " click here if you don't have a [facility] MyChart account or go to [email or web address]  Consent: (Patient) Shawn Calderon provided verbal consent for this virtual visit at the beginning of the encounter.  Current Medications:  Current Outpatient Medications:    acetaminophen (TYLENOL) 500 MG tablet, Take 500-1,000 mg by mouth as needed., Disp: , Rfl:    albuterol (VENTOLIN HFA) 108 (90 Base) MCG/ACT inhaler, Inhale 1-2 puffs into the lungs every 6 (six) hours as needed for wheezing or shortness of breath (cough)., Disp: 8 g, Rfl: 0   aspirin EC 81 MG tablet, Take 1 tablet by mouth daily., Disp: , Rfl:    Calcium Carb-Cholecalciferol 500-15 MG-MCG TABS, Take 1 tablet by mouth daily., Disp: , Rfl:    cetirizine (ZYRTEC ALLERGY) 10 MG tablet, Take 1 tablet (10 mg total) by mouth daily., Disp: 90 tablet, Rfl: 1   citalopram (CELEXA) 20 MG tablet, Take 30 mg by mouth at bedtime. , Disp: , Rfl:    clonazePAM (KLONOPIN) 0.5 MG tablet, Take 1 mg by mouth 2 (two) times daily as needed for anxiety., Disp: , Rfl:    dicyclomine (BENTYL) 10 MG capsule, Take 1 capsule (10 mg total) by mouth every 6 (six) hours as needed for spasms (Abdominal pain)., Disp: 30 capsule, Rfl: 1   FIBER ADULT GUMMIES PO, Take 1-2 tablets by mouth daily., Disp: , Rfl:    fluticasone (FLONASE) 50 MCG/ACT nasal spray, Place 1 spray into both nostrils daily., Disp: 16 g, Rfl: 0   glycopyrrolate (ROBINUL) 1 MG tablet, TAKE 1 TABLET BY MOUTH TWICE A DAY, Disp: 60 tablet, Rfl: 0   magnesium oxide (MAG-OX) 400 MG  tablet, Take 400 mg by mouth daily., Disp: , Rfl:    Multiple Vitamin (MULTIVITAMIN WITH MINERALS) TABS tablet, Take 1 tablet by mouth daily., Disp: , Rfl:    mycophenolate (CELLCEPT) 500 MG tablet, Take 500 mg by mouth. 3 tablets twice a day, Disp: , Rfl:    omeprazole (PRILOSEC) 40 MG capsule, Take 1 capsule (40 mg total) by mouth daily., Disp: 30 capsule, Rfl: 11   ondansetron (ZOFRAN) 4 MG tablet, Take 1 tablet (4 mg total) by mouth every 6 (six) hours as needed for nausea or vomiting., Disp: 30 tablet, Rfl: 1   pravastatin (PRAVACHOL) 20 MG tablet, Take 20 mg by mouth at bedtime., Disp: , Rfl:    predniSONE (STERAPRED UNI-PAK 21 TAB) 10 MG (21) TBPK tablet, Take as directed, Disp: 21 tablet, Rfl: 0   tacrolimus (PROGRAF) 1 MG capsule, Take 2 mg by mouth 2 (two) times daily. , Disp: , Rfl:    Medications ordered in this encounter:  Meds ordered this encounter  Medications   predniSONE (STERAPRED UNI-PAK 21 TAB) 10 MG (21) TBPK tablet    Sig: Take as directed    Dispense:  21 tablet    Refill:  0    Order Specific Question:   Supervising Provider    Answer:   Shawn Calderon [[id number]]   albuterol (VENTOLIN HFA) 108 (90 Base) MCG/ACT inhaler    Sig:  Inhale 1-2 puffs into the lungs every 6 (six) hours as needed for wheezing or shortness of breath (cough).    Dispense:  8 g    Refill:  0    Order Specific Question:   Supervising Provider    Answer:   Shawn Calderon [id number]     *If you need refills on other medications prior to your next appointment, please contact your pharmacy*  Follow-Up: Call back or seek an in-person evaluation if the symptoms worsen or if the condition fails to improve as anticipated.  [facility] [phone]  Other Instructions  continue Levaquin as his transplant provider ordered -added above and monitor for changes -in person if anything worsens   If you have been instructed to have an in-person evaluation today at a local Urgent  Care facility, please use the link below. It will take you to a list of all of our available [facility], including address, phone number and hours of operation. Please do not delay care.  [facility]  If you or a family member do not have a primary care provider, use the link below to schedule a visit and establish care. When you choose a [facility] primary care physician or advanced practice provider, you gain a long-term partner in health. Find a Primary Care Provider  Learn more about [facility]'s in-office and virtual care options: [facility] - Get Care Now

## 2023-10-13 NOTE — Progress Notes (Signed)
Virtual Visit Consent   Shawn Calderon, you are scheduled for a virtual visit with a [facility] provider today. Just as with appointments in the office, your consent must be obtained to participate. Your consent will be active for this visit and any virtual visit you may have with one of our providers in the next 365 days. If you have a MyChart account, a copy of this consent can be sent to you electronically.  As this is a virtual visit, video technology does not allow for your provider to perform a traditional examination. This may limit your provider's ability to fully assess your condition. If your provider identifies any concerns that need to be evaluated in person or the need to arrange testing (such as labs, EKG, etc.), we will make arrangements to do so. Although advances in technology are sophisticated, we cannot ensure that it will always work on either your end or our end. If the connection with a video visit is poor, the visit may have to be switched to a telephone visit. With either a video or telephone visit, we are not always able to ensure that we have a secure connection.  By engaging in this virtual visit, you consent to the provision of healthcare and authorize for your insurance to be billed (if applicable) for the services provided during this visit. Depending on your insurance coverage, you may receive a charge related to this service.  I need to obtain your verbal consent now. Are you willing to proceed with your visit today? Shawn Calderon has provided verbal consent on 10/13/2023 for a virtual visit (video or telephone). [name], NP  Date: 10/13/2023 1:50 PM  Virtual Visit via Video Note   I, [name], connected with  Shawn Calderon  ([id number], Apr 14, 1976) on 10/13/23 at  2:00 PM EDT by a video-enabled telemedicine application and verified that I am speaking with the correct person using two identifiers.  Location: Patient: Virtual Visit Location Patient:  Home Provider: Virtual Visit Location Provider: Home Office   I discussed the limitations of evaluation and management by telemedicine and the availability of in person appointments. The patient expressed understanding and agreed to proceed.    History of Present Illness: Shawn Calderon is a 47 y.o. who identifies as a male who was assigned male at birth, and is being seen today for cough  Has used albuterol inhaler (needs refill), benadryl at night. Is currently taking Levaquin daily as directed by provider (takes this when flying for work, due to heart transplant) Started it late this time.And has developed a cough this week. Reports history of PNA last year.  Denies shortness of breath, chest pain, fevers or chills.   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   Hyperlipidemia 12/27/2012   Heart transplant status (HCC) 11/24/2011   Hypertension 11/24/2011   OCD (obsessive compulsive disorder) 11/24/2011    Allergies:  Allergies  Allergen Reactions   Sulfamethoxazole Hives and Rash   Amoxicillin-Pot Clavulanate Diarrhea   Clindamycin Other (See Comments)    Heartburn   Compazine [Prochlorperazine]     Anxiety  Reglan [Metoclopramide]    Sirolimus Other (See Comments)    Other Reaction(s): Other (See Comments)  GI distress   Medications:  Current Outpatient Medications:    acetaminophen (TYLENOL) 500 MG tablet, Take 500-1,000 mg by mouth as needed., Disp: , Rfl:    aspirin EC 81 MG tablet, Take 1 tablet by mouth daily., Disp: , Rfl:    Calcium Carb-Cholecalciferol 500-15 MG-MCG TABS, Take 1  tablet by mouth daily., Disp: , Rfl:    cetirizine (ZYRTEC ALLERGY) 10 MG tablet, Take 1 tablet (10 mg total) by mouth daily., Disp: 90 tablet, Rfl: 1   citalopram (CELEXA) 20 MG tablet, Take 30 mg by mouth at bedtime. , Disp: , Rfl:    clonazePAM (KLONOPIN) 0.5 MG tablet, Take 1 mg by mouth 2 (two) times daily as needed for anxiety., Disp: , Rfl:    dicyclomine (BENTYL) 10 MG capsule, Take 1 capsule (10 mg total) by mouth every 6 (six) hours as needed for spasms (Abdominal pain)., Disp: 30 capsule, Rfl: 1   FIBER ADULT GUMMIES PO, Take 1-2 tablets by mouth daily., Disp: , Rfl:    fluticasone (FLONASE) 50 MCG/ACT nasal spray, Place 1 spray into both nostrils daily., Disp: 16 g, Rfl: 0   glycopyrrolate (ROBINUL) 1 MG tablet, TAKE 1 TABLET BY MOUTH TWICE A DAY, Disp: 60 tablet, Rfl: 0   magnesium oxide (MAG-OX) 400 MG tablet, Take 400 mg by mouth daily., Disp: , Rfl:    Multiple Vitamin (MULTIVITAMIN WITH MINERALS) TABS tablet, Take 1 tablet by mouth daily., Disp: , Rfl:    mycophenolate (CELLCEPT) 500 MG tablet, Take 500 mg by mouth. 3 tablets twice a day, Disp: , Rfl:    omeprazole (PRILOSEC) 40 MG capsule, Take 1 capsule (40 mg total) by mouth daily., Disp: 30 capsule, Rfl: 11   ondansetron (ZOFRAN) 4 MG tablet, Take 1 tablet (4 mg total) by mouth every 6 (six) hours as needed for nausea or vomiting., Disp: 30 tablet, Rfl: 1   pravastatin (PRAVACHOL) 20 MG tablet, Take 20 mg by mouth at bedtime., Disp: , Rfl:    predniSONE (DELTASONE) 10 MG tablet, Directions for 6 day taper: Day 1: 2 tablets before breakfast, 1 after both lunch & dinner and 2 at bedtime Day 2: 1 tab before breakfast, 1 after both lunch & dinner and 2 at bedtime Day 3: 1 tab at each meal & 1 at bedtime Day 4: 1 tab at breakfast, 1 at lunch, 1 at bedtime Day 5: 1 tab at breakfast & 1 tab at bedtime Day 6: 1 tab at breakfast, Disp: 21 tablet, Rfl: 0   tacrolimus (PROGRAF) 1 MG capsule, Take 2 mg by mouth 2 (two) times daily. , Disp: ,  Rfl:   Observations/Objective: Patient is well-developed, well-nourished in no acute distress.  Resting comfortably  at home.  Head is normocephalic, atraumatic.  No labored breathing.  Speech is clear and coherent with logical content.  Patient is alert and oriented at baseline.    Assessment and Plan:    1 Bronchitis  - predniSONE (STERAPRED UNI-PAK 21 TAB) 10 MG (21) TBPK tablet; Take as directed  Dispense: 21 tablet; Refill: 0 - albuterol (VENTOLIN HFA) 108 (90 Base) MCG/ACT inhaler; Inhale 1-2 puffs into the lungs every 6 (six) hours as needed for wheezing or shortness of breath (cough).  Dispense: 8 g; Refill: 0  -continue Levaquin as his transplant provider ordered -added above and monitor for changes -in person if anything worsens  Reviewed side  effects, risks and benefits of medication.    Patient acknowledged agreement and understanding of the plan.   Past Medical, Surgical, Social History, Allergies, and Medications have been Reviewed.     Follow Up Instructions: I discussed the assessment and treatment plan with the patient. The patient was provided an opportunity to ask questions and all were answered. The patient agreed with the plan and demonstrated an understanding of the instructions.  A copy of instructions were sent to the patient via MyChart unless otherwise noted below.     The patient was advised to call back or seek an in-person evaluation if the symptoms worsen or if the condition fails to improve as anticipated.    [name], NP

## 2023-10-27 ENCOUNTER — Emergency Department (HOSPITAL_BASED_OUTPATIENT_CLINIC_OR_DEPARTMENT_OTHER): Payer: Managed Care, Other (non HMO) | Admitting: Radiology

## 2023-10-27 ENCOUNTER — Encounter (HOSPITAL_BASED_OUTPATIENT_CLINIC_OR_DEPARTMENT_OTHER): Payer: Self-pay | Admitting: Emergency Medicine

## 2023-10-27 ENCOUNTER — Emergency Department (HOSPITAL_BASED_OUTPATIENT_CLINIC_OR_DEPARTMENT_OTHER): Payer: Managed Care, Other (non HMO)

## 2023-10-27 ENCOUNTER — Emergency Department (HOSPITAL_BASED_OUTPATIENT_CLINIC_OR_DEPARTMENT_OTHER)
Admission: EM | Admit: 2023-10-27 | Discharge: 2023-10-27 | Disposition: A | Discharge status: Home / Self Care | Payer: Managed Care, Other (non HMO) | Attending: Emergency Medicine | Admitting: Emergency Medicine

## 2023-10-27 ENCOUNTER — Other Ambulatory Visit (HOSPITAL_BASED_OUTPATIENT_CLINIC_OR_DEPARTMENT_OTHER): Payer: Self-pay

## 2023-10-27 ENCOUNTER — Telehealth (HOSPITAL_BASED_OUTPATIENT_CLINIC_OR_DEPARTMENT_OTHER): Payer: Self-pay | Admitting: Emergency Medicine

## 2023-10-27 ENCOUNTER — Other Ambulatory Visit: Payer: Self-pay

## 2023-10-27 DIAGNOSIS — R059 Cough, unspecified: Secondary | ICD-10-CM | POA: Diagnosis present

## 2023-10-27 DIAGNOSIS — Z7982 Long term (current) use of aspirin: Secondary | ICD-10-CM | POA: Insufficient documentation

## 2023-10-27 DIAGNOSIS — J189 Pneumonia, unspecified organism: Secondary | ICD-10-CM | POA: Diagnosis not present

## 2023-10-27 LAB — BASIC METABOLIC PANEL
Anion gap: 6 (ref 5–15)
BUN: 24 mg/dL — ABNORMAL HIGH (ref 6–20)
CO2: 26 mmol/L (ref 22–32)
Calcium: 9.5 mg/dL (ref 8.9–10.3)
Chloride: 107 mmol/L (ref 98–111)
Creatinine, Ser: 1.3 mg/dL — ABNORMAL HIGH (ref 0.61–1.24)
GFR, Estimated: >60 mL/min (ref >60)
Glucose, Bld: 86 mg/dL (ref 70–99)
Potassium: 4.4 mmol/L (ref 3.5–5.1)
Sodium: 139 mmol/L (ref 135–145)

## 2023-10-27 LAB — CBC
HCT: 40.6 % (ref 39.0–52.0)
Hemoglobin: 13.1 g/dL (ref 13.0–17.0)
MCH: 27.3 pg (ref 26.0–34.0)
MCHC: 32.3 g/dL (ref 30.0–36.0)
MCV: 84.8 fL (ref 80.0–100.0)
Platelets: 176 10*3/uL (ref 150–400)
RBC: 4.79 MIL/uL (ref 4.22–5.81)
RDW: 13.2 % (ref 11.5–15.5)
WBC: 5.3 10*3/uL (ref 4.0–10.5)
nRBC: 0 % (ref 0.0–0.2)

## 2023-10-27 MED ORDER — AZITHROMYCIN 250 MG PO TABS
500.0000 mg | ORAL_TABLET | Freq: Once | ORAL | Status: AC
  Administered 2023-10-27: 500 mg via ORAL
  Filled 2023-10-27: qty 2

## 2023-10-27 MED ORDER — AMOXICILLIN-POT CLAVULANATE 875-125 MG PO TABS
1.0000 | ORAL_TABLET | Freq: Two times a day (BID) | ORAL | 0 refills | Status: DC
  Filled 2023-10-27: qty 14, 7d supply, fill #0

## 2023-10-27 MED ORDER — AMOXICILLIN-POT CLAVULANATE 875-125 MG PO TABS
1.0000 | ORAL_TABLET | Freq: Once | ORAL | Status: AC
  Administered 2023-10-27: 1 via ORAL
  Filled 2023-10-27: qty 1

## 2023-10-27 MED ORDER — CEFPODOXIME PROXETIL 200 MG PO TABS
200.0000 mg | ORAL_TABLET | Freq: Two times a day (BID) | ORAL | 0 refills | Status: DC
  Filled 2023-10-27: qty 10, 5d supply, fill #0

## 2023-10-27 MED ORDER — AZITHROMYCIN 250 MG PO TABS
250.0000 mg | ORAL_TABLET | Freq: Every day | ORAL | 0 refills | Status: DC
  Filled 2023-10-27: qty 6, 5d supply, fill #0

## 2023-10-27 NOTE — Telephone Encounter (Cosign Needed)
Was contacted by pharmacy and advised the patient did not want to be on Augmentin as he had diarrhea in the past.  For this reason started him on cefpodoxime for his pneumonia.

## 2023-10-27 NOTE — ED Notes (Signed)
Discharge paperwork given and verbally understood. 

## 2023-10-27 NOTE — ED Triage Notes (Signed)
Pt c/o cough x 2 months. Recent tx with abx x2, no improvement. Immuno-compromised. Denies shob

## 2023-10-27 NOTE — ED Provider Notes (Signed)
[place] EMERGENCY DEPARTMENT AT [facility] Provider Note   CSN: [id number] Arrival date & time: 10/27/23  0847     History  Chief Complaint  Patient presents with   Cough   HPI Shawn Calderon is a 47 y.o. male with history of heart transplant 2008 presenting for cough. Has been going on since 2 months ago.  States it is persistent and a really "nasty cough".  Denies fever, chest pain and shortness of breath. States he was on a course of Levaquin but did not seem to help.  States he is followed by [facility] for his heart transplant.   Cough      Home Medications Prior to Admission medications   Medication Sig Start Date End Date Taking? Authorizing Provider  amoxicillin-clavulanate (AUGMENTIN) 875-125 MG tablet Take 1 tablet by mouth every 12 (twelve) hours. 10/27/23  Yes Gareth Eagle, PA-C  azithromycin (ZITHROMAX) 250 MG tablet Take 1 tablet (250 mg total) by mouth daily. Take first 2 tablets together, then 1 every day until finished. 10/27/23  Yes Gareth Eagle, PA-C  acetaminophen (TYLENOL) 500 MG tablet Take 500-1,000 mg by mouth as needed. 05/22/22   [provider]  albuterol (VENTOLIN HFA) 108 (90 Base) MCG/ACT inhaler Inhale 1-2 puffs into the lungs every 6 (six) hours as needed for wheezing or shortness of breath (cough). 10/13/23   [name], NP  aspirin EC 81 MG tablet Take 1 tablet by mouth daily.    [provider]  Calcium Carb-Cholecalciferol 500-15 MG-MCG TABS Take 1 tablet by mouth daily. 12/25/10   [provider]  cetirizine (ZYRTEC ALLERGY) 10 MG tablet Take 1 tablet (10 mg total) by mouth daily. 03/01/22   Junie Spencer, FNP  citalopram (CELEXA) 20 MG tablet Take 30 mg by mouth at bedtime.  09/01/18 03/25/23  [provider]  clonazePAM (KLONOPIN) 0.5 MG tablet Take 1 mg by mouth 2 (two) times daily as needed for anxiety. 09/01/18   [provider]  dicyclomine (BENTYL) 10 MG capsule Take 1 capsule  (10 mg total) by mouth every 6 (six) hours as needed for spasms (Abdominal pain). 02/23/23   Cirigliano, Vito V, DO  FIBER ADULT GUMMIES PO Take 1-2 tablets by mouth daily.    [provider]  fluticasone (FLONASE) 50 MCG/ACT nasal spray Place 1 spray into both nostrils daily. 04/03/23   [name], NP  glycopyrrolate (ROBINUL) 1 MG tablet TAKE 1 TABLET BY MOUTH TWICE A DAY 08/11/22   Hilarie Fredrickson, MD  magnesium oxide (MAG-OX) 400 MG tablet Take 400 mg by mouth daily. 12/25/10   [provider]  Multiple Vitamin (MULTIVITAMIN WITH MINERALS) TABS tablet Take 1 tablet by mouth daily.    [provider]  mycophenolate (CELLCEPT) 500 MG tablet Take 500 mg by mouth. 3 tablets twice a day 09/05/21   [provider]  omeprazole (PRILOSEC) 40 MG capsule Take 1 capsule (40 mg total) by mouth daily. 04/23/22   Hilarie Fredrickson, MD  ondansetron (ZOFRAN) 4 MG tablet Take 1 tablet (4 mg total) by mouth every 6 (six) hours as needed for nausea or vomiting. 02/23/23   Cirigliano, Vito V, DO  pravastatin (PRAVACHOL) 20 MG tablet Take 20 mg by mouth at bedtime. 09/01/18   [provider]  predniSONE (STERAPRED UNI-PAK 21 TAB) 10 MG (21) TBPK tablet Take as directed 10/13/23   [name], NP  tacrolimus (PROGRAF) 1 MG capsule Take 2 mg by  mouth 2 (two) times daily.  09/01/18   [provider]      Allergies    Sulfamethoxazole, Amoxicillin-pot clavulanate, Clindamycin, Compazine [prochlorperazine], Reglan [metoclopramide], and Sirolimus    Review of Systems   Review of Systems  Respiratory:  Positive for cough.     Physical Exam Updated Vital Signs BP 114/71 (BP Location: Right Arm)   Pulse 74   Temp 98.7 F (37.1 C) (Oral)   Resp 16   Wt 80.7 kg   SpO2 95%   BMI 27.06 kg/m  Physical Exam Vitals and nursing note reviewed.  HENT:     Head: Normocephalic and atraumatic.     Mouth/Throat:     Mouth: Mucous membranes are moist.  Eyes:      General:        Right eye: No discharge.        Left eye: No discharge.     Conjunctiva/sclera: Conjunctivae normal.  Cardiovascular:     Rate and Rhythm: Normal rate and regular rhythm.     Pulses: Normal pulses.     Heart sounds: Normal heart sounds.  Pulmonary:     Effort: Pulmonary effort is normal.     Breath sounds: Normal breath sounds. No decreased breath sounds, wheezing, rhonchi or rales.  Abdominal:     General: Abdomen is flat.     Palpations: Abdomen is soft.  Skin:    General: Skin is warm and dry.  Neurological:     General: No focal deficit present.  Psychiatric:        Mood and Affect: Mood normal.     ED Results / Procedures / Treatments   Labs (all labs ordered are listed, but only abnormal results are displayed) Labs Reviewed  BASIC METABOLIC PANEL - Abnormal; Notable for the following components:      Result Value   BUN 24 (*)    Creatinine, Ser 1.30 (*)    All other components within normal limits  CBC    EKG None  Radiology CT Chest Wo Contrast  Result Date: 10/27/2023 CLINICAL DATA:  Chronic cough for 2 months, history of heart transplant EXAM: CT CHEST WITHOUT CONTRAST TECHNIQUE: Multidetector CT imaging of the chest was performed following the standard protocol without IV contrast. RADIATION DOSE REDUCTION: This exam was performed according to the departmental dose-optimization program which includes automated exposure control, adjustment of the mA and/or kV according to patient size and/or use of iterative reconstruction technique. COMPARISON:  Chest CT dated November 28, 2022 FINDINGS: Cardiovascular: Prior cardiac transplantation. Cardiomegaly. No pericardial effusion. Moderate coronary artery calcifications. Normal caliber thoracic aorta with mild atherosclerotic disease. Mediastinum/Nodes: Esophagus and thyroid are unremarkable. No enlarged lymph nodes seen in the chest. Lungs/Pleura: Central airways are patent. Patchy consolidations of the  left-greater-than-right lower lobes with numerous satellite nodules. Additional clustered small nodule seen in the right middle lobe. No pleural effusion or pneumothorax. Upper Abdomen: Partially visualized simple appearing cyst of the right kidney. Musculoskeletal: Prior median sternotomy with intact sternal wires. No aggressive appearing osseous lesions. IMPRESSION: 1. Patchy consolidations of the left-greater-than-right lower lobes with numerous satellite nodules, findings are likely due to infection or aspiration. Follow-up chest CT is recommended in 3 months to ensure resolution. 2. Coronary artery calcifications and aortic Atherosclerosis (ICD10-I70.0). Electronically Signed   By: Allegra Lai M.D.   On: 10/27/2023 14:15   DG Chest 2 View  Result Date: 10/27/2023 CLINICAL DATA:  Cough. EXAM: CHEST - 2 VIEW COMPARISON:  11/28/2022. FINDINGS: There  are heterogeneous opacities in the left lower lobe, concerning for pneumonia. Follow-up to clearing is recommended. Bilateral lung fields are otherwise clear. Bilateral costophrenic angles are clear. Normal cardio-mediastinal silhouette. Sternotomy wires noted. No acute osseous abnormalities. The soft tissues are within normal limits. IMPRESSION: *Left lung lower lobe pneumonia. Follow-up to clearing is recommended. Electronically Signed   By: Jules Schick M.D.   On: 10/27/2023 10:05    Procedures Procedures    Medications Ordered in ED Medications  amoxicillin-clavulanate (AUGMENTIN) 875-125 MG per tablet 1 tablet (has no administration in time range)  azithromycin (ZITHROMAX) tablet 500 mg (has no administration in time range)    ED Course/ Medical Decision Making/ A&P                                 Medical Decision Making Amount and/or Complexity of Data Reviewed Labs: ordered. Radiology: ordered.  Risk Prescription drug management.   Initial Impression and Ddx 47 year old well-appearing male presenting for cough.  Exam was  unremarkable.  DDx includes pneumonia, lung abscess, URI, sepsis, other. Patient PMH that increases complexity of ED encounter:  history of heart transplant on antirejection medications  Interpretation of Diagnostics - I independent reviewed and interpreted the labs as followed: no acute findings  - I independently visualized the following imaging with scope of interpretation limited to determining acute life threatening conditions related to emergency care: X-ray revealed findings consistent with pneumonia.  CT chest, which revealed patchy consolidations of the left-greater-than-right lower lobes with numerous satellite nodules, findings are likely due to infection or aspiration.  Patient Reassessment and Ultimate Disposition/Management Given the persistence of pneumonia after treatment with Levaquin along with a cough, felt it prompted further workup with CT to rule out possible lung abscess.  Fortunately no suggestions of lung abscess on CT and patient looks nontoxic without any evidence of sepsis during this encounter today.  Patient did mention he has had issues with reflux and has just started taking Pepcid, could be a lingering aspiration pneumonia.  Started him on Augmentin and added azithromycin to cover for atypicals and advised to follow-up with his transplant medical providers at [facility].  Also advised to continue taking the Pepcid and discussed with him that he may need to be started on a PPI and to ask his transplant team about this.  Discussed pertinent return precautions.  Vital stable.  Discharged good condition.  Patient management required discussion with the following services or consulting groups:  None  Complexity of Problems Addressed Acute complicated illness or Injury  Additional Data Reviewed and Analyzed Further history obtained from: Past medical history and medications listed in the EMR, Prior ED visit notes, and Recent discharge summary  Patient Encounter Risk  Assessment Prescriptions         Final Clinical Impression(s) / ED Diagnoses Final diagnoses:  Pneumonia due to infectious organism, unspecified laterality, unspecified part of lung    Rx / DC Orders ED Discharge Orders          Ordered    amoxicillin-clavulanate (AUGMENTIN) 875-125 MG tablet  Every 12 hours        10/27/23 1424    azithromycin (ZITHROMAX) 250 MG tablet  Daily        10/27/23 1424              Gareth Eagle, PA-C 10/27/23 1432    Ernie Avena, MD 10/27/23 1526

## 2023-10-27 NOTE — Discharge Instructions (Addendum)
Evaluation today revealed that you have an ongoing pneumonia.  I am starting on Augmentin and azithromycin this will expand your antibiotic coverage to include atypicals.  Recommend you continue taking these antibiotics at home and follow-up with your transplant team at [facility].  There is also evidence of aspiration on the CT of your chest, you may need to start taking a PPI.  Would also ask your transplant team about this as a potential treatment option moving forward.  If you develop a fever, chest pain, shortness of breath, lethargy or any other concerning symptoms please return emergency department further evaluation.

## 2023-11-29 ENCOUNTER — Other Ambulatory Visit: Payer: Self-pay

## 2023-12-13 ENCOUNTER — Ambulatory Visit: Payer: Managed Care, Other (non HMO) | Admitting: Nurse Practitioner

## 2023-12-13 VITALS — BP 112/64 | HR 68 | Wt 184.0 lb

## 2023-12-13 DIAGNOSIS — Z941 Heart transplant status: Secondary | ICD-10-CM

## 2023-12-13 DIAGNOSIS — K219 Gastro-esophageal reflux disease without esophagitis: Secondary | ICD-10-CM | POA: Diagnosis not present

## 2023-12-13 DIAGNOSIS — F419 Anxiety disorder, unspecified: Secondary | ICD-10-CM | POA: Insufficient documentation

## 2023-12-13 DIAGNOSIS — E785 Hyperlipidemia, unspecified: Secondary | ICD-10-CM

## 2023-12-13 DIAGNOSIS — K4090 Unilateral inguinal hernia, without obstruction or gangrene, not specified as recurrent: Secondary | ICD-10-CM | POA: Insufficient documentation

## 2023-12-13 NOTE — Assessment & Plan Note (Signed)
On pantoprazole 40 mg twice daily Continue current medication

## 2023-12-13 NOTE — Assessment & Plan Note (Signed)
1. Inguinal hernia, left (Primary)  - Ambulatory referral to General Surgery - US Abdomen Limited; Future Educational material on inguinal hernia provided and reviewed with the patient

## 2023-12-13 NOTE — Assessment & Plan Note (Signed)
On clonazepam 1 mg twice daily as needed, Celexa 30 mg daily Medication managed by psychiatrist Continue current medication and maintain close follow-up with psychiatrist

## 2023-12-13 NOTE — Patient Instructions (Signed)
1. Inguinal hernia, left (Primary)  - Ambulatory referral to General Surgery - US Abdomen Limited; Future    It is important that you exercise regularly at least 30 minutes 5 times a week as tolerated  Think about what you will eat, plan ahead. Choose " clean, green, fresh or frozen" over canned, processed or packaged foods which are more sugary, salty and fatty. 70 to 75% of food eaten should be vegetables and fruit. Three meals at set times with snacks allowed between meals, but they must be fruit or vegetables. Aim to eat over a 12 hour period , example 7 am to 7 pm, and STOP after  your last meal of the day. Drink water,generally about 64 ounces per day, no other drink is as healthy. Fruit juice is best enjoyed in a healthy way, by EATING the fruit.  Thanks for choosing [facility] we consider it a privelige to serve you.

## 2023-12-13 NOTE — Progress Notes (Signed)
New Patient Office Visit  Subjective:  Patient ID: Shawn Calderon, male    DOB: 1976-03-05  Age: 47 y.o. MRN: [id number]  CC:  Chief Complaint  Patient presents with   Establish Care   Hernia   Gastroesophageal Reflux    HPI [name] is a 47 y.o. male  has a past medical history of Anxiety, CHF (congestive heart failure) (HCC), and Heart transplant recipient [facility]) (2008).  Patient presented establish care for his chronic medical conditions  Has history of heart transplant about 17 years ago ,he has been following up regularly with the transplant team at [facility] health, patient currently denies chest pain, shortness of breath, edema, stated that the transplant team are planning on doing a redo transplant in the nearest future.  He is currently on prednisone 5 mg daily, Prograf 2 mg every 12 hours, not taking mycophenolate.  Patient was recently on admission for pneumonia last month, he currently denies cough, shortness of breath, wheezing  Patient complaining of left inguinal hernia that started after his recent hospitalization, states that the hernia bulges out with coughing and the site is sore on tenderness, Stated that he usually pushes it back in when it is bulges out. No complaints of dysuria, urinary hesitancy, abdominal pain nausea vomiting.    Patient declined the flu vaccine stated that he has had Tdap vaccine in the past 10 years    Past Medical History:  Diagnosis Date   Anxiety    CHF (congestive heart failure) (HCC)    Heart transplant recipient [facility]) 2008    Past Surgical History:  Procedure Laterality Date   APPENDECTOMY     COLONOSCOPY     heart transplant  2008   heart transplant recipient   HEART TRANSPLANT      Family History  Problem Relation Age of Onset   Diverticulosis Mother    Diverticulosis Father    Colon cancer Neg Hx    Pancreatic cancer Neg Hx    Esophageal cancer Neg Hx    Stomach cancer Neg Hx    Liver cancer Neg Hx    Rectal  cancer Neg Hx     Social History   Socioeconomic History   Marital status: Married    Spouse name: Not on file   Number of children: 3   Years of education: Not on file   Highest education level: Bachelor's degree (e.g., BA, AB, BS)  Occupational History   Not on file  Tobacco Use   Smoking status: Never   Smokeless tobacco: Never  Substance and Sexual Activity   Alcohol use: Not Currently   Drug use: Not Currently   Sexual activity: Yes  Other Topics Concern   Not on file  Social History Narrative   Lives with his spouse   Social Drivers of Health   Financial Resource Strain: Low Risk  (12/13/2023)   Overall Financial Resource Strain (CARDIA)    Difficulty of Paying Living Expenses: Not hard at all  Food Insecurity: No Food Insecurity (12/13/2023)   Hunger Vital Sign    Worried About Running Out of Food in the Last Year: Never true    Ran Out of Food in the Last Year: Never true  Transportation Needs: No Transportation Needs (12/13/2023)   PRAPARE - Administrator, Civil Service (Medical): No    Lack of Transportation (Non-Medical): No  Physical Activity: Sufficiently Active (12/13/2023)   Exercise Vital Sign    Days of Exercise per Week:  3 days    Minutes of Exercise per Session: 60 min  Stress: No Stress Concern Present (12/13/2023)   [employer] of Occupational Health - Occupational Stress Questionnaire    Feeling of Stress : Not at all  Social Connections: Unknown (12/13/2023)   Social Connection and Isolation Panel [NHANES]    Frequency of Communication with Friends and Family: Three times a week    Frequency of Social Gatherings with Friends and Family: Once a week    Attends Religious Services: Patient declined    Database administrator or Organizations: No    Attends Engineer, structural: Not on file    Marital Status: Married  Catering manager Violence: Not on file    ROS Review of Systems  Constitutional:  Negative for  appetite change, chills, fatigue and fever.  HENT:  Negative for congestion, postnasal drip, rhinorrhea and sneezing.   Respiratory:  Negative for cough, shortness of breath and wheezing.   Cardiovascular:  Negative for chest pain, palpitations and leg swelling.  Gastrointestinal:  Negative for abdominal pain, constipation, nausea and vomiting.  Genitourinary:  Negative for difficulty urinating, dysuria, flank pain and frequency.       Inguinal hernia   Musculoskeletal:  Negative for arthralgias, back pain, joint swelling and myalgias.  Skin:  Negative for color change, pallor, rash and wound.  Neurological:  Negative for dizziness, facial asymmetry, weakness, numbness and headaches.  Psychiatric/Behavioral:  Negative for behavioral problems, confusion, self-injury and suicidal ideas.     Objective:   Today's Vitals: BP 112/64   Pulse 68   Wt 184 lb (83.5 kg)   SpO2 97%   BMI 27.98 kg/m   Physical Exam Vitals and nursing note reviewed. Exam conducted with a chaperone present.  Constitutional:      General: He is not in acute distress.    Appearance: Normal appearance. He is not ill-appearing, toxic-appearing or diaphoretic.  HENT:     Mouth/Throat:     Mouth: Mucous membranes are moist.     Pharynx: Oropharynx is clear. No oropharyngeal exudate or posterior oropharyngeal erythema.  Eyes:     General: No scleral icterus.       Right eye: No discharge.        Left eye: No discharge.     Extraocular Movements: Extraocular movements intact.     Conjunctiva/sclera: Conjunctivae normal.  Cardiovascular:     Rate and Rhythm: Normal rate and regular rhythm.     Pulses: Normal pulses.     Heart sounds: Normal heart sounds. No murmur heard.    No friction rub. No gallop.  Pulmonary:     Effort: Pulmonary effort is normal. No respiratory distress.     Breath sounds: Normal breath sounds. No stridor. No wheezing, rhonchi or rales.  Chest:     Chest wall: No tenderness.  Abdominal:      General: There is no distension.     Palpations: Abdomen is soft.     Tenderness: There is no abdominal tenderness. There is no right CVA tenderness, left CVA tenderness or guarding.     Hernia: A hernia is present. Hernia is present in the left inguinal area.  Genitourinary:    Comments: Left inguinal hernia Musculoskeletal:        General: No swelling, tenderness, deformity or signs of injury.     Right lower leg: No edema.     Left lower leg: No edema.  Skin:    General: Skin is warm and  dry.     Capillary Refill: Capillary refill takes less than 2 seconds.     Coloration: Skin is not jaundiced or pale.     Findings: No bruising, erythema or lesion.  Neurological:     Mental Status: He is alert and oriented to person, place, and time.     Motor: No weakness.     Coordination: Coordination normal.     Gait: Gait normal.  Psychiatric:        Mood and Affect: Mood normal.        Behavior: Behavior normal.        Thought Content: Thought content normal.        Judgment: Judgment normal.     Assessment & Plan:   Problem List Items Addressed This Visit       Digestive   GERD (gastroesophageal reflux disease)   On pantoprazole 40 mg twice daily Continue current medication      Relevant Medications   pantoprazole (PROTONIX) 40 MG tablet     Other   Heart transplant status (HCC)   Patient encouraged to maintain close follow-up with the transplant team at [facility] health Continue Prograf 2 mg twice daily, prednisone 5 mg daily, aspirin 81 mg daily      Relevant Medications   predniSONE (DELTASONE) 5 MG tablet   Hyperlipidemia   Continue pravastatin 5 mg daily      Anxiety   On clonazepam 1 mg twice daily as needed, Celexa 30 mg daily Medication managed by psychiatrist Continue current medication and maintain close follow-up with psychiatrist      Inguinal hernia, left - Primary   1. Inguinal hernia, left (Primary)  - Ambulatory referral to General Surgery -  US Abdomen Limited; Future Educational material on inguinal hernia provided and reviewed with the patient      Relevant Orders   Ambulatory referral to General Surgery   US Abdomen Limited    Outpatient Encounter Medications as of 12/13/2023  Medication Sig   acetaminophen (TYLENOL) 500 MG tablet Take 500-1,000 mg by mouth as needed.   aspirin EC 81 MG tablet Take 1 tablet by mouth daily.   Calcium Carb-Cholecalciferol 500-15 MG-MCG TABS Take 1 tablet by mouth daily.   cetirizine (ZYRTEC ALLERGY) 10 MG tablet Take 1 tablet (10 mg total) by mouth daily.   citalopram (CELEXA) 20 MG tablet Take 30 mg by mouth at bedtime.    clonazePAM (KLONOPIN) 0.5 MG tablet Take 1 mg by mouth 2 (two) times daily as needed for anxiety.   fluticasone (FLONASE) 50 MCG/ACT nasal spray Place 1 spray into both nostrils daily.   magnesium oxide (MAG-OX) 400 MG tablet Take 400 mg by mouth daily.   Multiple Vitamin (MULTIVITAMIN WITH MINERALS) TABS tablet Take 1 tablet by mouth daily.   pantoprazole (PROTONIX) 40 MG tablet Take 40 mg by mouth 2 (two) times daily.   pravastatin (PRAVACHOL) 20 MG tablet Take 20 mg by mouth at bedtime.   predniSONE (DELTASONE) 5 MG tablet Take 5 mg by mouth daily with breakfast.   tacrolimus (PROGRAF) 1 MG capsule Take 2 mg by mouth 2 (two) times daily.    [DISCONTINUED] omeprazole (PRILOSEC) 40 MG capsule Take 1 capsule (40 mg total) by mouth daily.   albuterol (VENTOLIN HFA) 108 (90 Base) MCG/ACT inhaler Inhale 1-2 puffs into the lungs every 6 (six) hours as needed for wheezing or shortness of breath (cough). (Patient not taking: Reported on 12/13/2023)   dicyclomine (BENTYL) 10 MG capsule Take  1 capsule (10 mg total) by mouth every 6 (six) hours as needed for spasms (Abdominal pain). (Patient not taking: Reported on 12/13/2023)   FIBER ADULT GUMMIES PO Take 1-2 tablets by mouth daily.   glycopyrrolate (ROBINUL) 1 MG tablet TAKE 1 TABLET BY MOUTH TWICE A DAY (Patient not taking:  Reported on 12/13/2023)   mycophenolate (CELLCEPT) 500 MG tablet Take 500 mg by mouth. 3 tablets twice a day (Patient not taking: Reported on 12/13/2023)   ondansetron (ZOFRAN) 4 MG tablet Take 1 tablet (4 mg total) by mouth every 6 (six) hours as needed for nausea or vomiting. (Patient not taking: Reported on 12/13/2023)   [DISCONTINUED] amoxicillin-clavulanate (AUGMENTIN) 875-125 MG tablet Take 1 tablet by mouth every 12 (twelve) hours. (Patient not taking: Reported on 12/13/2023)   [DISCONTINUED] azithromycin (ZITHROMAX) 250 MG tablet Take 1 tablet (250 mg total) by mouth daily. Take first 2 tablets together, then 1 every day until finished. (Patient not taking: Reported on 12/13/2023)   [DISCONTINUED] cefpodoxime (VANTIN) 200 MG tablet Take 1 tablet (200 mg total) by mouth 2 (two) times daily. (Patient not taking: Reported on 12/13/2023)   [DISCONTINUED] predniSONE (STERAPRED UNI-PAK 21 TAB) 10 MG (21) TBPK tablet Take as directed (Patient not taking: Reported on 12/13/2023)   No facility-administered encounter medications on file as of 12/13/2023.    Follow-up: Return in about 6 months (around 06/12/2024) for ANXIETY, DEPRESSION, CPE.   Donell Beers, FNP

## 2023-12-13 NOTE — Assessment & Plan Note (Signed)
Continue pravastatin 5 mg daily

## 2023-12-13 NOTE — Assessment & Plan Note (Signed)
Patient encouraged to maintain close follow-up with the transplant team at [facility] health Continue Prograf 2 mg twice daily, prednisone 5 mg daily, aspirin 81 mg daily

## 2023-12-14 ENCOUNTER — Ambulatory Visit
Admission: RE | Admit: 2023-12-14 | Discharge: 2023-12-14 | Disposition: A | Discharge status: Home / Self Care | Payer: Managed Care, Other (non HMO) | Source: Ambulatory Visit | Attending: Nurse Practitioner

## 2023-12-14 DIAGNOSIS — K4090 Unilateral inguinal hernia, without obstruction or gangrene, not specified as recurrent: Secondary | ICD-10-CM

## 2023-12-16 ENCOUNTER — Telehealth: Payer: Self-pay

## 2023-12-16 NOTE — Telephone Encounter (Signed)
 Copied from CRM [id number]. Topic: General - Other >> Dec 14, 2023 10:46 AM CMA Rosary Lively wrote: Patient DOB or MRN? Multiple in epic

## 2023-12-16 NOTE — Telephone Encounter (Signed)
 Copied from CRM [id number]. Topic: Clinical - Lab/Test Results >> Dec 16, 2023  1:27 PM Dennison Nancy wrote: Reason for [id number] requesting  results for ultrasound on left side lower abdomen that was on done on 12/14/23

## 2023-12-16 NOTE — Telephone Encounter (Signed)
 See note

## 2023-12-17 DIAGNOSIS — Z4821 Encounter for aftercare following heart transplant: Secondary | ICD-10-CM | POA: Diagnosis not present

## 2023-12-17 DIAGNOSIS — R918 Other nonspecific abnormal finding of lung field: Secondary | ICD-10-CM | POA: Diagnosis not present

## 2023-12-17 DIAGNOSIS — Z2989 Encounter for other specified prophylactic measures: Secondary | ICD-10-CM | POA: Diagnosis not present

## 2023-12-17 DIAGNOSIS — Z79899 Other long term (current) drug therapy: Secondary | ICD-10-CM | POA: Diagnosis not present

## 2023-12-17 DIAGNOSIS — Z125 Encounter for screening for malignant neoplasm of prostate: Secondary | ICD-10-CM | POA: Diagnosis not present

## 2023-12-24 ENCOUNTER — Ambulatory Visit: Payer: Self-pay | Admitting: Surgery

## 2023-12-27 NOTE — H&P (Signed)
 History of Present Illness: Shawn Calderon is a 48 y.o. male who is seen today as an office consultation for evaluation of New Consultation (HERNIA)  Patient presents for evaluation of symptomatic left inguinal hernia. He has had pain for a couple of months. It is more noticeable when he strains. It does pop and pop out. He is a very active person playing a lot of different sports and would like him to work out. 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 He is on minimal immunosuppression and is followed by cardiology with good cardiac function. He is still very active playing pickle ball and working out.  Review of Systems: A complete review of systems was obtained from the patient. I have reviewed this information and discussed as appropriate with the patient. See HPI as well for other ROS.    Medical History: Past Medical History:  Diagnosis Date  Hyperlipidemia   Patient Active Problem List  Diagnosis  Heart replaced by transplant 05/08/2007  Hypertension  OCD (obsessive compulsive disorder)  Hyperlipidemia  Walking pneumonia  Vasculopathy of cardiac allograft (CMS/HHS-HCC)  Stage 3a chronic kidney disease (CMS/HHS-HCC)  Lung infection  Junctional bradycardia  Sinus bradycardia  Anxiety   Past Surgical History:  Procedure Laterality Date  BRONCHOSCOPY Bilateral 11/05/2023  Procedure: BRONCHOSCOPY, RIGID OR FLEXIBLE, INCLUDING FLUOROSCOPIC GUIDANCE, WHEN PERFORMED; WITH BRONCHIAL ALVEOLAR LAVAGE; Surgeon: Malissa Albino Manor, MD; Location: [facility]; Service: Pulmonary; Laterality: Bilateral;  TRANSPLANT HEART    Allergies  Allergen Reactions  Sulfa (Sulfonamide Antibiotics) Rash  Amoxicillin -Pot Clavulanate Diarrhea  Clindamycin Other (See Comments)  Heartburn Heartburn  Sirolimus Other (See Comments)  GI distress   Current Outpatient Medications on File Prior to Visit  Medication Sig Dispense Refill  aspirin 81 MG EC  tablet Take 81 mg by mouth daily.  calcium  carbonate-vitamin D3 (OS-CAL 500 + D) 500 mg(1,250mg ) -500 unit Tab 1 tab by mouth 2 times a day 11  citalopram  (CELEXA ) 40 MG tablet Take 40 mg by mouth once daily  clonazePAM  (KLONOPIN ) 0.5 MG tablet TAKE 2 TABLETS BY MOUTH TWO TIMES A DAY AS NEEDED FOR ANXIETY (Patient taking differently: Take 0.5 mg by mouth 2 (two) times daily) 120 tablet 5  digestive enzymes Cap Take 1 capsule by mouth once daily Ora Organic Digestive Enzymes  L.acid/B.animalis,bifidum/FOS (PROBIOTIC COMPLEX ORAL) Take 2 capsules by mouth once daily O[employer] Organic Trust your gut probiotic  magnesium  oxide (MAG-OX) 400 mg tablet 1 tab by mouth daily 11  multivitamin tablet Take 1 tablet by mouth once daily 30 tablet 11  pantoprazole (PROTONIX) 40 MG DR tablet Take 1 tablet (40 mg total) by mouth 2 (two) times daily 180 tablet 3  predniSONE  (DELTASONE ) 5 MG tablet Take 1 tablet (5 mg total) by mouth once daily 30 tablet 2  rosuvastatin  (CRESTOR ) 40 MG tablet Take 1 tablet (40 mg total) by mouth once daily 90 tablet 3  tacrolimus  (PROGRAF ) 1 MG capsule Take 2 capsules (2 mg total) by mouth every 12 (twelve) hours 360 capsule 3  tadalafiL (CIALIS) 5 MG tablet Take 5 mg by mouth once daily Takes for BPH  topiramate (TOPAMAX) 100 MG tablet Take 100 mg by mouth once daily   No current facility-administered medications on file prior to visit.   History reviewed. No pertinent family history.   Social History   Tobacco Use  Smoking Status Never  Smokeless Tobacco Never    Social History   Socioeconomic History  Marital status:  Married  Number of children: 1  Tobacco Use  Smoking status: Never  Smokeless tobacco: Never  Vaping Use  Vaping status: Never Used  Substance and Sexual Activity  Drug use: Never  Sexual activity: Defer   Social Drivers of Health   Financial Resource Strain: Low Risk (12/13/2023)  Received from [facility]  Overall Financial Resource Strain  (CARDIA)  Difficulty of Paying Living Expenses: Not hard at all  Food Insecurity: No Food Insecurity (12/13/2023)  Received from [facility]  Hunger Vital Sign  Worried About Running Out of Food in the Last Year: Never true  Ran Out of Food in the Last Year: Never true  Transportation Needs: No Transportation Needs (12/13/2023)  Received from [facility] - Transportation  Lack of Transportation (Medical): No  Lack of Transportation (Non-Medical): No  Physical Activity: Sufficiently Active (12/13/2023)  Received from [facility]  Exercise Vital Sign  Days of Exercise per Week: 3 days  Minutes of Exercise per Session: 60 min  Stress: No Stress Concern Present (12/13/2023)  Received from [facility] - Occupational Stress Questionnaire  Feeling of Stress : Not at all  Social Connections: Unknown (12/13/2023)  Received from [facility]  Social Connection and Isolation Panel [NHANES]  Frequency of Communication with Friends and Family: Three times a week  Frequency of Social Gatherings with Friends and Family: Once a week  Attends Religious Services: Patient declined  Active Member of Clubs or Organizations: No  Marital Status: Married  Housing Stability: Unknown (12/24/2023)  Housing Stability Vital Sign  Unable to Pay for Housing in the Last Year: No  Homeless in the Last Year: No   Objective:   Vitals:  12/24/23 0853  BP: 120/82  Pulse: 75  Temp: 36.9 C (98.4 F)  SpO2: 98%  Weight: 85.2 kg (187 lb 12.8 oz)  Height: 177.8 cm (5' 10)  PainSc: 0-No pain   Body mass index is 26.95 kg/m.  Physical Exam HENT:  Head: Normocephalic.  Cardiovascular:  Rate and Rhythm: Normal rate.  Pulmonary:  Effort: Pulmonary effort is normal.  Abdominal:  Tenderness: There is no abdominal tenderness.  Hernia: A hernia is present. Hernia is present in the left inguinal area. There is no hernia in the right inguinal area.    Comments: Reducible left inguinal hernia. Mild tenderness to palpation. No evidence of right inguinal hernia exam today.  Musculoskeletal:  General: Normal range of motion.  Skin: General: Skin is warm.  Neurological:  General: No focal deficit present.  Mental Status: He is alert.  Psychiatric:  Mood and Affect: Mood normal.     Assessment and Plan:   Diagnoses and all orders for this visit:  Left inguinal hernia   Discussed laparoscopic and open techniques of repair with mesh. He would like to proceed with open repair of his left inguinal hernia. We discussed the use of mesh. We discussed potential complications as well. Risk of bleeding, infection, recurrence, organ injury, nerve injury, blood vessel injury, injury to the testicle, the need further treatment and procedures reviewed today.   DEBBY CURTISTINE SHIPPER, MD

## 2023-12-29 ENCOUNTER — Encounter (HOSPITAL_COMMUNITY): Payer: Self-pay

## 2023-12-29 NOTE — Patient Instructions (Addendum)
SURGICAL WAITING ROOM VISITATION Patients having surgery or a procedure may have no more than 2 support people in the waiting area - these visitors may rotate.    Children under the age of 33 must have an adult with them who is not the patient.  Due to an increase in RSV and influenza rates and associated hospitalizations, children ages 60 and under may not visit patients in [facility] hospitals.   If the patient needs to stay at the hospital during part of their recovery, the visitor guidelines for inpatient rooms apply. Pre-op nurse will coordinate an appropriate time for 1 support person to accompany patient in pre-op.  This support person may not rotate.    Please refer to the [facility] website for the visitor guidelines for Inpatients (after your surgery is over and you are in a regular room).       Your procedure is scheduled on: 01-03-24   Report to [facility] Main Entrance    Report to admitting at 5:15 AM   Call this number if you have problems the morning of surgery [phone]   Do not eat food :After Midnight.   After Midnight you may have the following liquids until 4:30 AM DAY OF SURGERY  Water Non-Citrus Juices (without pulp, NO RED-Apple, White grape, White cranberry) Black Coffee (NO MILK/CREAM OR CREAMERS, sugar ok)  Clear Tea (NO MILK/CREAM OR CREAMERS, sugar ok) regular and decaf                             Plain Jell-O (NO RED)                                           Fruit ices (not with fruit pulp, NO RED)                                     Popsicles (NO RED)                                                               Sports drinks like Gatorade (NO RED)                     If you have questions, please contact your surgeon's office.   FOLLOW BANY ADDITIONAL PRE OP INSTRUCTIONS YOU RECEIVED FROM YOUR SURGEON'S OFFICE!!!     Oral Hygiene is also important to reduce your risk of infection.                                    Remember -  BRUSH YOUR TEETH THE MORNING OF SURGERY WITH YOUR REGULAR TOOTHPASTE   Do NOT smoke after Midnight   Take these medicines the morning of surgery with A SIP OF WATER:   Citalopram  Clonazepam  Pantoprazole  Rosuvastatin  Tacrolimus  Topiramate  Zyrtec  Tylenol if needed  Stop all vitamins and herbal supplements 7 days before surgery  You may not have any metal on your body including jewelry, and body piercing             Do not wear lotions, powders, cologne, or deodorant              Men may shave face and neck.   Do not bring valuables to the hospital.  IS NOT RESPONSIBLE   FOR VALUABLES.   Contacts, dentures or bridgework may not be worn into surgery.  DO NOT BRING YOUR HOME MEDICATIONS TO THE HOSPITAL. PHARMACY WILL DISPENSE MEDICATIONS LISTED ON YOUR MEDICATION LIST TO YOU DURING YOUR ADMISSION IN THE HOSPITAL!    Patients discharged on the day of surgery will not be allowed to drive home.  Someone NEEDS to stay with you for the first 24 hours after anesthesia.   Special Instructions: Bring a copy of your healthcare power of attorney and living will documents the day of surgery if you haven't scanned them before.              Please read over the following fact sheets you were given: IF YOU HAVE QUESTIONS ABOUT YOUR PRE-OP INSTRUCTIONS PLEASE CALL [phone] Gwen  If you received a COVID test during your pre-op visit  it is requested that you wear a mask when out in public, stay away from anyone that may not be feeling well and notify your surgeon if you develop symptoms. If you test positive for Covid or have been in contact with anyone that has tested positive in the last 10 days please notify you surgeon.   - Preparing for Surgery Before surgery, you can play an important role.  Because skin is not sterile, your skin needs to be as free of germs as possible.  You can reduce the number of germs on your skin by washing  with CHG (chlorahexidine gluconate) soap before surgery.  CHG is an antiseptic cleaner which kills germs and bonds with the skin to continue killing germs even after washing. Please DO NOT use if you have an allergy to CHG or antibacterial soaps.  If your skin becomes reddened/irritated stop using the CHG and inform your nurse when you arrive at Short Stay. Do not shave (including legs and underarms) for at least 48 hours prior to the first CHG shower.  You may shave your face/neck.  Please follow these instructions carefully:  1.  Shower with CHG Soap the night before surgery and the  morning of surgery.  2.  If you choose to wash your hair, wash your hair first as usual with your normal  shampoo.  3.  After you shampoo, rinse your hair and body thoroughly to remove the shampoo.                             4.  Use CHG as you would any other liquid soap.  You can apply chg directly to the skin and wash.  Gently with a scrungie or clean washcloth.  5.  Apply the CHG Soap to your body ONLY FROM THE NECK DOWN.   Do   not use on face/ open                           Wound or open sores. Avoid contact with eyes, ears mouth and   genitals (private parts).  Wash face,  Genitals (private parts) with your normal soap.             6.  Wash thoroughly, paying special attention to the area where your    surgery  will be performed.  7.  Thoroughly rinse your body with warm water from the neck down.  8.  DO NOT shower/wash with your normal soap after using and rinsing off the CHG Soap.                9.  Pat yourself dry with a clean towel.            10.  Wear clean pajamas.            11.  Place clean sheets on your bed the night of your first shower and do not  sleep with pets. Day of Surgery : Do not apply any lotions/deodorants the morning of surgery.  Please wear clean clothes to the hospital/surgery center.  FAILURE TO FOLLOW THESE INSTRUCTIONS MAY RESULT IN THE CANCELLATION OF YOUR  SURGERY  PATIENT SIGNATURE_________________________________  NURSE SIGNATURE__________________________________  ________________________________________________________________________

## 2023-12-29 NOTE — Progress Notes (Addendum)
COVID Vaccine Completed:  Yes  Date of COVID positive in last 90 days:  No  PCP - Edwin Dada, FNP Cardiologist - [facility] team  Chest x-ray - 10-27-23 Epic EKG - 11-03-23 [facility], copy on chart Stress Test - 09-13-17 [facility] ECHO - 11-04-23 [facility] Cardiac Cath - 10-20-23 [facility] Pacemaker/ICD device last checked: Spinal Cord Stimulator: N/A  Bowel Prep - N/A  Sleep Study - N/A CPAP -   Fasting Blood Sugar - N/A Checks Blood Sugar _____ times a day  Last dose of GLP1 agonist-  N/A GLP1 instructions:  Hold 7 days before surgery    Last dose of SGLT-2 inhibitors-  N/A SGLT-2 instructions:  Hold 3 days before surgery    Blood Thinner Instructions:  Time Aspirin Instructions:  ASA 81, patient will check to see if he needs to stop Last Dose:  Activity level:  Can go up a flight of stairs and perform activities of daily living without stopping and without symptoms of chest pain or shortness of breath.  Able to exercise without symptoms, patient exercises daily.  Anesthesia review:  CHF, hx of heart transplant for CAV, RBBB.  Pneumonia in November 2024.  Pneumonia November 2024, patient states that all symptoms resolved.  Patient denies shortness of breath, fever, cough and chest pain at PAT appointment  Patient verbalized understanding of instructions that were given to them at the PAT appointment. Patient was also instructed that they will need to review over the PAT instructions again at home before surgery.

## 2023-12-30 ENCOUNTER — Other Ambulatory Visit: Payer: Self-pay

## 2023-12-30 ENCOUNTER — Encounter (HOSPITAL_COMMUNITY): Payer: Self-pay

## 2023-12-30 ENCOUNTER — Encounter (HOSPITAL_COMMUNITY)
Admission: RE | Admit: 2023-12-30 | Discharge: 2023-12-30 | Disposition: A | Discharge status: Home / Self Care | Payer: BC Managed Care – PPO | Source: Ambulatory Visit | Attending: Surgery | Admitting: Surgery

## 2023-12-30 VITALS — BP 129/84 | HR 76 | Temp 98.3°F | Resp 20 | Ht 70.0 in | Wt 182.8 lb

## 2023-12-30 DIAGNOSIS — I130 Hypertensive heart and chronic kidney disease with heart failure and stage 1 through stage 4 chronic kidney disease, or unspecified chronic kidney disease: Secondary | ICD-10-CM | POA: Diagnosis not present

## 2023-12-30 DIAGNOSIS — Z01818 Encounter for other preprocedural examination: Secondary | ICD-10-CM

## 2023-12-30 DIAGNOSIS — I509 Heart failure, unspecified: Secondary | ICD-10-CM | POA: Insufficient documentation

## 2023-12-30 DIAGNOSIS — Z7982 Long term (current) use of aspirin: Secondary | ICD-10-CM | POA: Insufficient documentation

## 2023-12-30 DIAGNOSIS — Z941 Heart transplant status: Secondary | ICD-10-CM | POA: Insufficient documentation

## 2023-12-30 DIAGNOSIS — Z79621 Long term (current) use of calcineurin inhibitor: Secondary | ICD-10-CM | POA: Diagnosis not present

## 2023-12-30 DIAGNOSIS — K4090 Unilateral inguinal hernia, without obstruction or gangrene, not specified as recurrent: Secondary | ICD-10-CM | POA: Insufficient documentation

## 2023-12-30 DIAGNOSIS — Z01812 Encounter for preprocedural laboratory examination: Secondary | ICD-10-CM | POA: Diagnosis present

## 2023-12-30 DIAGNOSIS — N189 Chronic kidney disease, unspecified: Secondary | ICD-10-CM | POA: Diagnosis not present

## 2023-12-30 DIAGNOSIS — I2510 Atherosclerotic heart disease of native coronary artery without angina pectoris: Secondary | ICD-10-CM | POA: Diagnosis not present

## 2023-12-30 HISTORY — DX: Pneumonia, unspecified organism: J18.9

## 2023-12-30 HISTORY — DX: Chronic kidney disease, unspecified: N18.9

## 2023-12-30 HISTORY — DX: Gastro-esophageal reflux disease without esophagitis: K21.9

## 2023-12-30 HISTORY — DX: Methicillin resistant Staphylococcus aureus infection, unspecified site: A49.02

## 2023-12-30 HISTORY — DX: Essential (primary) hypertension: I10

## 2023-12-30 LAB — CBC
HCT: 41.5 % (ref 39.0–52.0)
Hemoglobin: 13.5 g/dL (ref 13.0–17.0)
MCH: 28.5 pg (ref 26.0–34.0)
MCHC: 32.5 g/dL (ref 30.0–36.0)
MCV: 87.6 fL (ref 80.0–100.0)
Platelets: 180 10*3/uL (ref 150–400)
RBC: 4.74 MIL/uL (ref 4.22–5.81)
RDW: 14.3 % (ref 11.5–15.5)
WBC: 8.9 10*3/uL (ref 4.0–10.5)
nRBC: 0 % (ref 0.0–0.2)

## 2023-12-30 LAB — BASIC METABOLIC PANEL
Anion gap: 10 (ref 5–15)
BUN: 27 mg/dL — ABNORMAL HIGH (ref 6–20)
CO2: 21 mmol/L — ABNORMAL LOW (ref 22–32)
Calcium: 9.5 mg/dL (ref 8.9–10.3)
Chloride: 110 mmol/L (ref 98–111)
Creatinine, Ser: 1.16 mg/dL (ref 0.61–1.24)
GFR, Estimated: >60 mL/min (ref >60)
Glucose, Bld: 99 mg/dL (ref 70–99)
Potassium: 3.9 mmol/L (ref 3.5–5.1)
Sodium: 141 mmol/L (ref 135–145)

## 2023-12-30 LAB — SURGICAL PCR SCREEN
MRSA, PCR: NEGATIVE
Staphylococcus aureus: NEGATIVE

## 2023-12-31 NOTE — Anesthesia Preprocedure Evaluation (Signed)
Anesthesia Evaluation  Patient identified by MRN, date of birth, ID band Patient awake    Reviewed: Allergy & Precautions, NPO status , Patient's Chart, lab work & pertinent test results  Airway Mallampati: II  TM Distance: >3 FB Neck ROM: Full  Mouth opening: Limited Mouth Opening  Dental no notable dental hx. (+) Teeth Intact, Dental Advisory Given   Pulmonary    Pulmonary exam normal breath sounds clear to auscultation       Cardiovascular hypertension, +CHF  Normal cardiovascular exam+ Valvular Problems/Murmurs AI  Rhythm:Regular Rate:Normal  S/p heart transplant   Neuro/Psych  PSYCHIATRIC DISORDERS Anxiety        GI/Hepatic ,GERD  ,,  Endo/Other    Renal/GU Renal InsufficiencyRenal diseaseLab Results      Component                Value               Date                                K                        3.9                 12/30/2023                CO2                      21 (L)              12/30/2023                BUN                      27 (H)              12/30/2023                CREATININE               1.16                12/30/2023                        Musculoskeletal   Abdominal   Peds  Hematology Lab Results      Component                Value               Date                      WBC                      8.9                 12/30/2023                HGB                      13.5                12/30/2023                HCT  41.5                12/30/2023                MCV                      87.6                12/30/2023                PLT                      180                 12/30/2023              Anesthesia Other Findings ALL: see list  Reproductive/Obstetrics                             Anesthesia Physical Anesthesia Plan  ASA: 3  Anesthesia Plan: General   Post-op Pain Management: Tylenol PO (pre-op)*   Induction:  Intravenous  PONV Risk Score and Plan: 3 and Treatment may vary due to age or medical condition, Midazolam and Ondansetron  Airway Management Planned: Oral ETT and LMA  Additional Equipment: None  Intra-op Plan:   Post-operative Plan: Extubation in OR  Informed Consent: I have reviewed the patients History and Physical, chart, labs and discussed the procedure including the risks, benefits and alternatives for the proposed anesthesia with the patient or authorized representative who has indicated his/her understanding and acceptance.     Dental advisory given  Plan Discussed with: CRNA and Anesthesiologist  Anesthesia Plan Comments: (See PAT note 12/30/2023)       Anesthesia Quick Evaluation

## 2023-12-31 NOTE — Progress Notes (Signed)
Spoke with [facility] at Dr. Rosezena Sensor office and requested copy of cardiac clearance.  Jeanice Lim stated that she would fax over.

## 2023-12-31 NOTE — Progress Notes (Signed)
Anesthesia Chart Review   Case: [id number] Date/Time: 01/03/24 0715   Procedure: REPAIR LEFT INGUINAL HERNIA WITH MESH (Left) - GENERAL & TAP BLOCK   Anesthesia type: General   Pre-op diagnosis: Left Inguinal Hernia   Location: [facility] ROOM 04 / [facility]   Surgeons: Harriette Bouillon, MD       DISCUSSION:48 y.o. never smoker with h/o HTN, CHF, s/p heart transplant, CKD, left inguinal hernia scheduled for above procedure 01/03/2024 with Dr. Harriette Bouillon.   Pt s/o heart transplant 2008, follows with [facility] transplant.  Last seen 12/17/23. Per OV note pt doing well, remains active  playing sports with no exertional symptoms. "He seems to be doing well is known to have a progression of his CAV when comparing his coronary angiogram from 2023-2024. No heart failure symptoms. His LVEF is preserved on recent echo and RHC with normal filling pressures"  Per cardiology preoperative evaluation, "Mr. Hariri was last seen by [facility] on 12/17/2023. An echocardiogram performed on 11/04/2023 revealed normal left ventricular dysfunction with an ejection fraction of >55%. Mr. Zero does have vasculopathy as found per prior cardiac catheterization. His baseline kidney function is decreased but stable with a Cr around 1.3. His immunosuppression regimen includes Tacrolimus.  Mr. Kitchens is high risk for any cardiac events related to hernia surgery and is cleared to undergo this procedure. He should remain on his normal immunosuppression regimen before and after the procedure. He is on 81 mg Aspirin daily, and this can be held for one week prior to surgery. If you have any questions, please feel free to contact the office at [phone] or fax, [phone]."  VS: BP 129/84   Pulse 76   Temp 36.8 C (Oral)   Resp 20   Ht 5\' 10"  (1.778 m)   Wt 82.9 kg   SpO2 96%   BMI 26.23 kg/m   PROVIDERS: Donell Beers, FNP is PCP    LABS: Labs reviewed: Acceptable for surgery. (all labs ordered are listed, but only  abnormal results are displayed)  Labs Reviewed  BASIC METABOLIC PANEL - Abnormal; Notable for the following components:      Result Value   CO2 21 (*)    BUN 27 (*)    All other components within normal limits  SURGICAL PCR SCREEN  CBC     IMAGES:   EKG:   CV: Echo 11/04/23 CONCLUSION -------------------------------------------------------------------------------  NORMAL LEFT VENTRICULAR SYSTOLIC FUNCTION WITH MODERATE LVH  ESTIMATED EF: >55%, CALC EF(3D): 55%  DIASTOLIC FUNCTION CAN'T BE DETERMINED  NORMAL RIGHT VENTRICULAR SYSTOLIC FUNCTION  VALVULAR REGURGITATION: MILD AR, TRIVIAL MR, MILD PR, TRIVIAL TR  NO VALVULAR STENOSIS  Past Medical History:  Diagnosis Date   Anxiety    CHF (congestive heart failure) (HCC)    Chronic kidney disease    GERD (gastroesophageal reflux disease)    Heart transplant recipient [facility]) 2008   Hypertension    MRSA infection    L arm   Pneumonia     Past Surgical History:  Procedure Laterality Date   APPENDECTOMY     COLONOSCOPY     heart transplant  2008   heart transplant recipient   HEART TRANSPLANT      MEDICATIONS:  acetaminophen (TYLENOL) 500 MG tablet   aspirin EC 81 MG tablet   Calcium Carbonate-Vitamin D (OSCAL 500 D-3 PO)   cetirizine (ZYRTEC ALLERGY) 10 MG tablet   citalopram (CELEXA) 40 MG tablet   clonazePAM (KLONOPIN) 0.5 MG tablet   magnesium  oxide (MAG-OX) 400 MG tablet   Multiple Vitamin (MULTIVITAMIN WITH MINERALS) TABS tablet   pantoprazole (PROTONIX) 40 MG tablet   predniSONE (DELTASONE) 5 MG tablet   rosuvastatin (CRESTOR) 40 MG tablet   tacrolimus (PROGRAF) 1 MG capsule   tadalafil (CIALIS) 5 MG tablet   topiramate (TOPAMAX) 100 MG tablet   No current facility-administered medications for this encounter.    Jodell Cipro Ward, PA-C [facility] Pre-Surgical Testing [phone]

## 2024-01-02 NOTE — H&P (Signed)
[facility] System Outside Information Initial consult 12/24/2023 [facility] Surgery SHONDALE Calderon - 48 y.o. Male; born [birth year] Encounter Summary , generated on Jan. 13, 2025January 13, 2025  Reason for Visit  Reason for Visit - Reason Comments  New Consultation HERNIA   Encounter Details  Encounter Details Date Type Department Care Team (Latest Contact Info) Description  12/24/2023 9:20 AM EST Initial consult [facility]  [address]  [address]  [place], [place] [address]  [phone]  Georjean Toya, Sharl Ma, MD  [address]  [address]  [place], [place] [address]  [phone] (Work)  [phone] (Fax)  Left inguinal hernia (Primary Dx)   Social History - documented as of this encounter  Social History Tobacco Use Types Packs/Day Years Used Date  Smoking Tobacco: Never          Smokeless Tobacco: Never           Social History Alcohol Use Standard Drinks/Week Comments  Not Asked 0 (1 standard drink = 0.6 oz pure alcohol)     Social History A[employer] Utilities Answer Date Recorded  In the past 12 months has the electric, gas, oil, or water company threatened to shut off services in your home? No 11/04/2023   Social History Overall Physicist, medical Strain (CARDIA) Answer Date Recorded  How hard is it for you to pay for the very basics like food, housing, medical care, and heating? Not hard at all 11/04/2023   Social History Hunger Vital Sign Answer Date Recorded  Worried About Running Out of Food in the Last Year Not on file 11/04/2023  Within the past 12 months, the food you bought just didn't last and you didn't have money to get more. Never true 11/04/2023   Social History PRAPARE - Transportation Answer Date Recorded  In the past 12 months, has lack of transportation kept you from medical appointments or from getting medications? No 11/05/2023  In the past 12 months, has lack of transportation kept  you from meetings, work, or from getting things needed for daily living? No 11/05/2023   Social History Housing Stability Vital Sign Answer Date Recorded  In the last 12 months, was there a time when you were not able to pay the mortgage or rent on time? No 12/24/2023  Number of Times Moved in the Last Year Not on file 12/24/2023  At any time in the past 12 months, were you homeless or living in a shelter (including now)? No 12/24/2023   Social History Interpersonal Safety Answer Date Recorded  Is Anyone Hurting/Threatening You or Making You Feel Afraid? No 11/05/2023  Is Anyone Hurting/Threatening You or Making You Feel Afraid? No 11/05/2023   Social History Sex and Gender Information Value Date Recorded  Sex Assigned at Birth Not on file    Legal Sex Male 02/10/2014 12:10 PM EST  Gender Identity Not on file    Sexual Orientation Not on file     Last Filed Vital Signs - documented in this encounter  Last Filed Vital Signs Vital Sign Reading Time Taken Comments  Blood Pressure 120/82 12/24/2023 8:53 AM EST    Pulse 75 12/24/2023 8:53 AM EST    Temperature 36.9 C (98.4 F) 12/24/2023 8:53 AM EST    Respiratory Rate - -    Oxygen Saturation 98% 12/24/2023 8:53 AM EST    Inhaled Oxygen Concentration - -    Weight 85.2 kg (187 lb 12.8 oz) 12/24/2023 8:53 AM  EST    Height 177.8 cm (5\' 10" ) 12/24/2023 8:53 AM EST    Body Mass Index 26.95 12/24/2023 8:53 AM EST     Functional Status - documented as of this encounter  Are you deaf or do you have serious difficulty hearing? Functional Status - Are you deaf or do you have serious difficulty hearing? Answer Date of Assessment Author  No 11/04/2023 12:53 AM EST Tamera Punt, RN     Are you blind or do you have serious difficulty seeing, even when wearing glasses? Functional Status - Are you blind or do you have serious difficulty seeing, even when wearing glasses? Answer Date of Assessment Author  No 11/04/2023 12:53 AM  EST Tamera Punt, RN     Do you have serious difficulty walking or climbing stairs? (67 years old or older) Functional Status - Do you have serious difficulty walking or climbing stairs? (59 years old or older) Answer Date of Assessment Author  No 11/04/2023 12:53 AM EST Tamera Punt, RN     Because of a physical, mental, or emotional condition, do you have difficulty doing errands alone such as visiting a doctor's office or shopping? (57 years old or older) Functional Status - Because of a physical, mental, or emotional condition, do you have difficulty doing errands alone such as visiting a doctor's office or shopping? (28 years old or older) Answer Date of Assessment Author  No 11/04/2023 12:53 AM EST Tamera Punt, RN   Mental Status - documented in this encounter  Because of a physical, mental, or emotional condition, do you have serious difficulty concentrating, remembering, or making decisions? (53 years old or older) Mental Status - Because of a physical, mental, or emotional condition, do you have serious difficulty concentrating, remembering, or making decisions? (39 years old or older) Answer Entry Date Author  No 11/04/2023 12:53 AM EST Tamera Punt, RN   Patient Instructions - documented in this encounter  Attachments The following attachments cannot be sent through Care Everywhere.  Inguinal Hernia (English) Progress Notes - documented in this encounter  Tashawn Laswell, Sharl Ma, MD - 12/24/2023 9:20 AM EST Formatting of this note is different from the original. Images from the original note were not included.   REFERRING PHYSICIAN: Donell Beers, FNP PROVIDER: Hayden Rasmussen, MD MRN: [id number] DOB: [birth year] DATE OF ENCOUNTER: 12/24/2023 Subjective  Chief Complaint: New Consultation (HERNIA)  History of Present Illness: Shawn Calderon is a 48 y.o. male who is seen today as an office consultation for  evaluation of New Consultation (HERNIA)  Patient presents for evaluation of symptomatic left inguinal hernia. He has had pain for a couple of months. It is more noticeable when he strains. It does pop and pop out. He is a very active person playing a lot of different sports and would like him to work out. 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 He is on minimal immunosuppression and is followed by cardiology with good cardiac function. He is still very active playing pickle ball and working out.  Review of Systems: A complete review of systems was obtained from the patient. I have reviewed this information and discussed as appropriate with the patient. See HPI as well for other ROS.    Medical History: Past Medical History: Diagnosis Date Hyperlipidemia  Patient Active Problem List Diagnosis Heart replaced by transplant 05/08/2007 Hypertension OCD (obsessive compulsive disorder) Hyperlipidemia Walking pneumonia Vasculopathy of cardiac allograft (CMS/HHS-HCC) Stage 3a  chronic kidney disease (CMS/HHS-HCC) Lung infection Junctional bradycardia Sinus bradycardia Anxiety  Past Surgical History: Procedure Laterality Date BRONCHOSCOPY Bilateral 11/05/2023 Procedure: BRONCHOSCOPY, RIGID OR FLEXIBLE, INCLUDING FLUOROSCOPIC GUIDANCE, WHEN PERFORMED; WITH BRONCHIAL ALVEOLAR LAVAGE; Surgeon: Novella Olive, MD; Location: [facility]; Service: Pulmonary; Laterality: Bilateral; TRANSPLANT HEART   Allergies Allergen Reactions Sulfa (Sulfonamide Antibiotics) Rash Amoxicillin-Pot Clavulanate Diarrhea Clindamycin Other (See Comments) Heartburn Heartburn  Sirolimus Other (See Comments) GI distress  Current Outpatient Medications on File Prior to Visit Medication Sig Dispense Refill aspirin 81 MG EC tablet Take 81 mg by mouth daily. calcium carbonate-vitamin D3 (OS-CAL 500 + D) 500 mg(1,250mg ) -500 unit Tab 1 tab by mouth 2 times a day  11 citalopram (CELEXA) 40 MG tablet Take 40 mg by mouth once daily clonazePAM (KLONOPIN) 0.5 MG tablet TAKE 2 TABLETS BY MOUTH TWO TIMES A DAY AS NEEDED FOR ANXIETY (Patient taking differently: Take 0.5 mg by mouth 2 (two) times daily) 120 tablet 5 digestive enzymes Cap Take 1 capsule by mouth once daily Ora Organic Digestive Enzymes L.acid/B.animalis,bifidum/FOS (PROBIOTIC COMPLEX ORAL) Take 2 capsules by mouth once daily Ora Organic Trust your gut probiotic magnesium oxide (MAG-OX) 400 mg tablet 1 tab by mouth daily 11 multivitamin tablet Take 1 tablet by mouth once daily 30 tablet 11 pantoprazole (PROTONIX) 40 MG DR tablet Take 1 tablet (40 mg total) by mouth 2 (two) times daily 180 tablet 3 predniSONE (DELTASONE) 5 MG tablet Take 1 tablet (5 mg total) by mouth once daily 30 tablet 2 rosuvastatin (CRESTOR) 40 MG tablet Take 1 tablet (40 mg total) by mouth once daily 90 tablet 3 tacrolimus (PROGRAF) 1 MG capsule Take 2 capsules (2 mg total) by mouth every 12 (twelve) hours 360 capsule 3 tadalafiL (CIALIS) 5 MG tablet Take 5 mg by mouth once daily Takes for BPH topiramate (TOPAMAX) 100 MG tablet Take 100 mg by mouth once daily  No current facility-administered medications on file prior to visit.  History reviewed. No pertinent family history.  Social History  Tobacco Use Smoking Status Never Smokeless Tobacco Never   Social History  Socioeconomic History Marital status: Married Number of children: 1 Tobacco Use Smoking status: Never Smokeless tobacco: Never Vaping Use Vaping status: Never Used Substance and Sexual Activity Drug use: Never Sexual activity: Defer  Social Drivers of Health  Financial Resource Strain: Low Risk (12/13/2023) Received from [facility] Overall Financial Resource Strain (CARDIA) Difficulty of Paying Living Expenses: Not hard at all Food Insecurity: No Food Insecurity (12/13/2023) Received from [facility] Hunger Vital Sign Worried About  Running Out of Food in the Last Year: Never true Ran Out of Food in the Last Year: Never true Transportation Needs: No Transportation Needs (12/13/2023) Received from [facility] - Transportation Lack of Transportation (Medical): No Lack of Transportation (Non-Medical): No Physical Activity: Sufficiently Active (12/13/2023) Received from [facility] Exercise Vital Sign Days of Exercise per Week: 3 days Minutes of Exercise per Session: 60 min Stress: No Stress Concern Present (12/13/2023) Received from [facility] of Occupational Health - Occupational Stress Questionnaire Feeling of Stress : Not at all Social Connections: Unknown (12/13/2023) Received from [facility] Social Connection and Isolation Panel [NHANES] Frequency of Communication with Friends and Family: Three times a week Frequency of Social Gatherings with Friends and Family: Once a week Attends Religious Services: Patient declined Active Member of Clubs or Organizations: No Marital Status: Married Housing Stability: Unknown (12/24/2023) Housing Stability Vital Sign Unable to Pay for Housing in the Last  Year: No Homeless in the Last Year: No  Objective:  Vitals: 12/24/23 0853 BP: 120/82 Pulse: 75 Temp: 36.9 C (98.4 F) SpO2: 98% Weight: 85.2 kg (187 lb 12.8 oz) Height: 177.8 cm (5\' 10" ) PainSc: 0-No pain  Body mass index is 26.95 kg/m.  Physical Exam HENT: Head: Normocephalic. Cardiovascular: Rate and Rhythm: Normal rate. Pulmonary: Effort: Pulmonary effort is normal. Abdominal: Tenderness: There is no abdominal tenderness. Hernia: A hernia is present. Hernia is present in the left inguinal area. There is no hernia in the right inguinal area.  Comments: Reducible left inguinal hernia. Mild tenderness to palpation. No evidence of right inguinal hernia exam today. Musculoskeletal: General: Normal range of motion. Skin: General: Skin is warm. Neurological: General: No  focal deficit present. Mental Status: He is alert. Psychiatric: Mood and Affect: Mood normal.    Assessment and Plan:  Diagnoses and all orders for this visit:  Left inguinal hernia   Discussed laparoscopic and open techniques of repair with mesh. He would like to proceed with open repair of his left inguinal hernia. We discussed the use of mesh. We discussed potential complications as well. Risk of bleeding, infection, recurrence, organ injury, nerve injury, blood vessel injury, injury to the testicle, the need further treatment and procedures reviewed today.   Hayden Rasmussen, MD  I spent a total of 46 minutes in both face-to-face and non-face-to-face activities, excluding procedures performed, for this visit on the date of this encounter.     Care Teams - documented as of this encounter  Care Teams Team Member Relationship Specialty Start Date End Date  Provider, External Referred To  [address]  [place], [place] [address]  [phone] (Work)  PCP - General   07/08/16    Blanca Friend, MD  NPI: [id number]  [address]  [facility]  [facility] [address]  [place], [place] [address]  [phone] (Work)  [phone] (Fax)  Consulting Provider Cardiovascular Disease 10/03/14     Patient Demographics  Patient Demographics Patient Address Patient Name Communication  Former (May 01, 2011 - Apr. 18, 2016): [address] [facility]) [place], [place] [address]  (Jul. 09, 2021 - ): [address] [facility]) [place], [place] [address] [name]  Former / Aliases: Brian Giustino [phone] [facility]) [phone] (Home) [email or web address]   Patient Demographics Language Race / Ethnicity Marital Status  English (Preferred) White / Not Hispanic or Latino Married   Patient Contacts  Patient Contacts Contact Name Contact Address Communication Relationship to Patient  Thaddues Petrilli Unknown [phone] [facility]) Parent, Personal Relationship  Tonie Adkisson Unknown [phone] (Mobile) Spouse, Personal Relationship   Document Information  Primary Care Provider Other Service Providers Document Coverage Dates  External Referred To Provider (Jul. 26, 2017July 26, 2017 - Present) [phone] (Work) [address] [place], [place] [address] [facility] [address] [place], [place] [address] Blanca Friend, MD (Consulting Provider) NPI: [id number] [phone] (Work) [phone] (Fax) [phone] [facility] [address] [facility] [address] [place], [place] [address] Cardiovascular Disease [facility] [address] [place], [place] [address] Jan. 10, 2025January 10, 2025   Custodian Organization  [facility] [address] [place], [place] [address]   Encounter Providers Encounter Date  Hayden Rasmussen, MD (Attending) NPI: [id number] [phone] (Work) [phone] (Fax) [address] [place], [place] [address] General Surgery Jan. 10, 2025January 10, 2025   Legal Authenticator   Chief Health Information Officer, Associate

## 2024-01-03 ENCOUNTER — Other Ambulatory Visit: Payer: Self-pay

## 2024-01-03 ENCOUNTER — Ambulatory Visit (HOSPITAL_COMMUNITY)
Admission: RE | Admit: 2024-01-03 | Discharge: 2024-01-03 | Disposition: A | Discharge status: Home / Self Care | Payer: BC Managed Care – PPO | Attending: Surgery | Admitting: Surgery

## 2024-01-03 ENCOUNTER — Encounter (HOSPITAL_COMMUNITY)
Admission: RE | Disposition: A | Discharge status: Home / Self Care | Payer: Self-pay | Source: Home / Self Care | Attending: Surgery

## 2024-01-03 ENCOUNTER — Encounter (HOSPITAL_COMMUNITY): Payer: Self-pay | Admitting: Surgery

## 2024-01-03 ENCOUNTER — Ambulatory Visit (HOSPITAL_COMMUNITY): Payer: Self-pay | Admitting: Physician Assistant

## 2024-01-03 ENCOUNTER — Ambulatory Visit (HOSPITAL_COMMUNITY): Payer: Self-pay | Admitting: Anesthesiology

## 2024-01-03 ENCOUNTER — Ambulatory Visit: Payer: Managed Care, Other (non HMO) | Admitting: Nurse Practitioner

## 2024-01-03 DIAGNOSIS — I130 Hypertensive heart and chronic kidney disease with heart failure and stage 1 through stage 4 chronic kidney disease, or unspecified chronic kidney disease: Secondary | ICD-10-CM | POA: Insufficient documentation

## 2024-01-03 DIAGNOSIS — Z941 Heart transplant status: Secondary | ICD-10-CM | POA: Insufficient documentation

## 2024-01-03 DIAGNOSIS — I509 Heart failure, unspecified: Secondary | ICD-10-CM | POA: Insufficient documentation

## 2024-01-03 DIAGNOSIS — K219 Gastro-esophageal reflux disease without esophagitis: Secondary | ICD-10-CM | POA: Diagnosis not present

## 2024-01-03 DIAGNOSIS — Z7960 Long term (current) use of unspecified immunomodulators and immunosuppressants: Secondary | ICD-10-CM | POA: Insufficient documentation

## 2024-01-03 DIAGNOSIS — N1831 Chronic kidney disease, stage 3a: Secondary | ICD-10-CM | POA: Insufficient documentation

## 2024-01-03 DIAGNOSIS — E785 Hyperlipidemia, unspecified: Secondary | ICD-10-CM | POA: Insufficient documentation

## 2024-01-03 DIAGNOSIS — Z7952 Long term (current) use of systemic steroids: Secondary | ICD-10-CM | POA: Insufficient documentation

## 2024-01-03 DIAGNOSIS — K4090 Unilateral inguinal hernia, without obstruction or gangrene, not specified as recurrent: Secondary | ICD-10-CM | POA: Diagnosis not present

## 2024-01-03 HISTORY — PX: INGUINAL HERNIA REPAIR: SHX194

## 2024-01-03 SURGERY — REPAIR, HERNIA, INGUINAL, ADULT
Anesthesia: General | Laterality: Left

## 2024-01-03 MED ORDER — OXYCODONE HCL 5 MG PO TABS
5.0000 mg | ORAL_TABLET | Freq: Once | ORAL | Status: AC | PRN
  Administered 2024-01-03: 5 mg via ORAL

## 2024-01-03 MED ORDER — BUPIVACAINE-EPINEPHRINE 0.25% -1:200000 IJ SOLN
INTRAMUSCULAR | Status: DC | PRN
  Administered 2024-01-03: 50 mL

## 2024-01-03 MED ORDER — ROCURONIUM BROMIDE 100 MG/10ML IV SOLN
INTRAVENOUS | Status: DC | PRN
  Administered 2024-01-03 (×2): 20 mg via INTRAVENOUS
  Administered 2024-01-03: 60 mg via INTRAVENOUS

## 2024-01-03 MED ORDER — ONDANSETRON HCL 4 MG/2ML IJ SOLN
INTRAMUSCULAR | Status: AC
Start: 2024-01-03 — End: ?
  Filled 2024-01-03: qty 2

## 2024-01-03 MED ORDER — ONDANSETRON HCL 4 MG/2ML IJ SOLN
INTRAMUSCULAR | Status: DC | PRN
  Administered 2024-01-03 (×2): 4 mg via INTRAVENOUS

## 2024-01-03 MED ORDER — LIDOCAINE HCL (CARDIAC) PF 100 MG/5ML IV SOSY
PREFILLED_SYRINGE | INTRAVENOUS | Status: DC | PRN
  Administered 2024-01-03: 50 mg via INTRAVENOUS

## 2024-01-03 MED ORDER — HYDROMORPHONE HCL 1 MG/ML IJ SOLN
0.2500 mg | INTRAMUSCULAR | Status: DC | PRN
Start: 2024-01-03 — End: 2024-01-03
  Administered 2024-01-03 (×2): 0.5 mg via INTRAVENOUS

## 2024-01-03 MED ORDER — FENTANYL CITRATE (PF) 100 MCG/2ML IJ SOLN
INTRAMUSCULAR | Status: DC | PRN
  Administered 2024-01-03: 100 ug via INTRAVENOUS

## 2024-01-03 MED ORDER — KETOROLAC TROMETHAMINE 30 MG/ML IJ SOLN: 30.0000 mg | Freq: Once | INTRAMUSCULAR | Status: DC | PRN

## 2024-01-03 MED ORDER — SODIUM CHLORIDE 0.9 % IV SOLN
3.0000 g | INTRAVENOUS | Status: AC
  Administered 2024-01-03: 3 g via INTRAVENOUS
  Filled 2024-01-03: qty 3

## 2024-01-03 MED ORDER — OXYCODONE HCL 5 MG/5ML PO SOLN: 5.0000 mg | Freq: Once | ORAL | Status: AC | PRN

## 2024-01-03 MED ORDER — ROCURONIUM BROMIDE 10 MG/ML (PF) SYRINGE
PREFILLED_SYRINGE | INTRAVENOUS | Status: AC
  Filled 2024-01-03: qty 10

## 2024-01-03 MED ORDER — HYDROMORPHONE HCL 1 MG/ML IJ SOLN
INTRAMUSCULAR | Status: AC
  Filled 2024-01-03: qty 1

## 2024-01-03 MED ORDER — IBUPROFEN 800 MG PO TABS: 800.0000 mg | ORAL_TABLET | Freq: Three times a day (TID) | ORAL | 0 refills | Status: DC | PRN

## 2024-01-03 MED ORDER — MIDAZOLAM HCL 5 MG/5ML IJ SOLN
INTRAMUSCULAR | Status: DC | PRN
  Administered 2024-01-03: 2 mg via INTRAVENOUS

## 2024-01-03 MED ORDER — DEXAMETHASONE SODIUM PHOSPHATE 4 MG/ML IJ SOLN
INTRAMUSCULAR | Status: DC | PRN
  Administered 2024-01-03: 8 mg via INTRAVENOUS

## 2024-01-03 MED ORDER — LIDOCAINE HCL 1 % IJ SOLN
INTRAMUSCULAR | Status: AC
  Filled 2024-01-03: qty 20

## 2024-01-03 MED ORDER — MIDAZOLAM HCL 2 MG/2ML IJ SOLN
INTRAMUSCULAR | Status: AC
Start: 2024-01-03 — End: ?
  Filled 2024-01-03: qty 2

## 2024-01-03 MED ORDER — CHLORHEXIDINE GLUCONATE CLOTH 2 % EX PADS: 6.0000 | MEDICATED_PAD | Freq: Once | CUTANEOUS | Status: DC

## 2024-01-03 MED ORDER — OXYCODONE HCL 5 MG PO TABS
ORAL_TABLET | ORAL | Status: AC
  Filled 2024-01-03: qty 1

## 2024-01-03 MED ORDER — LACTATED RINGERS IV SOLN: INTRAVENOUS | Status: DC

## 2024-01-03 MED ORDER — ONDANSETRON HCL 4 MG/2ML IJ SOLN: 4.0000 mg | Freq: Once | INTRAMUSCULAR | Status: DC | PRN

## 2024-01-03 MED ORDER — OXYCODONE HCL 5 MG PO TABS: 5.0000 mg | ORAL_TABLET | Freq: Four times a day (QID) | ORAL | 0 refills | Status: DC | PRN

## 2024-01-03 MED ORDER — ORAL CARE MOUTH RINSE: 15.0000 mL | Freq: Once | OROMUCOSAL | Status: AC

## 2024-01-03 MED ORDER — BUPIVACAINE-EPINEPHRINE 0.25% -1:200000 IJ SOLN
INTRAMUSCULAR | Status: AC
  Filled 2024-01-03: qty 1

## 2024-01-03 MED ORDER — FENTANYL CITRATE (PF) 100 MCG/2ML IJ SOLN
INTRAMUSCULAR | Status: AC
  Filled 2024-01-03: qty 2

## 2024-01-03 MED ORDER — PROPOFOL 10 MG/ML IV BOLUS
INTRAVENOUS | Status: AC
  Filled 2024-01-03: qty 20

## 2024-01-03 MED ORDER — GABAPENTIN 300 MG PO CAPS
300.0000 mg | ORAL_CAPSULE | ORAL | Status: AC
  Administered 2024-01-03: 300 mg via ORAL
  Filled 2024-01-03: qty 1

## 2024-01-03 MED ORDER — SUGAMMADEX SODIUM 200 MG/2ML IV SOLN
INTRAVENOUS | Status: DC | PRN
  Administered 2024-01-03: 200 mg via INTRAVENOUS

## 2024-01-03 MED ORDER — CHLORHEXIDINE GLUCONATE 0.12 % MT SOLN
15.0000 mL | Freq: Once | OROMUCOSAL | Status: AC
  Administered 2024-01-03: 15 mL via OROMUCOSAL

## 2024-01-03 MED ORDER — DEXAMETHASONE SODIUM PHOSPHATE 10 MG/ML IJ SOLN
INTRAMUSCULAR | Status: AC
  Filled 2024-01-03: qty 1

## 2024-01-03 MED ORDER — PROPOFOL 10 MG/ML IV BOLUS
INTRAVENOUS | Status: DC | PRN
  Administered 2024-01-03: 180 mg via INTRAVENOUS

## 2024-01-03 SURGICAL SUPPLY — 40 items
BAG COUNTER SPONGE SURGICOUNT (BAG) IMPLANT
BENZOIN TINCTURE PRP APPL 2/3 (GAUZE/BANDAGES/DRESSINGS) IMPLANT
BLADE HEX COATED 2.75 (ELECTRODE) ×2 IMPLANT
BLADE SURG 15 STRL LF DISP TIS (BLADE) ×2 IMPLANT
DERMABOND ADVANCED .7 DNX12 (GAUZE/BANDAGES/DRESSINGS) IMPLANT
DRAIN PENROSE 0.5X18 (DRAIN) ×2 IMPLANT
DRAPE LAPAROTOMY TRNSV 102X78 (DRAPES) ×2 IMPLANT
ELECT REM PT RETURN 15FT ADLT (MISCELLANEOUS) ×2 IMPLANT
GAUZE SPONGE 4X4 12PLY STRL (GAUZE/BANDAGES/DRESSINGS) IMPLANT
GLOVE BIOGEL PI IND STRL 7.0 (GLOVE) ×2 IMPLANT
GLOVE INDICATOR 8.0 STRL GRN (GLOVE) ×4 IMPLANT
GLOVE SS BIOGEL STRL SZ 8 (GLOVE) ×2 IMPLANT
GOWN STRL REUS W/ TWL XL LVL3 (GOWN DISPOSABLE) ×4 IMPLANT
KIT BASIN OR (CUSTOM PROCEDURE TRAY) ×2 IMPLANT
KIT TURNOVER KIT A (KITS) IMPLANT
MARKER SKIN DUAL TIP RULER LAB (MISCELLANEOUS) ×2 IMPLANT
MESH HERNIA SYS ULTRAPRO LRG (Mesh General) IMPLANT
NDL HYPO 22X1.5 SAFETY MO (MISCELLANEOUS) IMPLANT
NDL HYPO 25X1 1.5 SAFETY (NEEDLE) ×2 IMPLANT
NEEDLE HYPO 22X1.5 SAFETY MO (MISCELLANEOUS) ×1 IMPLANT
NEEDLE HYPO 25X1 1.5 SAFETY (NEEDLE) ×1 IMPLANT
NS IRRIG 1000ML POUR BTL (IV SOLUTION) ×2 IMPLANT
PACK BASIC VI WITH GOWN DISP (CUSTOM PROCEDURE TRAY) ×2 IMPLANT
PENCIL SMOKE EVACUATOR (MISCELLANEOUS) IMPLANT
SPIKE FLUID TRANSFER (MISCELLANEOUS) ×2 IMPLANT
STRIP CLOSURE SKIN 1/2X4 (GAUZE/BANDAGES/DRESSINGS) IMPLANT
SUT MNCRL AB 4-0 PS2 18 (SUTURE) ×2 IMPLANT
SUT NOVA 0 T19/GS 22DT (SUTURE) IMPLANT
SUT NOVA NAB DX-16 0-1 5-0 T12 (SUTURE) IMPLANT
SUT NOVA NAB GS-21 0 18 T12 DT (SUTURE) ×4 IMPLANT
SUT SILK 2 0 SH (SUTURE) IMPLANT
SUT SILK 3-0 18XBRD TIE 12 (SUTURE) IMPLANT
SUT VIC AB 0 CT1 36 (SUTURE) ×2 IMPLANT
SUT VIC AB 2-0 SH 27X BRD (SUTURE) ×2 IMPLANT
SUT VIC AB 3-0 SH 18 (SUTURE) ×2 IMPLANT
SUT VIC AB 3-0 SH 27XBRD (SUTURE) ×2 IMPLANT
SUT VICRYL 3 0 BR 18 UND (SUTURE) ×2 IMPLANT
SYR BULB IRRIG 60ML STRL (SYRINGE) ×2 IMPLANT
SYR CONTROL 10ML LL (SYRINGE) ×2 IMPLANT
TOWEL OR 17X26 10 PK STRL BLUE (TOWEL DISPOSABLE) ×2 IMPLANT

## 2024-01-03 NOTE — Anesthesia Procedure Notes (Signed)
Procedure Name: Intubation Date/Time: 01/03/2024 7:28 AM  Performed by: Carloyn Manner, CRNAPre-anesthesia Checklist: Patient identified, Emergency Drugs available, Suction available, Patient being monitored and Timeout performed Patient Re-evaluated:Patient Re-evaluated prior to induction Oxygen Delivery Method: Circle system utilized Preoxygenation: Pre-oxygenation with 100% oxygen Induction Type: IV induction Ventilation: Mask ventilation without difficulty Laryngoscope Size: Miller, 2, Glidescope and 3 Grade View: Grade II Tube type: Oral Tube size: 7.5 mm Number of attempts: 2 Airway Equipment and Method: Stylet Placement Confirmation: ETT inserted through vocal cords under direct vision, positive ETCO2 and breath sounds checked- equal and bilateral Secured at: 21 cm Tube secured with: Tape Dental Injury: Teeth and Oropharynx as per pre-operative assessment and Bloody posterior oropharynx  Difficulty Due To: Difficulty was unanticipated Comments: IV Induction, easy to mast without AW, very small mouth, recommend video laryngoscopy. Chrisandra Netters, CRNA

## 2024-01-03 NOTE — Interval H&P Note (Signed)
History and Physical Interval Note:  01/03/2024 7:08 AM  Shawn Calderon  has presented today for surgery, with the diagnosis of Left Inguinal Hernia.  The various methods of treatment have been discussed with the patient and family. After consideration of risks, benefits and other options for treatment, the patient has consented to  Procedure(s) with comments: REPAIR LEFT INGUINAL HERNIA WITH MESH (Left) - GENERAL & TAP BLOCK as a surgical intervention.  The patient's history has been reviewed, patient examined, no change in status, stable for surgery.  I have reviewed the patient's chart and labs.  Questions were answered to the patient's satisfaction.    The risk of hernia repair include bleeding,  Infection,   Recurrence of the hernia,  Mesh use, chronic pain,  Organ injury,  Bowel injury,  Bladder injury,   nerve injury with numbness around the incision,  Death,  and worsening of preexisting  medical problems.  The alternatives to surgery have been discussed as well..  Long term expectations of both operative and non operative treatments have been discussed.   The patient agrees to proceed.  [name] A [name]

## 2024-01-03 NOTE — Op Note (Signed)
Preoperative diagnosis: Symptomatic left inguinal hernia reducible initial  Postoperative diagnosis: Same  Procedure: Repair of left inguinal hernia with ultra pro hernia system mesh  Surgeon: Harriette Bouillon, MD  Anesthesia: General With 0.25% Marcaine with epinephrine  EBL: Minimal  Specimen: None  IV fluids: Per OR record  Indications for procedure: The patient is a 48 year old male with a symptomatic left inguinal hernia.  He presents for repair after reviewing his options of repair with mesh and different techniques of open versus laparoscopic.  He opted for an open repair of his left inguinal hernia with mesh.The risk of hernia repair include bleeding,  Infection,   Recurrence of the hernia,  Mesh use, chronic pain,  Organ injury,  Bowel injury,  Bladder injury,   nerve injury with numbness around the incision,  Death,  and worsening of preexisting  medical problems.  The alternatives to surgery have been discussed as well..  Long term expectations of both operative and non operative treatments have been discussed.   The patient agrees to proceed.     Description of procedure: The patient was met in the holding area questions were answered.  Left side was marked as correct site.  He was taken to the operative room.  He was placed supine upon the operative room table.  After induction of general anesthesia, left groin was prepped and draped in a sterile fashion timeout performed.  Proper patient, site and procedure were verified.  An incision was made in the left inguinal crease after infiltration of the skin with 0.25% Marcaine with epinephrine.  Dissection was carried down through Scarpa's fascia into the aponeurosis of the external bleak was identified.  A small incision was made and the fibers were opened through the external ring with Metzenbaum scissors.  Cord structures were encircled with 1/4 inch Penrose drain.  Dissection of the cord revealed an indirect hernia sac and a large  lipoma.  These were dissected off the cord and reduced into the preperitoneal space.  A large piece of the ultra Pro hernia system was used with the inner leaflet placed into the preperitoneal space and the onlay placed onto the floor the inguinal canal.  A slit was cut for the cord structures.  The mesh was secured to the shelving edge of the inguinal inguinal ligament with #1 Novafil suture.  It was also secured to the pubic tubercle and conjoined tendon and internal bleak with #1 Novafil suture.  The slit was closed carefully on the cord structures to prevent entrapment.  The ilioinguinal nerve was identified and divided as it exited the muscle to prevent postop neuralgia entrapment.  There is ample room for the cord exit without any undue tension on the repair.  There is no bleeding.  Local anesthetic was infiltrated throughout for local control of pain.  The aponeurosis of the external bleak was closed with 2-0 Vicryl.  3-0 Vicryl was used to approximate Scarpa's fascia.  4 Monocryl was used to close the skin in a subcuticular fashion.  Dermabond applied.  All counts found to be correct.  The patient was awoke extubated taken to recovery in satisfactory condition.

## 2024-01-03 NOTE — Anesthesia Postprocedure Evaluation (Signed)
Anesthesia Post Note  Patient: Shawn Calderon  Procedure(s) Performed: REPAIR LEFT INGUINAL HERNIA WITH MESH (Left)     Patient location during evaluation: PACU Anesthesia Type: General Level of consciousness: awake and alert Pain management: pain level controlled Vital Signs Assessment: post-procedure vital signs reviewed and stable Respiratory status: spontaneous breathing, nonlabored ventilation, respiratory function stable and patient connected to nasal cannula oxygen Cardiovascular status: blood pressure returned to baseline and stable Postop Assessment: no apparent nausea or vomiting Anesthetic complications: yes   Encounter Notable Events  Notable Event Outcome Phase Comment  Difficult to intubate - unexpected  Intraprocedure Filed from anesthesia note documentation.    Last Vitals:  Vitals:   01/03/24 1048 01/03/24 1049  BP:    Pulse:    Resp:    Temp:    SpO2: 98% 98%    Last Pain:  Vitals:   01/03/24 1023  TempSrc: Oral  PainSc: 3                  Trevor Iha

## 2024-01-03 NOTE — Transfer of Care (Signed)
Immediate Anesthesia Transfer of Care Note  Patient: Shawn Calderon  Procedure(s) Performed: REPAIR LEFT INGUINAL HERNIA WITH MESH (Left)  Patient Location: PACU  Anesthesia Type:General  Level of Consciousness: awake and alert   Airway & Oxygen Therapy: Patient Spontanous Breathing and Patient connected to nasal cannula oxygen  Post-op Assessment: Report given to RN and Post -op Vital signs reviewed and stable  Post vital signs: Reviewed and stable  Last Vitals:  Vitals Value Taken Time  BP 123/58 01/03/24 0904  Temp    Pulse 81 01/03/24 0905  Resp 18 01/03/24 0905  SpO2 95 % 01/03/24 0905  Vitals shown include unfiled device data.  Last Pain:  Vitals:   01/03/24 0551  TempSrc: Oral  PainSc: 3       Patients Stated Pain Goal: 6 (01/03/24 0551)  Complications:  Encounter Notable Events  Notable Event Outcome Phase Comment  Difficult to intubate - unexpected  Intraprocedure Filed from anesthesia note documentation.

## 2024-01-03 NOTE — Discharge Instructions (Signed)
[facility] _______[facility]  UMBILICAL OR INGUINAL HERNIA REPAIR: POST OP INSTRUCTIONS  Always review your discharge instruction sheet given to you by the facility where your surgery was performed. IF YOU HAVE DISABILITY OR FAMILY LEAVE FORMS, YOU MUST BRING THEM TO THE OFFICE FOR PROCESSING.   DO NOT GIVE THEM TO YOUR DOCTOR.  1. A  prescription for pain medication may be given to you upon discharge.  Take your pain medication as prescribed, if needed.  If narcotic pain medicine is not needed, then you may take acetaminophen (Tylenol) or ibuprofen (Advil) as needed. 2. Take your usually prescribed medications unless otherwise directed. If you need a refill on your pain medication, please contact your pharmacy.  They will contact our office to request authorization. Prescriptions will not be filled after 5 pm or on week-ends. 3. You should follow a light diet the first 24 hours after arrival home, such as soup and crackers, etc.  Be sure to include lots of fluids daily.  Resume your normal diet the day after surgery. 4.Most patients will experience some swelling and bruising around the umbilicus or in the groin and scrotum.  Ice packs and reclining will help.  Swelling and bruising can take several days to resolve.  6. It is common to experience some constipation if taking pain medication after surgery.  Increasing fluid intake and taking a stool softener (such as Colace) will usually help or prevent this problem from occurring.  A mild laxative (Milk of Magnesia or Miralax) should be taken according to package directions if there are no bowel movements after 48 hours. 7. Unless discharge instructions indicate otherwise, you may remove your bandages 24-48 hours after surgery, and you may shower at that time.  You may have steri-strips (small skin tapes) in place directly over the incision.  These strips should be left on the skin for 7-10 days.  If your surgeon used skin glue on the  incision, you may shower in 24 hours.  The glue will flake off over the next 2-3 weeks.  Any sutures or staples will be removed at the office during your follow-up visit. 8. ACTIVITIES:  You may resume regular (light) daily activities beginning the next day--such as daily self-care, walking, climbing stairs--gradually increasing activities as tolerated.  You may have sexual intercourse when it is comfortable.  Refrain from any heavy lifting or straining until approved by your doctor.  a.You may drive when you are no longer taking prescription pain medication, you can comfortably wear a seatbelt, and you can safely maneuver your car and apply brakes. b.RETURN TO WORK:   _____________________________________________  9.You should see your doctor in the office for a follow-up appointment approximately 2-3 weeks after your surgery.  Make sure that you call for this appointment within a day or two after you arrive home to insure a convenient appointment time. 10.OTHER INSTRUCTIONS: _________________________    _____________________________________  WHEN TO CALL YOUR DOCTOR: Fever over 101.0 Inability to urinate Nausea and/or vomiting Extreme swelling or bruising Continued bleeding from incision. Increased pain, redness, or drainage from the incision  The clinic staff is available to answer your questions during regular business hours.  Please don't hesitate to call and ask to speak to one of the nurses for clinical concerns.  If you have a medical emergency, go to the nearest emergency room or call 911.  A surgeon from [facility] is always on call at the hospital   [address], [address],   [place], [place]  [address] ?  [address] [address], [place], [place]   [address] [phone] ? [phone] ? FAX [phone] Web site: [email or web address]  

## 2024-01-04 ENCOUNTER — Encounter (HOSPITAL_COMMUNITY): Payer: Self-pay | Admitting: Surgery

## 2024-02-14 DIAGNOSIS — F331 Major depressive disorder, recurrent, moderate: Secondary | ICD-10-CM | POA: Diagnosis not present

## 2024-02-14 DIAGNOSIS — F330 Major depressive disorder, recurrent, mild: Secondary | ICD-10-CM | POA: Diagnosis not present

## 2024-02-14 DIAGNOSIS — F411 Generalized anxiety disorder: Secondary | ICD-10-CM | POA: Diagnosis not present

## 2024-02-14 DIAGNOSIS — F339 Major depressive disorder, recurrent, unspecified: Secondary | ICD-10-CM | POA: Diagnosis not present

## 2024-02-23 DIAGNOSIS — Z48298 Encounter for aftercare following other organ transplant: Secondary | ICD-10-CM | POA: Diagnosis not present

## 2024-02-23 DIAGNOSIS — Z941 Heart transplant status: Secondary | ICD-10-CM | POA: Diagnosis not present

## 2024-03-09 DIAGNOSIS — Z4821 Encounter for aftercare following heart transplant: Secondary | ICD-10-CM | POA: Diagnosis not present

## 2024-03-09 DIAGNOSIS — Z79624 Long term (current) use of inhibitors of nucleotide synthesis: Secondary | ICD-10-CM | POA: Diagnosis not present

## 2024-03-09 DIAGNOSIS — Z48298 Encounter for aftercare following other organ transplant: Secondary | ICD-10-CM | POA: Diagnosis not present

## 2024-03-09 DIAGNOSIS — I129 Hypertensive chronic kidney disease with stage 1 through stage 4 chronic kidney disease, or unspecified chronic kidney disease: Secondary | ICD-10-CM | POA: Diagnosis not present

## 2024-03-09 DIAGNOSIS — Z2989 Encounter for other specified prophylactic measures: Secondary | ICD-10-CM | POA: Diagnosis not present

## 2024-03-09 DIAGNOSIS — Z941 Heart transplant status: Secondary | ICD-10-CM | POA: Diagnosis not present

## 2024-03-09 DIAGNOSIS — N1831 Chronic kidney disease, stage 3a: Secondary | ICD-10-CM | POA: Diagnosis not present

## 2024-03-09 DIAGNOSIS — D84821 Immunodeficiency due to drugs: Secondary | ICD-10-CM | POA: Diagnosis not present

## 2024-03-09 DIAGNOSIS — Z79899 Other long term (current) drug therapy: Secondary | ICD-10-CM | POA: Diagnosis not present

## 2024-03-30 DIAGNOSIS — Z79899 Other long term (current) drug therapy: Secondary | ICD-10-CM | POA: Diagnosis not present

## 2024-03-30 DIAGNOSIS — Z941 Heart transplant status: Secondary | ICD-10-CM | POA: Diagnosis not present

## 2024-03-30 DIAGNOSIS — Z48298 Encounter for aftercare following other organ transplant: Secondary | ICD-10-CM | POA: Diagnosis not present

## 2024-03-30 DIAGNOSIS — Z2989 Encounter for other specified prophylactic measures: Secondary | ICD-10-CM | POA: Diagnosis not present

## 2024-04-14 ENCOUNTER — Telehealth: Admitting: Family Medicine

## 2024-04-14 DIAGNOSIS — J069 Acute upper respiratory infection, unspecified: Secondary | ICD-10-CM

## 2024-04-14 DIAGNOSIS — R062 Wheezing: Secondary | ICD-10-CM

## 2024-04-14 MED ORDER — AZITHROMYCIN 250 MG PO TABS: ORAL_TABLET | ORAL | 0 refills | Status: AC

## 2024-04-14 MED ORDER — PREDNISONE 20 MG PO TABS: 20.0000 mg | ORAL_TABLET | Freq: Two times a day (BID) | ORAL | 0 refills | Status: AC

## 2024-04-14 MED ORDER — BENZONATATE 200 MG PO CAPS: 200.0000 mg | ORAL_CAPSULE | Freq: Two times a day (BID) | ORAL | 0 refills | Status: DC | PRN

## 2024-04-14 NOTE — Progress Notes (Signed)
 Virtual Visit Consent   Shawn Calderon, you are scheduled for a virtual visit with a [facility] provider today. Just as with appointments in the office, your consent must be obtained to participate. Your consent will be active for this visit and any virtual visit you may have with one of our providers in the next 365 days. If you have a MyChart account, a copy of this consent can be sent to you electronically.  As this is a virtual visit, video technology does not allow for your provider to perform a traditional examination. This may limit your provider's ability to fully assess your condition. If your provider identifies any concerns that need to be evaluated in person or the need to arrange testing (such as labs, EKG, etc.), we will make arrangements to do so. Although advances in technology are sophisticated, we cannot ensure that it will always work on either your end or our end. If the connection with a video visit is poor, the visit may have to be switched to a telephone visit. With either a video or telephone visit, we are not always able to ensure that we have a secure connection.  By engaging in this virtual visit, you consent to the provision of healthcare and authorize for your insurance to be billed (if applicable) for the services provided during this visit. Depending on your insurance coverage, you may receive a charge related to this service.  I need to obtain your verbal consent now. Are you willing to proceed with your visit today? Shawn Calderon has provided verbal consent on 04/14/2024 for a virtual visit (video or telephone). Albertha Huger, FNP  Date: 04/14/2024 6:30 PM   Virtual Visit via Video Note   I, Albertha Huger, connected with  Shawn Calderon  ([id number], 1976/11/08) on 04/14/24 at  6:30 PM EDT by a video-enabled telemedicine application and verified that I am speaking with the correct person using two identifiers.  Location: Patient: Virtual Visit Location Patient:  Home Provider: Virtual Visit Location Provider: Home Office   I discussed the limitations of evaluation and management by telemedicine and the availability of in person appointments. The patient expressed understanding and agreed to proceed.    History of Present Illness: Shawn Calderon is a 48 y.o. who identifies as a male who was assigned male at birth, and is being seen today for cough for 3-4 weeks, runny nose, sx since going to [place] . He is immune suppressed with significant PMH. Mucus is yellow. He is wheezing also. He has a history of pneumonia. 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   Hyperlipidemia 12/27/2012   Heart transplant status (HCC) 11/24/2011   Hypertension 11/24/2011   OCD (obsessive compulsive disorder) 11/24/2011    Allergies:  Allergies  Allergen Reactions   Sulfamethoxazole Hives and Rash   Amoxicillin -Pot Clavulanate Diarrhea   Clindamycin Other (See Comments)    Heartburn   Reglan [Metoclopramide]     Unknown reaction    Sirolimus Other (See Comments)  GI distress   Compazine [Prochlorperazine] Anxiety   Medications:  Current Outpatient Medications:    azithromycin  (ZITHROMAX ) 250 MG tablet, Take 2 tablets on day 1, then 1 tablet daily on days 2 through 5, Disp: 6 tablet, Rfl: 0   benzonatate  (TESSALON ) 200 MG capsule, Take 1 capsule (200 mg total) by mouth 2 (two) times daily as needed for cough., Disp: 20 capsule, Rfl: 0   predniSONE  (DELTASONE ) 20 MG  tablet, Take 1 tablet (20 mg total) by mouth 2 (two) times daily with a meal for 5 days., Disp: 10 tablet, Rfl: 0   acetaminophen  (TYLENOL ) 500 MG tablet, Take 500-1,000 mg by mouth as needed., Disp: , Rfl:    aspirin EC 81 MG tablet, Take 1 tablet by mouth daily., Disp: , Rfl:    Calcium  Carbonate-Vitamin D (OSCAL 500 D-3 PO), Take 1 tablet by mouth daily., Disp: , Rfl:    cetirizine  (ZYRTEC  ALLERGY) 10 MG tablet, Take 1 tablet (10 mg total) by mouth daily. (Patient taking differently: Take 10 mg by mouth daily as needed for allergies.), Disp: 90 tablet, Rfl: 1   citalopram  (CELEXA ) 40 MG tablet, Take 40 mg by mouth daily., Disp: , Rfl:    clonazePAM  (KLONOPIN ) 0.5 MG tablet, Take 0.5 mg by mouth 2 (two) times daily., Disp: , Rfl:    ibuprofen  (ADVIL ) 800 MG tablet, Take 1 tablet (800 mg total) by mouth every 8 (eight) hours as needed., Disp: 30 tablet, Rfl: 0   magnesium  oxide (MAG-OX) 400 MG tablet, Take 400 mg by mouth daily., Disp: , Rfl:    Multiple Vitamin (MULTIVITAMIN WITH MINERALS) TABS tablet, Take 1 tablet by mouth daily., Disp: , Rfl:    oxyCODONE  (OXY IR/ROXICODONE ) 5 MG immediate release tablet, Take 1 tablet (5 mg total) by mouth every 6 (six) hours as needed for severe pain (pain score 7-10)., Disp: 15 tablet, Rfl: 0   pantoprazole (PROTONIX) 40 MG tablet, Take 40 mg by mouth 2 (two) times daily., Disp: , Rfl:    predniSONE  (DELTASONE ) 5 MG tablet, Take 5 mg by mouth daily with breakfast., Disp: , Rfl:    rosuvastatin  (CRESTOR ) 40 MG tablet, Take 40 mg by mouth daily., Disp: , Rfl:    tacrolimus  (PROGRAF ) 1 MG capsule, Take 2 mg by mouth 2 (two) times daily. , Disp: , Rfl:    tadalafil (CIALIS) 5 MG tablet, Take 5 mg by mouth daily., Disp: , Rfl:    topiramate (TOPAMAX) 100 MG tablet, Take 100 mg by mouth daily., Disp: , Rfl:   Observations/Objective: Patient is well-developed, well-nourished in no acute distress.  Resting comfortably  at home.  Head is normocephalic,  atraumatic.  No labored breathing.  Speech is clear and coherent with logical content.  Patient is alert and oriented at baseline.    Assessment and Plan: 1. Upper respiratory tract infection, unspecified type (Primary)  In no distress, UC if sx worsen.   Follow Up Instructions: I discussed the assessment and treatment plan with the patient. The patient was provided an opportunity to ask questions and all were answered. The patient agreed with the plan and demonstrated an understanding of the instructions.  A copy of instructions were sent to the patient via MyChart unless otherwise noted below.     The patient was advised to call back or seek an in-person evaluation if the symptoms worsen or if the condition fails to improve as anticipated.    Dorotha Hirschi, FNP

## 2024-04-14 NOTE — Patient Instructions (Signed)

## 2024-04-25 DIAGNOSIS — K529 Noninfective gastroenteritis and colitis, unspecified: Secondary | ICD-10-CM | POA: Diagnosis not present

## 2024-04-25 DIAGNOSIS — Z941 Heart transplant status: Secondary | ICD-10-CM | POA: Diagnosis not present

## 2024-04-25 DIAGNOSIS — R140 Abdominal distension (gaseous): Secondary | ICD-10-CM | POA: Diagnosis not present

## 2024-04-25 DIAGNOSIS — K219 Gastro-esophageal reflux disease without esophagitis: Secondary | ICD-10-CM | POA: Diagnosis not present

## 2024-05-26 DIAGNOSIS — Z2989 Encounter for other specified prophylactic measures: Secondary | ICD-10-CM | POA: Diagnosis not present

## 2024-05-26 DIAGNOSIS — Z941 Heart transplant status: Secondary | ICD-10-CM | POA: Diagnosis not present

## 2024-05-27 DIAGNOSIS — K2100 Gastro-esophageal reflux disease with esophagitis, without bleeding: Secondary | ICD-10-CM | POA: Diagnosis not present

## 2024-05-27 DIAGNOSIS — R109 Unspecified abdominal pain: Secondary | ICD-10-CM | POA: Diagnosis not present

## 2024-05-27 DIAGNOSIS — K638219 Small intestinal bacterial overgrowth, unspecified: Secondary | ICD-10-CM | POA: Diagnosis not present

## 2024-05-27 DIAGNOSIS — R112 Nausea with vomiting, unspecified: Secondary | ICD-10-CM | POA: Diagnosis not present

## 2024-05-27 DIAGNOSIS — E785 Hyperlipidemia, unspecified: Secondary | ICD-10-CM | POA: Diagnosis not present

## 2024-05-27 DIAGNOSIS — R197 Diarrhea, unspecified: Secondary | ICD-10-CM | POA: Diagnosis not present

## 2024-05-27 DIAGNOSIS — R1013 Epigastric pain: Secondary | ICD-10-CM | POA: Diagnosis not present

## 2024-05-27 DIAGNOSIS — K648 Other hemorrhoids: Secondary | ICD-10-CM | POA: Diagnosis not present

## 2024-05-27 DIAGNOSIS — D849 Immunodeficiency, unspecified: Secondary | ICD-10-CM | POA: Diagnosis not present

## 2024-05-27 DIAGNOSIS — F411 Generalized anxiety disorder: Secondary | ICD-10-CM | POA: Diagnosis not present

## 2024-05-27 DIAGNOSIS — R110 Nausea: Secondary | ICD-10-CM | POA: Diagnosis not present

## 2024-05-27 DIAGNOSIS — Z941 Heart transplant status: Secondary | ICD-10-CM | POA: Diagnosis not present

## 2024-05-27 DIAGNOSIS — K2900 Acute gastritis without bleeding: Secondary | ICD-10-CM | POA: Diagnosis not present

## 2024-05-27 DIAGNOSIS — F419 Anxiety disorder, unspecified: Secondary | ICD-10-CM | POA: Diagnosis not present

## 2024-05-27 DIAGNOSIS — F331 Major depressive disorder, recurrent, moderate: Secondary | ICD-10-CM | POA: Diagnosis not present

## 2024-05-27 DIAGNOSIS — Z881 Allergy status to other antibiotic agents status: Secondary | ICD-10-CM | POA: Diagnosis not present

## 2024-05-27 DIAGNOSIS — E440 Moderate protein-calorie malnutrition: Secondary | ICD-10-CM | POA: Diagnosis not present

## 2024-05-27 DIAGNOSIS — F429 Obsessive-compulsive disorder, unspecified: Secondary | ICD-10-CM | POA: Diagnosis not present

## 2024-05-27 DIAGNOSIS — K222 Esophageal obstruction: Secondary | ICD-10-CM | POA: Diagnosis not present

## 2024-05-27 DIAGNOSIS — E876 Hypokalemia: Secondary | ICD-10-CM | POA: Diagnosis not present

## 2024-05-27 DIAGNOSIS — Z9049 Acquired absence of other specified parts of digestive tract: Secondary | ICD-10-CM | POA: Diagnosis not present

## 2024-05-27 DIAGNOSIS — K31A11 Gastric intestinal metaplasia without dysplasia, involving the antrum: Secondary | ICD-10-CM | POA: Diagnosis not present

## 2024-05-27 DIAGNOSIS — D649 Anemia, unspecified: Secondary | ICD-10-CM | POA: Diagnosis not present

## 2024-05-27 DIAGNOSIS — K6389 Other specified diseases of intestine: Secondary | ICD-10-CM | POA: Diagnosis not present

## 2024-05-27 DIAGNOSIS — K2950 Unspecified chronic gastritis without bleeding: Secondary | ICD-10-CM | POA: Diagnosis not present

## 2024-05-27 DIAGNOSIS — I440 Atrioventricular block, first degree: Secondary | ICD-10-CM | POA: Diagnosis not present

## 2024-05-27 DIAGNOSIS — N400 Enlarged prostate without lower urinary tract symptoms: Secondary | ICD-10-CM | POA: Diagnosis not present

## 2024-05-27 DIAGNOSIS — R634 Abnormal weight loss: Secondary | ICD-10-CM | POA: Diagnosis not present

## 2024-05-27 DIAGNOSIS — K5730 Diverticulosis of large intestine without perforation or abscess without bleeding: Secondary | ICD-10-CM | POA: Diagnosis not present

## 2024-05-27 DIAGNOSIS — D509 Iron deficiency anemia, unspecified: Secondary | ICD-10-CM | POA: Diagnosis not present

## 2024-05-27 DIAGNOSIS — D631 Anemia in chronic kidney disease: Secondary | ICD-10-CM | POA: Diagnosis not present

## 2024-05-27 DIAGNOSIS — K529 Noninfective gastroenteritis and colitis, unspecified: Secondary | ICD-10-CM | POA: Diagnosis not present

## 2024-05-27 DIAGNOSIS — I129 Hypertensive chronic kidney disease with stage 1 through stage 4 chronic kidney disease, or unspecified chronic kidney disease: Secondary | ICD-10-CM | POA: Diagnosis not present

## 2024-05-27 DIAGNOSIS — K219 Gastro-esophageal reflux disease without esophagitis: Secondary | ICD-10-CM | POA: Diagnosis not present

## 2024-05-27 DIAGNOSIS — N1831 Chronic kidney disease, stage 3a: Secondary | ICD-10-CM | POA: Diagnosis not present

## 2024-05-27 DIAGNOSIS — Z6826 Body mass index (BMI) 26.0-26.9, adult: Secondary | ICD-10-CM | POA: Diagnosis not present

## 2024-05-27 DIAGNOSIS — Z1152 Encounter for screening for COVID-19: Secondary | ICD-10-CM | POA: Diagnosis not present

## 2024-05-28 DIAGNOSIS — K2950 Unspecified chronic gastritis without bleeding: Secondary | ICD-10-CM | POA: Diagnosis not present

## 2024-05-28 DIAGNOSIS — R197 Diarrhea, unspecified: Secondary | ICD-10-CM | POA: Diagnosis not present

## 2024-05-28 DIAGNOSIS — E440 Moderate protein-calorie malnutrition: Secondary | ICD-10-CM | POA: Diagnosis not present

## 2024-05-28 DIAGNOSIS — K648 Other hemorrhoids: Secondary | ICD-10-CM | POA: Diagnosis not present

## 2024-05-28 DIAGNOSIS — K529 Noninfective gastroenteritis and colitis, unspecified: Secondary | ICD-10-CM | POA: Diagnosis not present

## 2024-05-28 DIAGNOSIS — K5730 Diverticulosis of large intestine without perforation or abscess without bleeding: Secondary | ICD-10-CM | POA: Diagnosis not present

## 2024-05-28 DIAGNOSIS — R634 Abnormal weight loss: Secondary | ICD-10-CM | POA: Diagnosis not present

## 2024-05-28 DIAGNOSIS — I129 Hypertensive chronic kidney disease with stage 1 through stage 4 chronic kidney disease, or unspecified chronic kidney disease: Secondary | ICD-10-CM | POA: Diagnosis not present

## 2024-05-28 DIAGNOSIS — R109 Unspecified abdominal pain: Secondary | ICD-10-CM | POA: Diagnosis not present

## 2024-05-28 DIAGNOSIS — R1013 Epigastric pain: Secondary | ICD-10-CM | POA: Diagnosis not present

## 2024-05-28 DIAGNOSIS — R112 Nausea with vomiting, unspecified: Secondary | ICD-10-CM | POA: Diagnosis not present

## 2024-05-28 DIAGNOSIS — K6389 Other specified diseases of intestine: Secondary | ICD-10-CM | POA: Diagnosis not present

## 2024-05-29 DIAGNOSIS — K648 Other hemorrhoids: Secondary | ICD-10-CM | POA: Diagnosis not present

## 2024-05-29 DIAGNOSIS — R1013 Epigastric pain: Secondary | ICD-10-CM | POA: Diagnosis not present

## 2024-05-29 DIAGNOSIS — R634 Abnormal weight loss: Secondary | ICD-10-CM | POA: Diagnosis not present

## 2024-05-29 DIAGNOSIS — K2900 Acute gastritis without bleeding: Secondary | ICD-10-CM | POA: Diagnosis not present

## 2024-05-29 DIAGNOSIS — K529 Noninfective gastroenteritis and colitis, unspecified: Secondary | ICD-10-CM | POA: Diagnosis not present

## 2024-05-29 DIAGNOSIS — K2950 Unspecified chronic gastritis without bleeding: Secondary | ICD-10-CM | POA: Diagnosis not present

## 2024-05-29 DIAGNOSIS — I129 Hypertensive chronic kidney disease with stage 1 through stage 4 chronic kidney disease, or unspecified chronic kidney disease: Secondary | ICD-10-CM | POA: Diagnosis not present

## 2024-05-29 DIAGNOSIS — F411 Generalized anxiety disorder: Secondary | ICD-10-CM | POA: Diagnosis not present

## 2024-05-29 DIAGNOSIS — K5730 Diverticulosis of large intestine without perforation or abscess without bleeding: Secondary | ICD-10-CM | POA: Diagnosis not present

## 2024-05-29 DIAGNOSIS — E440 Moderate protein-calorie malnutrition: Secondary | ICD-10-CM | POA: Diagnosis not present

## 2024-05-29 DIAGNOSIS — F331 Major depressive disorder, recurrent, moderate: Secondary | ICD-10-CM | POA: Diagnosis not present

## 2024-05-29 DIAGNOSIS — K6389 Other specified diseases of intestine: Secondary | ICD-10-CM | POA: Diagnosis not present

## 2024-05-30 DIAGNOSIS — R634 Abnormal weight loss: Secondary | ICD-10-CM | POA: Diagnosis not present

## 2024-05-30 DIAGNOSIS — I129 Hypertensive chronic kidney disease with stage 1 through stage 4 chronic kidney disease, or unspecified chronic kidney disease: Secondary | ICD-10-CM | POA: Diagnosis not present

## 2024-05-30 DIAGNOSIS — E440 Moderate protein-calorie malnutrition: Secondary | ICD-10-CM | POA: Diagnosis not present

## 2024-05-30 DIAGNOSIS — K529 Noninfective gastroenteritis and colitis, unspecified: Secondary | ICD-10-CM | POA: Diagnosis not present

## 2024-05-30 DIAGNOSIS — Z941 Heart transplant status: Secondary | ICD-10-CM | POA: Diagnosis not present

## 2024-05-31 ENCOUNTER — Telehealth: Payer: Self-pay | Admitting: *Deleted

## 2024-05-31 NOTE — Transitions of Care (Post Inpatient/ED Visit) (Signed)
 05/31/2024  Name: Shawn Calderon MRN: [id number] DOB: Mar 29, 1976  Today's TOC FU Call Status: Today's TOC FU Call Status:: Successful TOC FU Call Completed TOC FU Call Complete Date: 05/31/24 Patient's Name and Date of Birth confirmed.  Transition Care Management Follow-up Telephone Call Date of Discharge: 05/30/24 Discharge Facility: Other [facility]) Name of Other (Non[facility]) Discharge Facility: [facility] Type of Discharge: Inpatient Admission Primary Inpatient Discharge Diagnosis:: Diarrhea, unspecified type How have you been since you were released from the hospital?: Better Any questions or concerns?: No  Items Reviewed: Did you receive and understand the discharge instructions provided?: Yes Medications obtained,verified, and reconciled?: Yes (Medications Reviewed) Any new allergies since your discharge?: No Dietary orders reviewed?: Yes Type of Diet Ordered:: FAB diet/SIBO diet Do you have support at home?: Yes People in Home [RPT]: spouse Name of Support/Comfort Primary Source: Wife  Medications Reviewed Today: Medications Reviewed Today     Reviewed by Aura Leeds, RN (Registered Nurse) on 05/31/24 at 1132  Med List Status: <None>   Medication Order Taking? Sig Documenting Provider Last Dose Status Informant  acetaminophen  (TYLENOL ) 500 MG tablet [id number] Yes Take 500-1,000 mg by mouth as needed. [provider]  Active Self  aspirin EC 81 MG tablet [id number] Yes Take 1 tablet by mouth daily. [provider]  Active Self  benzonatate  (TESSALON ) 200 MG capsule [id number]  Take 1 capsule (200 mg total) by mouth 2 (two) times daily as needed for cough.  Patient not taking: Reported on 05/31/2024   Blair, Diane W, FNP  Active   Calcium  Carbonate-Vitamin D (OSCAL 500 D-3 PO) [id number] Yes Take 1 tablet by mouth daily. [provider]  Active Self  cetirizine  (ZYRTEC  ALLERGY) 10 MG tablet [id number]  Take 1 tablet (10 mg total) by mouth  daily.  Patient not taking: Reported on 05/31/2024   Yevette Hem, FNP  Active Self  citalopram  (CELEXA ) 40 MG tablet [id number] Yes Take 40 mg by mouth daily. [provider]  Active Self  clonazePAM  (KLONOPIN ) 0.5 MG tablet [id number] Yes Take 0.5 mg by mouth 2 (two) times daily. [provider]  Active Self  famotidine  (PEPCID ) 20 MG tablet [id number] Yes Take 20 mg by mouth 2 (two) times daily as needed for heartburn or indigestion. [provider]  Active   ibuprofen  (ADVIL ) 800 MG tablet [id number]  Take 1 tablet (800 mg total) by mouth every 8 (eight) hours as needed.  Patient not taking: Reported on 05/31/2024   Sim Dryer, MD  Active   magnesium  oxide (MAG-OX) 400 MG tablet [id number] Yes Take 400 mg by mouth daily. [provider]  Active Self  Multiple Vitamin (MULTIVITAMIN WITH MINERALS) TABS tablet [id number] Yes Take 1 tablet by mouth daily. [provider]  Active Self  oxyCODONE  (OXY IR/ROXICODONE ) 5 MG immediate release tablet [id number]  Take 1 tablet (5 mg total) by mouth every 6 (six) hours as needed for severe pain (pain score 7-10). Sim Dryer, MD  Active   pantoprazole (PROTONIX) 40 MG tablet [id number] Yes Take 40 mg by mouth 2 (two) times daily.  Patient taking differently: Take 40 mg by mouth daily.   [provider]  Active Self  predniSONE  (DELTASONE ) 5 MG tablet [id number] Yes Take 5 mg by mouth daily with breakfast. [provider]  Active Self  rifaximin  (XIFAXAN ) 550 MG TABS tablet [id number] Yes Take 550 mg by mouth 3 (three) times daily. [provider]  Active   rosuvastatin  (CRESTOR ) 40  MG tablet [id number] Yes Take 40 mg by mouth daily. [provider]  Active Self  tacrolimus  (PROGRAF ) 1 MG capsule [id number] Yes Take 2 mg by mouth 2 (two) times daily.   Patient taking differently: Take 2 mg by mouth 2 (two) times daily.    [provider]  Active Self  tadalafil  (CIALIS) 5 MG tablet [id number] Yes Take 5 mg by mouth daily. [provider]  Active Self  topiramate (TOPAMAX) 100 MG tablet [id number] Yes Take 100 mg by mouth daily. [provider]  Active Self            Home Care and Equipment/Supplies: Were Home Health Services Ordered?: No Any new equipment or medical supplies ordered?: No  Functional Questionnaire: Do you need assistance with bathing/showering or dressing?: No Do you need assistance with meal preparation?: No Do you need assistance with eating?: No Do you have difficulty maintaining continence: No Do you need assistance with getting out of bed/getting out of a chair/moving?: No Do you have difficulty managing or taking your medications?: No  Follow up appointments reviewed: PCP Follow-up appointment confirmed?: Yes Date of PCP follow-up appointment?: 06/12/24 Follow-up Provider[facility] Follow-up appointment confirmed?: Yes Date of Specialist follow-up appointment?: 06/02/24 Follow-Up Specialty Provider:: [facility] Do you need transportation to your follow-up appointment?: No Do you understand care options if your condition(s) worsen?: Yes-patient verbalized understanding  SDOH Interventions Today    Flowsheet Row Most Recent Value  SDOH Interventions   Food Insecurity Interventions Intervention Not Indicated  Housing Interventions Intervention Not Indicated  Transportation Interventions Intervention Not Indicated  Utilities Interventions Intervention Not Indicated   Message sent to [facility] requesting appointment on 06/12/24 be adjusted to include hospital follow up.  Arna Better RN, BSN [place]  Value-Based Care [facility] RN Care Manager [phone]

## 2024-06-12 ENCOUNTER — Inpatient Hospital Stay: Payer: Self-pay | Admitting: Nurse Practitioner

## 2024-06-21 DIAGNOSIS — R197 Diarrhea, unspecified: Secondary | ICD-10-CM | POA: Diagnosis not present

## 2024-06-21 DIAGNOSIS — Z2989 Encounter for other specified prophylactic measures: Secondary | ICD-10-CM | POA: Diagnosis not present

## 2024-06-21 DIAGNOSIS — T86290 Cardiac allograft vasculopathy: Secondary | ICD-10-CM | POA: Diagnosis not present

## 2024-06-21 DIAGNOSIS — Y830 Surgical operation with transplant of whole organ as the cause of abnormal reaction of the patient, or of later complication, without mention of misadventure at the time of the procedure: Secondary | ICD-10-CM | POA: Diagnosis not present

## 2024-06-21 DIAGNOSIS — Z79899 Other long term (current) drug therapy: Secondary | ICD-10-CM | POA: Diagnosis not present

## 2024-06-21 DIAGNOSIS — Z48298 Encounter for aftercare following other organ transplant: Secondary | ICD-10-CM | POA: Diagnosis not present

## 2024-06-21 DIAGNOSIS — Z941 Heart transplant status: Secondary | ICD-10-CM | POA: Diagnosis not present

## 2024-08-18 DIAGNOSIS — R6881 Early satiety: Secondary | ICD-10-CM | POA: Diagnosis not present

## 2024-08-18 DIAGNOSIS — R140 Abdominal distension (gaseous): Secondary | ICD-10-CM | POA: Diagnosis not present

## 2024-08-18 DIAGNOSIS — R110 Nausea: Secondary | ICD-10-CM | POA: Diagnosis not present

## 2024-09-01 DIAGNOSIS — F411 Generalized anxiety disorder: Secondary | ICD-10-CM | POA: Diagnosis not present

## 2024-09-01 DIAGNOSIS — F330 Major depressive disorder, recurrent, mild: Secondary | ICD-10-CM | POA: Diagnosis not present

## 2024-09-01 DIAGNOSIS — F339 Major depressive disorder, recurrent, unspecified: Secondary | ICD-10-CM | POA: Diagnosis not present

## 2024-09-01 DIAGNOSIS — F331 Major depressive disorder, recurrent, moderate: Secondary | ICD-10-CM | POA: Diagnosis not present

## 2024-09-11 ENCOUNTER — Emergency Department (HOSPITAL_COMMUNITY)
Admission: EM | Admit: 2024-09-11 | Discharge: 2024-09-12 | Disposition: A | Discharge status: Home / Self Care | Attending: Emergency Medicine | Admitting: Emergency Medicine

## 2024-09-11 ENCOUNTER — Encounter (HOSPITAL_COMMUNITY): Payer: Self-pay

## 2024-09-11 ENCOUNTER — Emergency Department (HOSPITAL_COMMUNITY)

## 2024-09-11 ENCOUNTER — Other Ambulatory Visit: Payer: Self-pay

## 2024-09-11 DIAGNOSIS — R7989 Other specified abnormal findings of blood chemistry: Secondary | ICD-10-CM | POA: Insufficient documentation

## 2024-09-11 DIAGNOSIS — N189 Chronic kidney disease, unspecified: Secondary | ICD-10-CM | POA: Diagnosis not present

## 2024-09-11 DIAGNOSIS — K5900 Constipation, unspecified: Secondary | ICD-10-CM | POA: Diagnosis not present

## 2024-09-11 DIAGNOSIS — S39012A Strain of muscle, fascia and tendon of lower back, initial encounter: Secondary | ICD-10-CM | POA: Diagnosis not present

## 2024-09-11 DIAGNOSIS — M5441 Lumbago with sciatica, right side: Secondary | ICD-10-CM | POA: Diagnosis not present

## 2024-09-11 DIAGNOSIS — M545 Low back pain, unspecified: Secondary | ICD-10-CM | POA: Diagnosis not present

## 2024-09-11 DIAGNOSIS — I130 Hypertensive heart and chronic kidney disease with heart failure and stage 1 through stage 4 chronic kidney disease, or unspecified chronic kidney disease: Secondary | ICD-10-CM | POA: Insufficient documentation

## 2024-09-11 DIAGNOSIS — M549 Dorsalgia, unspecified: Secondary | ICD-10-CM | POA: Diagnosis not present

## 2024-09-11 DIAGNOSIS — Z79899 Other long term (current) drug therapy: Secondary | ICD-10-CM | POA: Insufficient documentation

## 2024-09-11 DIAGNOSIS — Z7982 Long term (current) use of aspirin: Secondary | ICD-10-CM | POA: Diagnosis not present

## 2024-09-11 DIAGNOSIS — M62830 Muscle spasm of back: Secondary | ICD-10-CM | POA: Diagnosis not present

## 2024-09-11 DIAGNOSIS — I509 Heart failure, unspecified: Secondary | ICD-10-CM | POA: Diagnosis not present

## 2024-09-11 MED ORDER — LIDOCAINE 5 % EX PTCH
3.0000 | MEDICATED_PATCH | CUTANEOUS | Status: DC
  Administered 2024-09-12: 3 via TRANSDERMAL
  Filled 2024-09-11: qty 3

## 2024-09-11 MED ORDER — DEXAMETHASONE SODIUM PHOSPHATE 4 MG/ML IJ SOLN
4.0000 mg | Freq: Once | INTRAMUSCULAR | Status: AC
Start: 2024-09-11 — End: 2024-09-12
  Administered 2024-09-12: 4 mg via INTRAVENOUS
  Filled 2024-09-11: qty 1

## 2024-09-11 NOTE — ED Triage Notes (Signed)
 Pt BIBA from home w/ cc of back pain after playing tennis on yesterday, Took home dose of Tramadol w/ no relief. Pain has increasingly gotten worse affecting ADLS and difficulty walking. Went to UC today and received Flexeril. 2008 Heart Transplant at [facility].   70's-14-129/86-98%RA ET-35  18g L-AC Fentanyl -100mcg Ketamine-17mg 

## 2024-09-12 ENCOUNTER — Emergency Department (HOSPITAL_COMMUNITY)

## 2024-09-12 DIAGNOSIS — R262 Difficulty in walking, not elsewhere classified: Secondary | ICD-10-CM | POA: Diagnosis not present

## 2024-09-12 DIAGNOSIS — R109 Unspecified abdominal pain: Secondary | ICD-10-CM | POA: Diagnosis not present

## 2024-09-12 DIAGNOSIS — M48061 Spinal stenosis, lumbar region without neurogenic claudication: Secondary | ICD-10-CM | POA: Diagnosis not present

## 2024-09-12 DIAGNOSIS — M5136 Other intervertebral disc degeneration, lumbar region with discogenic back pain only: Secondary | ICD-10-CM | POA: Diagnosis not present

## 2024-09-12 DIAGNOSIS — K5730 Diverticulosis of large intestine without perforation or abscess without bleeding: Secondary | ICD-10-CM | POA: Diagnosis not present

## 2024-09-12 DIAGNOSIS — M549 Dorsalgia, unspecified: Secondary | ICD-10-CM | POA: Diagnosis not present

## 2024-09-12 LAB — CBC WITH DIFFERENTIAL/PLATELET
Abs Immature Granulocytes: 0.04 K/uL (ref 0.00–0.07)
Basophils Absolute: 0 K/uL (ref 0.0–0.1)
Basophils Relative: 0 %
Eosinophils Absolute: 0 K/uL (ref 0.0–0.5)
Eosinophils Relative: 0 %
HCT: 40.1 % (ref 39.0–52.0)
Hemoglobin: 13.2 g/dL (ref 13.0–17.0)
Immature Granulocytes: 1 %
Lymphocytes Relative: 25 %
Lymphs Abs: 1.9 K/uL (ref 0.7–4.0)
MCH: 29.7 pg (ref 26.0–34.0)
MCHC: 32.9 g/dL (ref 30.0–36.0)
MCV: 90.1 fL (ref 80.0–100.0)
Monocytes Absolute: 0.2 K/uL (ref 0.1–1.0)
Monocytes Relative: 3 %
Neutro Abs: 5.6 K/uL (ref 1.7–7.7)
Neutrophils Relative %: 71 %
Platelets: 205 K/uL (ref 150–400)
RBC: 4.45 MIL/uL (ref 4.22–5.81)
RDW: 12.7 % (ref 11.5–15.5)
WBC: 7.8 K/uL (ref 4.0–10.5)
nRBC: 0 % (ref 0.0–0.2)

## 2024-09-12 LAB — URINALYSIS, ROUTINE W REFLEX MICROSCOPIC
Bilirubin Urine: NEGATIVE
Glucose, UA: NEGATIVE mg/dL
Hgb urine dipstick: NEGATIVE
Ketones, ur: NEGATIVE mg/dL
Leukocytes,Ua: NEGATIVE
Nitrite: NEGATIVE
Protein, ur: NEGATIVE mg/dL
Specific Gravity, Urine: 1.009 (ref 1.005–1.030)
pH: 7 (ref 5.0–8.0)

## 2024-09-12 LAB — BASIC METABOLIC PANEL WITH GFR
Anion gap: 12 (ref 5–15)
BUN: 23 mg/dL — ABNORMAL HIGH (ref 6–20)
CO2: 19 mmol/L — ABNORMAL LOW (ref 22–32)
Calcium: 9.2 mg/dL (ref 8.9–10.3)
Chloride: 108 mmol/L (ref 98–111)
Creatinine, Ser: 1.27 mg/dL — ABNORMAL HIGH (ref 0.61–1.24)
GFR, Estimated: >60 mL/min (ref >60)
Glucose, Bld: 166 mg/dL — ABNORMAL HIGH (ref 70–99)
Potassium: 3.9 mmol/L (ref 3.5–5.1)
Sodium: 139 mmol/L (ref 135–145)

## 2024-09-12 MED ORDER — PREDNISONE 20 MG PO TABS: ORAL_TABLET | ORAL | 0 refills | Status: DC

## 2024-09-12 MED ORDER — LIDOCAINE 5 % EX PTCH: 1.0000 | MEDICATED_PATCH | CUTANEOUS | 0 refills | Status: DC

## 2024-09-12 NOTE — Discharge Instructions (Addendum)
 Miralax one capful 3 times a day for 7 days and then daily thereafter

## 2024-09-12 NOTE — ED Provider Notes (Signed)
 [place] EMERGENCY DEPARTMENT AT [facility] Provider Note   CSN: [id number] Arrival date & time: 09/11/24  2258     Patient presents with: Back Pain   Shawn Calderon is a 48 y.o. male.   The history is provided by the patient.  Back Pain Location:  Sacro-iliac joint Quality:  Shooting Radiates to:  R posterior upper leg Pain severity:  Severe Pain is:  Same all the time Onset quality:  Sudden Duration:  1 day Timing:  Constant Progression:  Unchanged Chronicity:  New Context comment:  Playing tennis Relieved by:  Nothing Worsened by:  Nothing Ineffective treatments:  None tried Associated symptoms: no bladder incontinence, no bowel incontinence, no fever, no perianal numbness, no tingling and no weakness   Risk factors: no recent surgery and no vascular disease   Patient with anxiety and remote cardiac transplant only on 5 mg a day of prednisone  to prevent rejection presents with sudden onset low back pain radiating down the right leg post playing tennis.      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 Authorizing Provider  acetaminophen  (TYLENOL ) 500 MG tablet Take 500-1,000 mg by mouth as needed. 05/22/22  Yes [provider]  aspirin EC 81 MG tablet Take 1 tablet by mouth daily.   Yes [provider]  Calcium  Carbonate-Vitamin D (OSCAL 500 D-3 PO) Take 1 tablet by mouth daily.   Yes [provider]  citalopram  (CELEXA ) 40 MG tablet Take 40 mg by mouth daily.   Yes [provider]  cyclobenzaprine (FLEXERIL) 10 MG tablet Take 10 mg by mouth 3 (three) times daily as needed. 09/11/24 09/21/24 Yes [provider]  diazepam  (VALIUM) 5 MG tablet Take 5 mg by mouth 3 (three) times daily as needed. 09/01/24  Yes [provider]  Eszopiclone 3 MG TABS Take 3 mg by mouth at bedtime as needed.   Yes [provider]  lidocaine  (LIDODERM ) 5 % Place 1 patch onto the skin daily. Remove & Discard patch within 12 hours or as directed by MD 09/12/24  Yes Paublo Warshawsky, MD  magnesium  oxide (MAG-OX) 400 MG tablet Take 400 mg by mouth daily. 12/25/10  Yes [provider]  Multiple Vitamin (MULTIVITAMIN WITH MINERALS) TABS tablet Take 1 tablet by mouth daily.   Yes [provider]  ondansetron  (ZOFRAN ) 8 MG tablet Take 8 mg by mouth every 8 (eight) hours as needed. 08/09/24  Yes [provider]  pantoprazole (PROTONIX) 40 MG tablet Take 40 mg by mouth 2 (two) times daily.   Yes [provider]  predniSONE  (DELTASONE ) 20 MG tablet 3 tabs po day one, then 2 po daily x 4 days 09/12/24  Yes Betsabe Iglesia, MD  predniSONE  (DELTASONE ) 5 MG tablet Take 5 mg by mouth daily with breakfast.   Yes [provider]  rosuvastatin  (CRESTOR ) 40 MG tablet Take 40 mg by mouth daily.   Yes [provider]  tacrolimus  (PROGRAF ) 1 MG capsule Take 2 mg by mouth 2 (two) times daily.  09/01/18  Yes [provider]  traMADol (ULTRAM) 50 MG tablet  Take 50 mg by mouth every 6 (six) hours as needed.   Yes [provider]  benzonatate  (TESSALON ) 200 MG capsule Take 1 capsule (200 mg total) by mouth 2 (two) times daily as needed for cough. Patient not taking: No sig reported 04/14/24   Blair, Diane W, FNP  cetirizine  (ZYRTEC  ALLERGY) 10 MG tablet Take 1 tablet (10 mg total) by mouth daily. Patient not taking: No sig reported 03/01/22   Lavell Lye A, FNP  ibuprofen  (ADVIL ) 800 MG tablet Take 1 tablet (800 mg total) by mouth every 8 (eight) hours as needed. Patient not taking: No sig reported 01/03/24   Vanderbilt Ned, MD  oxyCODONE  (OXY IR/ROXICODONE ) 5 MG immediate release tablet  Take 1 tablet (5 mg total) by mouth every 6 (six) hours as needed for severe pain (pain score 7-10). 01/03/24   Cornett, Ned, MD    Allergies: Sulfamethoxazole, Sulfa antibiotics, Amoxicillin -pot clavulanate, Clindamycin, Reglan [metoclopramide], Sirolimus, and Compazine [prochlorperazine]    Review of Systems  Constitutional:  Negative for fever.  Gastrointestinal:  Negative for bowel incontinence.  Genitourinary:  Negative for bladder incontinence.  Musculoskeletal:  Positive for back pain.  Neurological:  Negative for tingling and weakness.  All other systems reviewed and are negative.   Updated Vital Signs BP (!) 149/90 (BP Location: Right Arm)   Pulse 65   Temp 97.9 F (36.6 C) (Oral)   Resp 17   Ht 5' 10 (1.778 m)   Wt 83.9 kg   SpO2 95%   BMI 26.54 kg/m   Physical Exam Vitals and nursing note reviewed.  Constitutional:      General: He is not in acute distress.    Appearance: He is well-developed. He is not diaphoretic.  HENT:     Head: Normocephalic and atraumatic.     Nose: Nose normal.  Eyes:     Extraocular Movements: Extraocular movements intact.     Conjunctiva/sclera: Conjunctivae normal.     Pupils: Pupils are equal, round, and reactive to light.  Cardiovascular:     Rate and Rhythm: Normal rate and regular rhythm.     Pulses: Normal pulses.     Heart sounds: Normal heart sounds.  Pulmonary:     Effort: Pulmonary effort is normal.     Breath sounds: Normal breath sounds. No wheezing or rales.  Abdominal:     General: Bowel sounds are normal.     Palpations: Abdomen is soft.     Tenderness: There is no abdominal tenderness. There is no guarding or rebound.  Musculoskeletal:        General: Normal range of motion.     Cervical back: Normal, normal range of motion and neck supple.     Thoracic back: Normal.     Lumbar back: Normal.  Skin:    General: Skin is warm and dry.     Capillary Refill: Capillary refill takes less than 2 seconds.   Neurological:     General: No focal deficit present.     Mental Status: He is alert and oriented to person, place, and time.     Motor: No weakness.     Deep Tendon Reflexes: Reflexes normal.     Comments: 5/5 BLE strength 2+DTRs      (all labs ordered are listed, but only abnormal results are displayed) Results for orders placed or performed during the hospital encounter of 09/11/24  CBC with Differential   Collection Time: 09/11/24 11:37 PM  Result Value Ref Range   WBC  7.8 4.0 - 10.5 K/uL   RBC 4.45 4.22 - 5.81 MIL/uL   Hemoglobin 13.2 13.0 - 17.0 g/dL   HCT 59.8 60.9 - 47.9 %   MCV 90.1 80.0 - 100.0 fL   MCH 29.7 26.0 - 34.0 pg   MCHC 32.9 30.0 - 36.0 g/dL   RDW 87.2 88.4 - 84.4 %   Platelets 205 150 - 400 K/uL   nRBC 0.0 0.0 - 0.2 %   Neutrophils Relative % 71 %   Neutro Abs 5.6 1.7 - 7.7 K/uL   Lymphocytes Relative 25 %   Lymphs Abs 1.9 0.7 - 4.0 K/uL   Monocytes Relative 3 %   Monocytes Absolute 0.2 0.1 - 1.0 K/uL   Eosinophils Relative 0 %   Eosinophils Absolute 0.0 0.0 - 0.5 K/uL   Basophils Relative 0 %   Basophils Absolute 0.0 0.0 - 0.1 K/uL   Immature Granulocytes 1 %   Abs Immature Granulocytes 0.04 0.00 - 0.07 K/uL  Basic metabolic panel   Collection Time: 09/11/24 11:37 PM  Result Value Ref Range   Sodium 139 135 - 145 mmol/L   Potassium 3.9 3.5 - 5.1 mmol/L   Chloride 108 98 - 111 mmol/L   CO2 19 (L) 22 - 32 mmol/L   Glucose, Bld 166 (H) 70 - 99 mg/dL   BUN 23 (H) 6 - 20 mg/dL   Creatinine, Ser 8.72 (H) 0.61 - 1.24 mg/dL   Calcium  9.2 8.9 - 10.3 mg/dL   GFR, Estimated >39 >39 mL/min   Anion gap 12 5 - 15  Urinalysis, Routine w reflex microscopic -Urine, Clean Catch   Collection Time: 09/12/24 12:10 AM  Result Value Ref Range   Color, Urine STRAW (A) YELLOW   APPearance CLEAR CLEAR   Specific Gravity, Urine 1.009 1.005 - 1.030   pH 7.0 5.0 - 8.0   Glucose, UA NEGATIVE NEGATIVE mg/dL   Hgb urine dipstick NEGATIVE NEGATIVE   Bilirubin Urine  NEGATIVE NEGATIVE   Ketones, ur NEGATIVE NEGATIVE mg/dL   Protein, ur NEGATIVE NEGATIVE mg/dL   Nitrite NEGATIVE NEGATIVE   Leukocytes,Ua NEGATIVE NEGATIVE   CT L-SPINE NO CHARGE Result Date: 09/12/2024 EXAM: CT OF THE LUMBAR SPINE WITHOUT CONTRAST 09/12/2024 01:22:33 AM TECHNIQUE: CT of the lumbar spine was performed without the administration of intravenous contrast. Multiplanar reformatted images are provided for review. Automated exposure control, iterative reconstruction, and/or weight based adjustment of the mA/kV was utilized to reduce the radiation dose to as low as reasonably achievable. COMPARISON: None available. CLINICAL HISTORY: Back pain after playing tennis yesterday. Took home dose of Tramadol with no relief. Pain has increasingly gotten worse affecting ADLs and difficulty walking. Went to UC today and received Flexeril. FINDINGS: BONES AND ALIGNMENT: Normal vertebral body heights. No acute fracture or suspicious bone lesion. Normal alignment. DEGENERATIVE CHANGES: Minimal multilevel spondylosis. Intervertebral disc space height is maintained. At L4-L5, there is moderate neural foraminal narrowing on the right. A posterior disc bulge at L4-L5 causes moderate spinal canal narrowing. No severe spinal canal or neural foraminal narrowing. SOFT TISSUES: No acute abnormality. IMPRESSION: 1. No acute abnormality in the lumbar spine. Electronically signed by: Norman Gatlin MD 09/12/2024 01:42 AM EDT RP Workstation: [id number]   CT Renal Stone Study Result Date: 09/12/2024 EXAM: CT UROGRAM 09/12/2024 01:22:33 AM TECHNIQUE: CT of the abdomen and pelvis was performed without the administration of intravenous contrast. Multiplanar reformatted images as well as MIP urogram images are provided for review. Automated exposure control, iterative reconstruction, and/or  weight based adjustment of the mA/kV was utilized to reduce the radiation dose to as low as reasonably achievable. COMPARISON: CT abdomen  and pelvis 04/01/2020. CLINICAL HISTORY: Abdominal/flank pain, stone suspected. Back pain after playing tennis yesterday. Took home dose of Tramadol with no relief. Pain has increasingly gotten worse affecting ADLs and difficulty walking. Went to UC today and received Flexeril. FINDINGS: LOWER CHEST: No acute abnormality. LIVER: The liver is unremarkable. GALLBLADDER AND BILE DUCTS: Gallbladder is unremarkable. No biliary ductal dilatation. SPLEEN: No acute abnormality. PANCREAS: No acute abnormality. ADRENAL GLANDS: No acute abnormality. KIDNEYS, URETERS AND BLADDER: No stones in the kidneys or ureters. No hydronephrosis. No perinephric or periureteral stranding. Urinary bladder is unremarkable. GI AND BOWEL: Stomach demonstrates no acute abnormality. There is no bowel obstruction. Colonic diverticulosis without diverticulitis. Moderate to large colonic stool burden. PERITONEUM AND RETROPERITONEUM: No ascites. No free air. VASCULATURE: Aorta is normal in caliber. LYMPH NODES: No lymphadenopathy. REPRODUCTIVE ORGANS: No acute abnormality. BONES AND SOFT TISSUES: No acute osseous abnormality. No focal soft tissue abnormality. IMPRESSION: 1. No acute abnormality. 2. Moderate to large colonic stool burden. Correlate for constipation. Electronically signed by: Norman Gatlin MD 09/12/2024 01:38 AM EDT RP Workstation: [id number]    EKG: None  Radiology: CT L-SPINE NO CHARGE Result Date: 09/12/2024 EXAM: CT OF THE LUMBAR SPINE WITHOUT CONTRAST 09/12/2024 01:22:33 AM TECHNIQUE: CT of the lumbar spine was performed without the administration of intravenous contrast. Multiplanar reformatted images are provided for review. Automated exposure control, iterative reconstruction, and/or weight based adjustment of the mA/kV was utilized to reduce the radiation dose to as low as reasonably achievable. COMPARISON: None available. CLINICAL HISTORY: Back pain after playing tennis yesterday. Took home dose of Tramadol with no  relief. Pain has increasingly gotten worse affecting ADLs and difficulty walking. Went to UC today and received Flexeril. FINDINGS: BONES AND ALIGNMENT: Normal vertebral body heights. No acute fracture or suspicious bone lesion. Normal alignment. DEGENERATIVE CHANGES: Minimal multilevel spondylosis. Intervertebral disc space height is maintained. At L4-L5, there is moderate neural foraminal narrowing on the right. A posterior disc bulge at L4-L5 causes moderate spinal canal narrowing. No severe spinal canal or neural foraminal narrowing. SOFT TISSUES: No acute abnormality. IMPRESSION: 1. No acute abnormality in the lumbar spine. Electronically signed by: Norman Gatlin MD 09/12/2024 01:42 AM EDT RP Workstation: [id number]   CT Renal Stone Study Result Date: 09/12/2024 EXAM: CT UROGRAM 09/12/2024 01:22:33 AM TECHNIQUE: CT of the abdomen and pelvis was performed without the administration of intravenous contrast. Multiplanar reformatted images as well as MIP urogram images are provided for review. Automated exposure control, iterative reconstruction, and/or weight based adjustment of the mA/kV was utilized to reduce the radiation dose to as low as reasonably achievable. COMPARISON: CT abdomen and pelvis 04/01/2020. CLINICAL HISTORY: Abdominal/flank pain, stone suspected. Back pain after playing tennis yesterday. Took home dose of Tramadol with no relief. Pain has increasingly gotten worse affecting ADLs and difficulty walking. Went to UC today and received Flexeril. FINDINGS: LOWER CHEST: No acute abnormality. LIVER: The liver is unremarkable. GALLBLADDER AND BILE DUCTS: Gallbladder is unremarkable. No biliary ductal dilatation. SPLEEN: No acute abnormality. PANCREAS: No acute abnormality. ADRENAL GLANDS: No acute abnormality. KIDNEYS, URETERS AND BLADDER: No stones in the kidneys or ureters. No hydronephrosis. No perinephric or periureteral stranding. Urinary bladder is unremarkable. GI AND BOWEL: Stomach  demonstrates no acute abnormality. There is no bowel obstruction. Colonic diverticulosis without diverticulitis. Moderate to large colonic stool burden. PERITONEUM AND RETROPERITONEUM: No ascites. No free air.  VASCULATURE: Aorta is normal in caliber. LYMPH NODES: No lymphadenopathy. REPRODUCTIVE ORGANS: No acute abnormality. BONES AND SOFT TISSUES: No acute osseous abnormality. No focal soft tissue abnormality. IMPRESSION: 1. No acute abnormality. 2. Moderate to large colonic stool burden. Correlate for constipation. Electronically signed by: Norman Gatlin MD 09/12/2024 01:38 AM EDT RP Workstation: [id number]     Procedures   Medications Ordered in the ED  lidocaine  (LIDODERM ) 5 % 3 patch (3 patches Transdermal Patch Applied 09/12/24 0002)  dexamethasone  (DECADRON ) injection 4 mg (4 mg Intravenous Given 09/12/24 0003)                                    Medical Decision Making Patient with sciatica given fentanyl  and ketamine by EMS  Amount and/or Complexity of Data Reviewed Independent Historian: EMS    Details: See above  External Data Reviewed: notes.    Details: Previous notes reviewed  Labs: ordered.    Details: Urine without uti.  Normal sodiun 137, normal potassium 3.7, creatinine elevated 1.27. normal white count 7.8  Radiology: ordered and independent interpretation performed.    Details: Constipation on CT  Risk Prescription drug management. Risk Details: No midline tenderness. No saddle anesthesia.  Is urinating normally.  CTs are normal.  No non verbal cues of pain.  Symptoms are consistent with sciatica.  Sleeping in room upon reevaluation.  Follow up with your PMD.  Will start steroids and lidoderm .  Will start miralax 3 time a day for sever constipation.  Continue the muscle relaxant started today at urgent care.      Final diagnoses:  Acute right-sided low back pain with right-sided sciatica  Constipation, unspecified constipation type   No signs of systemic  illness or infection. The patient is nontoxic-appearing on exam and vital signs are within normal limits.  I have reviewed the triage vital signs and the nursing notes. Pertinent labs & imaging results that were available during my care of the patient were reviewed by me and considered in my medical decision making (see chart for details). After history, exam, and medical workup I feel the patient has been appropriately medically screened and is safe for discharge home. Pertinent diagnoses were discussed with the patient. Patient was given return precautions.  ED Discharge Orders          Ordered    predniSONE  (DELTASONE ) 20 MG tablet        09/12/24 0215    lidocaine  (LIDODERM ) 5 %  Every 24 hours        09/12/24 0215               Carmencita Cusic, MD 09/12/24 [id number]

## 2024-09-15 DIAGNOSIS — I119 Hypertensive heart disease without heart failure: Secondary | ICD-10-CM | POA: Diagnosis not present

## 2024-09-15 DIAGNOSIS — Z48298 Encounter for aftercare following other organ transplant: Secondary | ICD-10-CM | POA: Diagnosis not present

## 2024-09-15 DIAGNOSIS — D849 Immunodeficiency, unspecified: Secondary | ICD-10-CM | POA: Diagnosis not present

## 2024-09-15 DIAGNOSIS — Z941 Heart transplant status: Secondary | ICD-10-CM | POA: Diagnosis not present

## 2024-09-15 DIAGNOSIS — Z79621 Long term (current) use of calcineurin inhibitor: Secondary | ICD-10-CM | POA: Diagnosis not present

## 2024-09-15 DIAGNOSIS — I351 Nonrheumatic aortic (valve) insufficiency: Secondary | ICD-10-CM | POA: Diagnosis not present

## 2024-09-15 DIAGNOSIS — Z7952 Long term (current) use of systemic steroids: Secondary | ICD-10-CM | POA: Diagnosis not present

## 2024-09-15 DIAGNOSIS — Z4821 Encounter for aftercare following heart transplant: Secondary | ICD-10-CM | POA: Diagnosis not present

## 2024-09-18 DIAGNOSIS — R351 Nocturia: Secondary | ICD-10-CM | POA: Diagnosis not present

## 2024-09-18 DIAGNOSIS — N401 Enlarged prostate with lower urinary tract symptoms: Secondary | ICD-10-CM | POA: Diagnosis not present

## 2024-09-26 DIAGNOSIS — Z48298 Encounter for aftercare following other organ transplant: Secondary | ICD-10-CM | POA: Diagnosis not present

## 2024-09-26 DIAGNOSIS — D849 Immunodeficiency, unspecified: Secondary | ICD-10-CM | POA: Diagnosis not present

## 2024-09-26 DIAGNOSIS — Z941 Heart transplant status: Secondary | ICD-10-CM | POA: Diagnosis not present

## 2024-11-16 DIAGNOSIS — F330 Major depressive disorder, recurrent, mild: Secondary | ICD-10-CM | POA: Diagnosis not present

## 2024-11-16 DIAGNOSIS — F411 Generalized anxiety disorder: Secondary | ICD-10-CM | POA: Diagnosis not present

## 2024-11-16 DIAGNOSIS — F331 Major depressive disorder, recurrent, moderate: Secondary | ICD-10-CM | POA: Diagnosis not present

## 2024-11-16 DIAGNOSIS — F339 Major depressive disorder, recurrent, unspecified: Secondary | ICD-10-CM | POA: Diagnosis not present

## 2025-01-05 ENCOUNTER — Other Ambulatory Visit: Payer: Self-pay

## 2025-01-05 ENCOUNTER — Emergency Department (HOSPITAL_COMMUNITY): Payer: Self-pay

## 2025-01-05 ENCOUNTER — Encounter (HOSPITAL_COMMUNITY): Payer: Self-pay

## 2025-01-05 ENCOUNTER — Observation Stay (HOSPITAL_COMMUNITY)
Admission: EM | Admit: 2025-01-05 | Discharge: 2025-01-06 | Disposition: A | Payer: Self-pay | Attending: Internal Medicine | Admitting: Internal Medicine

## 2025-01-05 DIAGNOSIS — N400 Enlarged prostate without lower urinary tract symptoms: Secondary | ICD-10-CM

## 2025-01-05 DIAGNOSIS — K449 Diaphragmatic hernia without obstruction or gangrene: Secondary | ICD-10-CM | POA: Insufficient documentation

## 2025-01-05 DIAGNOSIS — R1110 Vomiting, unspecified: Secondary | ICD-10-CM | POA: Diagnosis present

## 2025-01-05 DIAGNOSIS — F429 Obsessive-compulsive disorder, unspecified: Secondary | ICD-10-CM | POA: Diagnosis present

## 2025-01-05 DIAGNOSIS — Z941 Heart transplant status: Secondary | ICD-10-CM

## 2025-01-05 DIAGNOSIS — I509 Heart failure, unspecified: Secondary | ICD-10-CM | POA: Insufficient documentation

## 2025-01-05 DIAGNOSIS — N179 Acute kidney failure, unspecified: Secondary | ICD-10-CM | POA: Insufficient documentation

## 2025-01-05 DIAGNOSIS — K222 Esophageal obstruction: Secondary | ICD-10-CM | POA: Insufficient documentation

## 2025-01-05 DIAGNOSIS — K219 Gastro-esophageal reflux disease without esophagitis: Secondary | ICD-10-CM | POA: Diagnosis present

## 2025-01-05 DIAGNOSIS — K529 Noninfective gastroenteritis and colitis, unspecified: Secondary | ICD-10-CM | POA: Diagnosis present

## 2025-01-05 DIAGNOSIS — I420 Dilated cardiomyopathy: Secondary | ICD-10-CM

## 2025-01-05 DIAGNOSIS — K920 Hematemesis: Principal | ICD-10-CM | POA: Diagnosis present

## 2025-01-05 DIAGNOSIS — I130 Hypertensive heart and chronic kidney disease with heart failure and stage 1 through stage 4 chronic kidney disease, or unspecified chronic kidney disease: Secondary | ICD-10-CM | POA: Insufficient documentation

## 2025-01-05 DIAGNOSIS — N182 Chronic kidney disease, stage 2 (mild): Secondary | ICD-10-CM | POA: Insufficient documentation

## 2025-01-05 DIAGNOSIS — T86290 Cardiac allograft vasculopathy: Secondary | ICD-10-CM

## 2025-01-05 DIAGNOSIS — Z79899 Other long term (current) drug therapy: Secondary | ICD-10-CM | POA: Insufficient documentation

## 2025-01-05 LAB — CBC WITH DIFFERENTIAL/PLATELET
Abs Immature Granulocytes: 0.03 K/uL (ref 0.00–0.07)
Basophils Absolute: 0 K/uL (ref 0.0–0.1)
Basophils Relative: 0 %
Eosinophils Absolute: 0 K/uL (ref 0.0–0.5)
Eosinophils Relative: 0 %
HCT: 50 % (ref 39.0–52.0)
Hemoglobin: 16.6 g/dL (ref 13.0–17.0)
Immature Granulocytes: 0 %
Lymphocytes Relative: 19 %
Lymphs Abs: 1.7 K/uL (ref 0.7–4.0)
MCH: 30.2 pg (ref 26.0–34.0)
MCHC: 33.2 g/dL (ref 30.0–36.0)
MCV: 90.9 fL (ref 80.0–100.0)
Monocytes Absolute: 0.5 K/uL (ref 0.1–1.0)
Monocytes Relative: 5 %
Neutro Abs: 7.1 K/uL (ref 1.7–7.7)
Neutrophils Relative %: 76 %
Platelets: 222 K/uL (ref 150–400)
RBC: 5.5 MIL/uL (ref 4.22–5.81)
RDW: 12.8 % (ref 11.5–15.5)
WBC: 9.3 K/uL (ref 4.0–10.5)
nRBC: 0 % (ref 0.0–0.2)

## 2025-01-05 LAB — COMPREHENSIVE METABOLIC PANEL WITH GFR
ALT: 27 U/L (ref 0–44)
AST: 32 U/L (ref 15–41)
Albumin: 4.5 g/dL (ref 3.5–5.0)
Alkaline Phosphatase: 112 U/L (ref 38–126)
Anion gap: 17 — ABNORMAL HIGH (ref 5–15)
BUN: 35 mg/dL — ABNORMAL HIGH (ref 6–20)
CO2: 21 mmol/L — ABNORMAL LOW (ref 22–32)
Calcium: 10.1 mg/dL (ref 8.9–10.3)
Chloride: 103 mmol/L (ref 98–111)
Creatinine, Ser: 1.54 mg/dL — ABNORMAL HIGH (ref 0.61–1.24)
GFR, Estimated: 55 mL/min — ABNORMAL LOW
Glucose, Bld: 115 mg/dL — ABNORMAL HIGH (ref 70–99)
Potassium: 4 mmol/L (ref 3.5–5.1)
Sodium: 140 mmol/L (ref 135–145)
Total Bilirubin: 0.9 mg/dL (ref 0.0–1.2)
Total Protein: 8.3 g/dL — ABNORMAL HIGH (ref 6.5–8.1)

## 2025-01-05 LAB — LIPASE, BLOOD: Lipase: 71 U/L — ABNORMAL HIGH (ref 11–51)

## 2025-01-05 LAB — TYPE AND SCREEN
ABO/RH(D): A POS
Antibody Screen: NEGATIVE

## 2025-01-05 MED ORDER — PREDNISONE 5 MG PO TABS
5.0000 mg | ORAL_TABLET | Freq: Every day | ORAL | Status: DC
  Filled 2025-01-05: qty 1

## 2025-01-05 MED ORDER — LACTATED RINGERS IV BOLUS
1000.0000 mL | Freq: Once | INTRAVENOUS | Status: AC
  Administered 2025-01-05: 1000 mL via INTRAVENOUS

## 2025-01-05 MED ORDER — DIAZEPAM 5 MG PO TABS
5.0000 mg | ORAL_TABLET | Freq: Three times a day (TID) | ORAL | Status: DC
  Administered 2025-01-05 (×3): 5 mg via ORAL
  Filled 2025-01-05 (×3): qty 1

## 2025-01-05 MED ORDER — PANTOPRAZOLE SODIUM 40 MG IV SOLR
40.0000 mg | Freq: Two times a day (BID) | INTRAVENOUS | Status: DC
  Administered 2025-01-05 – 2025-01-06 (×2): 40 mg via INTRAVENOUS
  Filled 2025-01-05 (×2): qty 10

## 2025-01-05 MED ORDER — ACETAMINOPHEN 500 MG PO TABS
1000.0000 mg | ORAL_TABLET | Freq: Three times a day (TID) | ORAL | Status: DC | PRN
  Administered 2025-01-05: 1000 mg via ORAL
  Filled 2025-01-05: qty 2

## 2025-01-05 MED ORDER — PANTOPRAZOLE SODIUM 40 MG IV SOLR: 80.0000 mg | Freq: Two times a day (BID) | INTRAVENOUS | Status: DC

## 2025-01-05 MED ORDER — IOHEXOL 350 MG/ML SOLN
75.0000 mL | Freq: Once | INTRAVENOUS | Status: AC | PRN
  Administered 2025-01-05: 75 mL via INTRAVENOUS

## 2025-01-05 MED ORDER — SODIUM CHLORIDE 0.9 % IV SOLN
12.5000 mg | Freq: Four times a day (QID) | INTRAVENOUS | Status: DC | PRN
  Administered 2025-01-05 (×2): 12.5 mg via INTRAVENOUS
  Filled 2025-01-05 (×2): qty 12.5

## 2025-01-05 MED ORDER — ZOLPIDEM TARTRATE 5 MG PO TABS: 5.0000 mg | ORAL_TABLET | Freq: Every evening | ORAL | Status: DC | PRN

## 2025-01-05 MED ORDER — MAGNESIUM OXIDE -MG SUPPLEMENT 400 (240 MG) MG PO TABS
400.0000 mg | ORAL_TABLET | Freq: Every day | ORAL | Status: DC
  Administered 2025-01-05: 400 mg via ORAL
  Filled 2025-01-05 (×3): qty 1

## 2025-01-05 MED ORDER — ADULT MULTIVITAMIN W/MINERALS CH
1.0000 | ORAL_TABLET | Freq: Every day | ORAL | Status: DC
  Administered 2025-01-05: 1 via ORAL
  Filled 2025-01-05 (×2): qty 1

## 2025-01-05 MED ORDER — OYSTER SHELL CALCIUM/D3 500-5 MG-MCG PO TABS
1.0000 | ORAL_TABLET | Freq: Every day | ORAL | Status: DC
  Filled 2025-01-05: qty 1

## 2025-01-05 MED ORDER — TADALAFIL 5 MG PO TABS
5.0000 mg | ORAL_TABLET | Freq: Every day | ORAL | Status: DC
  Administered 2025-01-05: 5 mg via ORAL
  Filled 2025-01-05 (×2): qty 1

## 2025-01-05 MED ORDER — PANTOPRAZOLE SODIUM 40 MG IV SOLR
80.0000 mg | Freq: Once | INTRAVENOUS | Status: AC
  Administered 2025-01-05: 80 mg via INTRAVENOUS
  Filled 2025-01-05: qty 20

## 2025-01-05 MED ORDER — ROSUVASTATIN CALCIUM 20 MG PO TABS
40.0000 mg | ORAL_TABLET | Freq: Every day | ORAL | Status: DC
  Administered 2025-01-05: 40 mg via ORAL
  Filled 2025-01-05 (×2): qty 2

## 2025-01-05 MED ORDER — SODIUM CHLORIDE (PF) 0.9 % IJ SOLN
INTRAMUSCULAR | Status: AC
  Administered 2025-01-05: 10 mL
  Filled 2025-01-05: qty 10

## 2025-01-05 MED ORDER — FAMOTIDINE IN NACL 20-0.9 MG/50ML-% IV SOLN
20.0000 mg | Freq: Once | INTRAVENOUS | Status: AC
  Administered 2025-01-05: 20 mg via INTRAVENOUS
  Filled 2025-01-05: qty 50

## 2025-01-05 MED ORDER — PROPOFOL 1000 MG/100ML IV EMUL
INTRAVENOUS | Status: AC
  Filled 2025-01-05: qty 100

## 2025-01-05 MED ORDER — TACROLIMUS 1 MG PO CAPS
2.0000 mg | ORAL_CAPSULE | Freq: Every day | ORAL | Status: DC
  Administered 2025-01-05: 2 mg via ORAL
  Filled 2025-01-05 (×2): qty 2

## 2025-01-05 MED ORDER — DIPHENHYDRAMINE HCL 50 MG/ML IJ SOLN: 25.0000 mg | Freq: Once | INTRAMUSCULAR | Status: DC | PRN

## 2025-01-05 MED ORDER — TOPIRAMATE 100 MG PO TABS
100.0000 mg | ORAL_TABLET | Freq: Every day | ORAL | Status: DC
  Administered 2025-01-05: 100 mg via ORAL
  Filled 2025-01-05: qty 1
  Filled 2025-01-05: qty 4

## 2025-01-05 MED ORDER — TACROLIMUS 1 MG PO CAPS
3.0000 mg | ORAL_CAPSULE | Freq: Every day | ORAL | Status: DC
  Administered 2025-01-05: 3 mg via ORAL
  Filled 2025-01-05 (×2): qty 3

## 2025-01-05 MED ORDER — CITALOPRAM HYDROBROMIDE 20 MG PO TABS
40.0000 mg | ORAL_TABLET | Freq: Every day | ORAL | Status: DC
  Administered 2025-01-05: 40 mg via ORAL
  Filled 2025-01-05: qty 2
  Filled 2025-01-05: qty 4

## 2025-01-05 MED ORDER — ONDANSETRON 4 MG PO TBDP: 4.0000 mg | ORAL_TABLET | Freq: Three times a day (TID) | ORAL | Status: DC | PRN

## 2025-01-05 MED ORDER — SODIUM CHLORIDE 0.9 % IV SOLN: 12.5000 mg | INTRAVENOUS | Status: DC | PRN

## 2025-01-05 NOTE — ED Provider Notes (Signed)
 " [place] EMERGENCY DEPARTMENT AT [facility] Provider Note   CSN: [id number] Arrival date & time: 01/05/25  9692     Patient presents with: Hematemesis   Shawn Calderon is a 49 y.o. male.  {Add pertinent medical, surgical, social history, OB history to HPI:32947} 49 yo M here with hematemesis for a few hours. Last ate around 1730. Bland food. Started having bloody emesis a couple hours later. No h/o same. Has h/o GERD on PPI. Also h/o heart transplant on prednisone , tacrolimus  and imuran for anti rejection. No signficant abdominal pain but does have back spasms/pain similar to when he pulled a muscle in the past. No blood in stool or dark stools. No diarrhea. No recent illnesses. Had a heart cath last week that was stable from the year piror. Had an EGD done 7 months ago without some mild irritation, biopsies sent. No etoh, drugs, h/o varices.         Prior to Admission medications  Medication Sig Start Date End Date Taking? Authorizing Provider  acetaminophen  (TYLENOL ) 500 MG tablet Take 500-1,000 mg by mouth as needed. 05/22/22   [provider]  aspirin EC 81 MG tablet Take 1 tablet by mouth daily.    [provider]  benzonatate  (TESSALON ) 200 MG capsule Take 1 capsule (200 mg total) by mouth 2 (two) times daily as needed for cough. Patient not taking: No sig reported 04/14/24   Almeda Loa ORN, FNP  Calcium  Carbonate-Vitamin D (OSCAL 500 D-3 PO) Take 1 tablet by mouth daily.    [provider]  cetirizine  (ZYRTEC  ALLERGY) 10 MG tablet Take 1 tablet (10 mg total) by mouth daily. Patient not taking: No sig reported 03/01/22   Lavell Lye A, FNP  citalopram  (CELEXA ) 40 MG tablet Take 40 mg by mouth daily.    [provider]  diazepam  (VALIUM ) 5 MG tablet Take 5 mg by mouth 3 (three) times daily as needed. 09/01/24   [provider]  Eszopiclone 3 MG TABS Take 3 mg by mouth at bedtime as needed.    [provider]   ibuprofen  (ADVIL ) 800 MG tablet Take 1 tablet (800 mg total) by mouth every 8 (eight) hours as needed. Patient not taking: No sig reported 01/03/24   Vanderbilt Ned, MD  lidocaine  (LIDODERM ) 5 % Place 1 patch onto the skin daily. Remove & Discard patch within 12 hours or as directed by MD 09/12/24   Nettie, April, MD  magnesium  oxide (MAG-OX) 400 MG tablet Take 400 mg by mouth daily. 12/25/10   [provider]  Multiple Vitamin (MULTIVITAMIN WITH MINERALS) TABS tablet Take 1 tablet by mouth daily.    [provider]  ondansetron  (ZOFRAN ) 8 MG tablet Take 8 mg by mouth every 8 (eight) hours as needed. 08/09/24   [provider]  oxyCODONE  (OXY IR/ROXICODONE ) 5 MG immediate release tablet Take 1 tablet (5 mg total) by mouth every 6 (six) hours as needed for severe pain (pain score 7-10). 01/03/24   Cornett, Ned, MD  pantoprazole  (PROTONIX ) 40 MG tablet Take 40 mg by mouth 2 (two) times daily.    [provider]  predniSONE  (DELTASONE ) 20 MG tablet 3 tabs po day one, then 2 po daily x 4 days 09/12/24   Palumbo, April, MD  predniSONE  (DELTASONE ) 5 MG tablet Take 5 mg by mouth daily with breakfast.    [provider]  rosuvastatin  (CRESTOR ) 40 MG tablet Take 40 mg by mouth daily.  [provider]  tacrolimus  (PROGRAF ) 1 MG capsule Take 2 mg by mouth 2 (two) times daily.  09/01/18   [provider]  traMADol (ULTRAM) 50 MG tablet Take 50 mg by mouth every 6 (six) hours as needed.    [provider]    Allergies: Sulfamethoxazole, Sulfa antibiotics, Amoxicillin -pot clavulanate, Clindamycin, Reglan [metoclopramide], Sirolimus, and Compazine [prochlorperazine]    Review of Systems  Updated Vital Signs Ht 5' 10 (1.778 m)   Wt 83.5 kg   BMI 26.40 kg/m   Physical Exam Vitals and nursing note reviewed.  Constitutional:      Appearance: He is well-developed.  HENT:     Head: Normocephalic and atraumatic.  Cardiovascular:      Rate and Rhythm: Normal rate.  Pulmonary:     Effort: Pulmonary effort is normal. No respiratory distress.  Abdominal:     General: There is no distension.  Musculoskeletal:        General: Normal range of motion.     Cervical back: Normal range of motion.  Neurological:     Mental Status: He is alert.     (all labs ordered are listed, but only abnormal results are displayed) Labs Reviewed  COMPREHENSIVE METABOLIC PANEL WITH GFR - Abnormal; Notable for the following components:      Result Value   CO2 21 (*)    Glucose, Bld 115 (*)    BUN 35 (*)    Creatinine, Ser 1.54 (*)    Total Protein 8.3 (*)    GFR, Estimated 55 (*)    Anion gap 17 (*)    All other components within normal limits  LIPASE, BLOOD - Abnormal; Notable for the following components:   Lipase 71 (*)    All other components within normal limits  CBC WITH DIFFERENTIAL/PLATELET  TYPE AND SCREEN    EKG: None  Radiology: No results found.  {Document cardiac monitor, telemetry assessment procedure when appropriate:32947} Procedures   Medications Ordered in the ED  famotidine  (PEPCID ) IVPB 20 mg premix (20 mg Intravenous New Bag/Given 01/05/25 0413)  promethazine  (PHENERGAN ) 12.5 mg in sodium chloride  0.9 % 50 mL IVPB (12.5 mg Intravenous New Bag/Given 01/05/25 0349)  diphenhydrAMINE  (BENADRYL ) injection 25 mg (has no administration in time range)  pantoprazole  (PROTONIX ) injection 80 mg (80 mg Intravenous Given 01/05/25 0338)  lactated ringers  bolus 1,000 mL (1,000 mLs Intravenous New Bag/Given 01/05/25 0346)      {Click here for ABCD2, HEART and other calculators REFRESH Note before signing:1}                              Medical Decision Making Amount and/or Complexity of Data Reviewed Labs: ordered. Radiology: ordered.  Risk Prescription drug management. Decision regarding hospitalization.  Suspect UGIB, protonix  given, fluids given, antiemetics given. Hemoglobin slightly high, likely concentrated  but definitely not needing transfusion. Symptoms improved significantly with phenergan  after zofran  didn't help. Will await rest of labs and likely admit for GI eval and consideration of endoscopy if not improving.  Workup reassuring. CT scan with fluid - likely blood. Consulted transplant team to ensure ok with admit here, pending call back.  ***  {Document critical care time when appropriate  Document review of labs and clinical decision tools ie CHADS2VASC2, etc  Document your independent review of radiology images and any outside records  Document your discussion with family members, caretakers and with consultants  Document social determinants of health affecting  pt's care  Document your decision making why or why not admission, treatments were needed:32947:::1}   Final diagnoses:  None    ED Discharge Orders     None        "

## 2025-01-05 NOTE — Plan of Care (Signed)
   Problem: Education: Goal: Knowledge of General Education information will improve Description Including pain rating scale, medication(s)/side effects and non-pharmacologic comfort measures Outcome: Progressing

## 2025-01-05 NOTE — ED Provider Notes (Signed)
 Clinical Course as of 01/05/25 0813  Fri Jan 05, 2025  0713 Came in for Hematemesis. Had gerd/gastritis in 2025. H/o of heart transplant on immunosuppressives. Awaiting callback from [facility] for possible transfer.  [TY]  [id number] Spoke with [facility] cardiology; Dr. Larnell Doles who think patient is appropriate to receive further GI workup here at [facility] as stable from transplant/cardiac standpoint. Had recent echo normal. Can call him directly at [phone] if any questions. Would not make any changes currently to immunosuppressive medications.  [TY]  [id number] Spoke with internal medicine team for admission. [TY]    Clinical Course User Index [TY] Neysa Caron PARAS, DO      Neysa Caron PARAS, [place] 01/05/25 [phone]

## 2025-01-05 NOTE — Consult Note (Signed)
 "                                               Consultation Note   Referring Provider:   Internal Medicine Teaching Service PCP: Paseda, Folashade R, FNP Primary Gastroenterologist:    Previously Norleen Kiang, MD, now [facility]      Reason for Consultation: Hematemesis DOA: 01/05/2025         Hospital Day: 1   Assessment::   # 49 year old male who is status post heart transplant at [facility] with chronic nausea and chronic diarrhea. Has had extensive workup ([facility] and [facility]). Diarrhea felt to be secondary to dumping syndrome. Etiology of chronic nausea unclear. Generally manages  symptoms well with digestive enzymes, PPI, small frequent meals, and agar gum.  Currently admitted with acute vomiting with hematemesis  Nausea /vomiting could be exacerbation of chronic nausea but also consider gastroenteritis with fluid throughout stomach / intestines on CT scan. Timing of the bloody emesis suspicious for Mallory-Weiss tear  Hemoglobin is normal at 16 though suspect hemoconcentrated from vomiting.  Baseline hemoglobin around 13.5.  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 after midnight --EGD tomorrow . The risks and benefits of EGD with possible biopsies were discussed with the patient who agrees to proceed.    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  He was  previously followed by our practice for chronic diarrhea. Extensive workup including EGD and colonoscopy with biopsy in 2023 was unremarkable/she was felt to have IBS but did respond to a course of Xifaxan  and raising question of SIBO.  In May 2025 patient established with [facility] ( Dr. Willma) for evaluation of chronic diarrhea.  He has since undergone repeat EGD and colonoscopy approximately 6 months ago..  Records reviewed in Care Everywhere.  Colonoscopy revealed some irregular mucosa in the terminal ileum.  Terminal ileal biopsies showed focal pseudopyloric metaplasia and architectural disorganization of the crypts.  Random biopsies were negative for pathology.  Upper endoscopy was normal as were biopsies of the duodenum and stomach.  He was given 2 courses of rifaximin  without improvement.  Ultimately he had improvement in diarrhea after starting guar gum.  Patient has chronic nausea .  He had rapid emptying on gastric emptying study and was diagnosed with dumping syndrome .  He has diarrhea has been pretty well-controlled with guar gum.  He manages the chronic nausea with 6 small meals a day, PPI, and digestive enzymes.  He was doing well on this regimen until last Friday when he did not get to eat for several hours for a catheterization.  Getting off track from his regimen developed recurrent nausea and vomiting.  Around 2 AM this morning he  had 2-3 episodes of emesis containing food.  After that he began vomiting bright red blood.  He had 2 or 3 additional episodes of hematemesis and came to ED    Workup notable for:  Hemodynamically stable Hemoglobin 16.6, WBC 9.3 BUN 35, creatinine 1.54 Lipase 71  CTAP with contrast: No acute findings  fluid-filled stomach, small bowel, and colon without obstruction, suggesting acute gastroenteritis/diarrheal illness.   Recent Labs    01/05/25 0310  PROT 8.3*  ALBUMIN 4.5  AST 32  ALT 27  ALKPHOS 112  BILITOT 0.9   Recent Labs    01/05/25 0310   WBC 9.3  HGB 16.6  HCT 50.0  MCV 90.9  PLT 222   Recent Labs    01/05/25 0310  NA 140  K 4.0  CL 103  CO2 21*  GLUCOSE 115*  BUN 35*  CREATININE 1.54*  CALCIUM  10.1   Review of Systems: All systems reviewed and negative except where noted in HPI.  Physical Exam: Vital signs in last 24 hours: Temp:  [98.1 F (36.7 C)-98.5 F (36.9 C)] 98.5 F (36.9 C) (01/23 1107) Pulse Rate:  [80-102] 102 (01/23 1100) Resp:  [7-25] 7 (01/23 1100) BP: (96-133)/(63-85) 104/67 (01/23 1100) SpO2:  [94 %-98 %] 95 % (01/23 1100) Weight:  [83.5 kg] 83.5 kg (01/23 0324)   General:  Pleasant male in NAD Psych:  Cooperative. Normal mood and affect Eyes: Pupils equal Ears:  Normal auditory acuity Nose: No deformity, discharge or lesions Neck:  Supple, no masses felt Lungs:  Clear to auscultation.  Heart:  Regular rate, regular rhythm.  Abdomen:  Soft, nondistended, nontender, active bowel sounds, no masses felt Rectal :  Deferred Msk: Symmetrical without gross deformities.  Neurologic:  Alert, oriented, grossly normal neurologically Extremities : No edema Skin:  Intact without significant lesions.   OUTPATIENT MEDICATIONS Prior to Admission medications  Medication Sig Start Date End Date Taking? Authorizing Provider  acetaminophen  (TYLENOL ) 500 MG tablet Take 500-1,000 mg by mouth every 6 (six) hours as needed for moderate pain (pain score 4-6). 05/22/22  Yes [provider]  aspirin EC 81 MG tablet Take 81 mg by mouth daily.   Yes [provider]  Calcium  Carbonate-Vitamin D (OSCAL 500 D-3 PO) Take 1 tablet by mouth daily.   Yes [provider]  citalopram  (CELEXA ) 40 MG tablet Take 40 mg by mouth daily.   Yes [provider]  diazepam (VALIUM) 5 MG tablet Take 5 mg by mouth 3 (three) times daily. 09/01/24  Yes [provider]  Eszopiclone 3 MG TABS Take 3 mg by mouth at bedtime.   Yes [provider]  magnesium  oxide (MAG-OX) 400 MG  tablet Take 400 mg by mouth daily. 12/25/10  Yes [provider]  Multiple Vitamin (MULTIVITAMIN WITH MINERALS) TABS tablet Take 1 tablet by mouth daily.   Yes [provider]  ondansetron  (ZOFRAN ) 8 MG tablet Take 8 mg by mouth every 8 (eight) hours as needed for nausea or vomiting. 08/09/24  Yes [provider]  pantoprazole (PROTONIX) 40 MG tablet Take 40 mg by mouth 2 (two) times daily.   Yes [provider]  predniSONE  (DELTASONE ) 5 MG tablet Take 5 mg by mouth daily with breakfast.   Yes [provider]  rosuvastatin  (CRESTOR ) 40 MG tablet Take 40 mg by mouth daily.   Yes [provider]  tacrolimus  (PROGRAF ) 1 MG capsule Take 2-3 mg by mouth See admin instructions. Take 2mg  (2 capsules)  ny mouth every morning and 3mg  (3 capsules) at bedtime. 09/01/18  Yes [provider]  topiramate (TOPAMAX) 100 MG tablet Take 100 mg by mouth daily. 11/16/24  Yes [provider]    Allergies as of 01/05/2025 - Review Complete 01/05/2025  Allergen Reaction Noted   Sulfamethoxazole Hives and Rash 01/03/2019   Sulfa antibiotics Hives 09/11/2024   Amoxicillin -pot clavulanate Diarrhea 12/09/2018   Clindamycin Other (See Comments) 12/09/2018   Reglan [metoclopramide]  07/26/2022   Sirolimus Other (See Comments) 06/27/2021   Compazine [prochlorperazine] Anxiety 07/26/2022    INPATIENT MEDICATIONS Current Facility-Administered Medications  Medication Dose Route Frequency Provider Last Rate Last Admin   acetaminophen  (TYLENOL ) tablet 1,000 mg  1,000 mg Oral Q8H PRN Jolaine Pac, DO   1,000 mg at 01/05/25 1210   [START ON 01/06/2025] calcium -vitamin D (OSCAL WITH D) 500-5 MG-MCG per tablet 1 tablet  1 tablet Oral Q breakfast Jolaine Pac, DO       citalopram  (CELEXA ) tablet 40 mg  40 mg Oral Daily Jolaine Pac, DO   40 mg at 01/05/25 1101   diazepam (VALIUM) tablet 5 mg  5 mg Oral Q8H Jolaine Pac, DO   5 mg at 01/05/25 1102    diphenhydrAMINE  (BENADRYL ) injection 25 mg  25 mg Intravenous Once PRN Mesner, Jason, MD       magnesium  oxide (MAG-OX) tablet 400 mg  400 mg Oral Daily Jolaine Pac, DO   400 mg at 01/05/25 1101   multivitamin with minerals tablet 1 tablet  1 tablet Oral Daily Jolaine Pac, DO   1 tablet at 01/05/25 1102   ondansetron  (ZOFRAN -ODT) disintegrating tablet 4 mg  4 mg Oral Q8H PRN Jolaine Pac, DO       pantoprazole (PROTONIX) injection 80 mg  80 mg Intravenous Q12H Jolaine Pac, DO       [START ON 01/06/2025] predniSONE  (DELTASONE ) tablet 5 mg  5 mg Oral Q breakfast Jolaine Pac, DO       promethazine  (PHENERGAN ) 12.5 mg in sodium chloride  0.9 % 50 mL IVPB  12.5 mg Intravenous Q6H PRN Mesner, Selinda, MD 150 mL/hr at 01/05/25 1210 12.5 mg at 01/05/25 1210   rosuvastatin  (CRESTOR ) tablet 40 mg  40 mg Oral Daily Jolaine Pac, DO   40 mg at 01/05/25 1102   tacrolimus  (PROGRAF ) capsule 2 mg  2 mg Oral Daily Jolaine Pac, DO   2 mg at 01/05/25 1101   And   tacrolimus  (PROGRAF ) capsule 3 mg  3 mg Oral QHS Jolaine Pac, DO       tadalafil (CIALIS) tablet 5 mg  5 mg Oral Daily Jolaine Pac, DO   5 mg at 01/05/25 1101   topiramate (TOPAMAX) tablet 100 mg  100 mg Oral Daily Jolaine Pac, DO   100 mg at 01/05/25 1101   zolpidem (AMBIEN) tablet 5 mg  5 mg Oral QHS PRN Jolaine Pac, DO       Current Outpatient Medications  Medication Sig Dispense Refill   acetaminophen  (TYLENOL ) 500 MG tablet Take 500-1,000 mg by mouth every 6 (six) hours as needed for moderate pain (pain score 4-6).     aspirin EC 81 MG tablet Take 81 mg by mouth daily.     Calcium  Carbonate-Vitamin D (OSCAL 500 D-3 PO) Take 1 tablet by mouth daily.     citalopram  (CELEXA ) 40 MG tablet Take 40 mg by mouth daily.     diazepam (VALIUM) 5 MG tablet Take 5 mg by mouth 3 (three) times daily.  Eszopiclone 3 MG TABS Take 3 mg by mouth at bedtime.     magnesium  oxide (MAG-OX) 400 MG tablet Take 400 mg by mouth daily.      Multiple Vitamin (MULTIVITAMIN WITH MINERALS) TABS tablet Take 1 tablet by mouth daily.     ondansetron  (ZOFRAN ) 8 MG tablet Take 8 mg by mouth every 8 (eight) hours as needed for nausea or vomiting.     pantoprazole (PROTONIX) 40 MG tablet Take 40 mg by mouth 2 (two) times daily.     predniSONE  (DELTASONE ) 5 MG tablet Take 5 mg by mouth daily with breakfast.     rosuvastatin  (CRESTOR ) 40 MG tablet Take 40 mg by mouth daily.     tacrolimus  (PROGRAF ) 1 MG capsule Take 2-3 mg by mouth See admin instructions. Take 2mg  (2 capsules) ny mouth every morning and 3mg  (3 capsules) at bedtime.     topiramate (TOPAMAX) 100 MG tablet Take 100 mg by mouth daily.       Past Medical History:  Diagnosis Date   Anxiety    CHF (congestive heart failure) (HCC)    Chronic kidney disease    GERD (gastroesophageal reflux disease)    Heart transplant recipient [facility]) 2008   Hypertension    MRSA infection    L arm   Pneumonia     Past Surgical History:  Procedure Laterality Date   APPENDECTOMY     COLONOSCOPY     heart transplant  2008   heart transplant recipient   HEART TRANSPLANT     INGUINAL HERNIA REPAIR Left 01/03/2024   Procedure: REPAIR LEFT INGUINAL HERNIA WITH MESH;  Surgeon: Vanderbilt Ned, MD;  Location: [facility];  Service: General;  Laterality: Left;  GENERAL & TAP BLOCK    Family History  Problem Relation Age of Onset   Diverticulosis Mother    Diverticulosis Father    Colon cancer Neg Hx    Pancreatic cancer Neg Hx    Esophageal cancer Neg Hx    Stomach cancer Neg Hx    Liver cancer Neg Hx    Rectal cancer Neg Hx     Social History   Socioeconomic History   Marital status: Married    Spouse name: Not on file   Number of children: 3   Years of education: Not on file   Highest education level: Bachelor's degree (e.g., BA, AB, BS)  Occupational History   Not on file  Tobacco Use   Smoking status: Never    Passive exposure: Never   Smokeless tobacco: Never  Vaping Use    Vaping status: Never Used  Substance and Sexual Activity   Alcohol use: Not Currently   Drug use: Not Currently   Sexual activity: Yes  Other Topics Concern   Not on file  Social History Narrative   Lives with his spouse   Social Drivers of Health   Tobacco Use: Low Risk (01/05/2025)   Patient History    Smoking Tobacco Use: Never    Smokeless Tobacco Use: Never    Passive Exposure: Never  Financial Resource Strain: Low Risk  (05/28/2024)   Received from [facility]   Overall Financial Resource Strain (CARDIA)    Difficulty of Paying Living Expenses: Not hard at all  Food Insecurity: No Food Insecurity (05/31/2024)   Epic    Worried About Programme Researcher, Broadcasting/film/video in the Last Year: Never true    Ran Out of Food in the Last Year: Never true  Transportation Needs:  No Transportation Needs (05/31/2024)   Epic    Lack of Transportation (Medical): No    Lack of Transportation (Non-Medical): No  Physical Activity: Sufficiently Active (12/13/2023)   Exercise Vital Sign    Days of Exercise per Week: 3 days    Minutes of Exercise per Session: 60 min  Stress: No Stress Concern Present (12/13/2023)   [employer] of Occupational Health - Occupational Stress Questionnaire    Feeling of Stress : Not at all  Social Connections: Unknown (12/13/2023)   Social Connection and Isolation Panel    Frequency of Communication with Friends and Family: Three times a week    Frequency of Social Gatherings with Friends and Family: Once a week    Attends Religious Services: Patient declined    Active Member of Clubs or Organizations: No    Attends Engineer, Structural: Not on file    Marital Status: Married  Catering Manager Violence: Not At Risk (05/31/2024)   Epic    Fear of Current or Ex-Partner: No    Emotionally Abused: No    Physically Abused: No    Sexually Abused: No  Depression (PHQ2-9): Low Risk (12/13/2023)   Depression (PHQ2-9)    PHQ-2 Score: 0  Alcohol  Screen: Not on file  Housing: Low Risk  (12/28/2024)   Received from [facility]   Epic    In the last 12 months, was there a time when you were not able to pay the mortgage or rent on time?: No    In the past 12 months, how many times have you moved where you were living?: 0    At any time in the past 12 months, were you homeless or living in a shelter (including now)?: No  Utilities: Not At Risk (05/31/2024)   Epic    Threatened with loss of utilities: No  Health Literacy: Not on file    Code Status   Code Status: Full Code   Vina Dasen, NP-C   01/05/2025, 12:47 PM     "

## 2025-01-05 NOTE — Progress Notes (Signed)
" ° ° °  PROCEDURAL EXPEDITER PROGRESS NOTE  Patient Name: Shawn Calderon  DOB:May 23, 1976 Date of Admission: 01/05/2025  Date of Assessment:01/05/25   -------------------------------------------------------------------------------------------------------------------   Brief clinical summary: Pt to Endo tomorrow for EGD  Orders in place: Yes   Labs, test, and orders reviewed: Y  Requires surgical clearance:  No  Barriers noted: N/A  -------------------------------------------------------------------------------------------------------------------  [facility], [place], [place] Please contact us  directly via secure chat (search for [facility]) or by calling us  at [phone] [facility]).  "

## 2025-01-05 NOTE — ED Notes (Signed)
 Pt resting with eyes closed; respirations spontaneous, even, unlabored

## 2025-01-05 NOTE — ED Triage Notes (Addendum)
 Pt BIB [facility] from home c/o of vomiting blood. EMS states this has been going on since 1900 last night. Pt states this has never happened before. Hx of heart transplant 18 years ago. Pt did have cath last Friday 12/29/2024. Pt denies blood in stool. EMS states pt started having back spasm on the way to hospital. Pt denies blood thinners    En route  4 mg zofran   500 mL NS  50 mcg Fentanyl    EMS vitals  Initial BP 90/60 before fluids  Second BP 106/68 after fluids  97.2 oral temp   18 G RAC

## 2025-01-05 NOTE — H&P (View-Only) (Signed)
 "                                               Consultation Note   Referring Provider:   Internal Medicine Teaching Service PCP: Paseda, Folashade R, FNP Primary Gastroenterologist:    Previously Norleen Kiang, MD, now [facility]      Reason for Consultation: Hematemesis DOA: 01/05/2025         Hospital Day: 1   Assessment::   # 49 year old male who is status post heart transplant at [facility] with chronic nausea and chronic diarrhea. Has had extensive workup ([facility] and [facility]). Diarrhea felt to be secondary to dumping syndrome. Etiology of chronic nausea unclear. Generally manages  symptoms well with digestive enzymes, PPI, small frequent meals, and agar gum.  Currently admitted with acute vomiting with hematemesis  Nausea /vomiting could be exacerbation of chronic nausea but also consider gastroenteritis with fluid throughout stomach / intestines on CT scan. Timing of the bloody emesis suspicious for Mallory-Weiss tear  Hemoglobin is normal at 16 though suspect hemoconcentrated from vomiting.  Baseline hemoglobin around 13.5.  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 after midnight --EGD tomorrow . The risks and benefits of EGD with possible biopsies were discussed with the patient who agrees to proceed.    Principal Problem:   Hematemesis Active Problems:   Heart transplant status (HCC)   OCD (obsessive compulsive disorder)   Intractable vomiting   Chronic diarrhea   GERD (gastroesophageal reflux disease)   CKD (chronic kidney disease), stage II   HPI:   Patient is a 49 year old male who is status post heart transplantation at [facility] after complications from a copperhead snake bite.  He was  previously followed by our practice for chronic diarrhea. Extensive workup including EGD and colonoscopy with biopsy in 2023 was unremarkable/she was felt to have IBS but did respond to a course of Xifaxan  and raising question of SIBO.  In May 2025 patient established with [facility] ( Dr. Willma) for evaluation of chronic diarrhea.  He has since undergone repeat EGD and colonoscopy approximately 6 months ago..  Records reviewed in Care Everywhere.  Colonoscopy revealed some irregular mucosa in the terminal ileum.  Terminal ileal biopsies showed focal pseudopyloric metaplasia and architectural disorganization of the crypts.  Random biopsies were negative for pathology.  Upper endoscopy was normal as were biopsies of the duodenum and stomach.  He was given 2 courses of rifaximin  without improvement.  Ultimately he had improvement in diarrhea after starting guar gum.  Patient has chronic nausea .  He had rapid emptying on gastric emptying study and was diagnosed with dumping syndrome .  He has diarrhea has been pretty well-controlled with guar gum.  He manages the chronic nausea with 6 small meals a day, PPI, and digestive enzymes.  He was doing well on this regimen until last Friday when he did not get to eat for several hours for a catheterization.  Getting off track from his regimen developed recurrent nausea and vomiting.  Around 2 AM this morning he  had 2-3 episodes of emesis containing food.  After that he began vomiting bright red blood.  He had 2 or 3 additional episodes of hematemesis and came to ED    Workup notable for:  Hemodynamically stable Hemoglobin 16.6, WBC 9.3 BUN 35, creatinine 1.54 Lipase 71  CTAP with contrast: No acute findings  fluid-filled stomach, small bowel, and colon without obstruction, suggesting acute gastroenteritis/diarrheal illness.   Recent Labs    01/05/25 0310  PROT 8.3*  ALBUMIN 4.5  AST 32  ALT 27  ALKPHOS 112  BILITOT 0.9   Recent Labs    01/05/25 0310   WBC 9.3  HGB 16.6  HCT 50.0  MCV 90.9  PLT 222   Recent Labs    01/05/25 0310  NA 140  K 4.0  CL 103  CO2 21*  GLUCOSE 115*  BUN 35*  CREATININE 1.54*  CALCIUM  10.1   Review of Systems: All systems reviewed and negative except where noted in HPI.  Physical Exam: Vital signs in last 24 hours: Temp:  [98.1 F (36.7 C)-98.5 F (36.9 C)] 98.5 F (36.9 C) (01/23 1107) Pulse Rate:  [80-102] 102 (01/23 1100) Resp:  [7-25] 7 (01/23 1100) BP: (96-133)/(63-85) 104/67 (01/23 1100) SpO2:  [94 %-98 %] 95 % (01/23 1100) Weight:  [83.5 kg] 83.5 kg (01/23 0324)   General:  Pleasant male in NAD Psych:  Cooperative. Normal mood and affect Eyes: Pupils equal Ears:  Normal auditory acuity Nose: No deformity, discharge or lesions Neck:  Supple, no masses felt Lungs:  Clear to auscultation.  Heart:  Regular rate, regular rhythm.  Abdomen:  Soft, nondistended, nontender, active bowel sounds, no masses felt Rectal :  Deferred Msk: Symmetrical without gross deformities.  Neurologic:  Alert, oriented, grossly normal neurologically Extremities : No edema Skin:  Intact without significant lesions.   OUTPATIENT MEDICATIONS Prior to Admission medications  Medication Sig Start Date End Date Taking? Authorizing Provider  acetaminophen  (TYLENOL ) 500 MG tablet Take 500-1,000 mg by mouth every 6 (six) hours as needed for moderate pain (pain score 4-6). 05/22/22  Yes [provider]  aspirin EC 81 MG tablet Take 81 mg by mouth daily.   Yes [provider]  Calcium  Carbonate-Vitamin D (OSCAL 500 D-3 PO) Take 1 tablet by mouth daily.   Yes [provider]  citalopram  (CELEXA ) 40 MG tablet Take 40 mg by mouth daily.   Yes [provider]  diazepam  (VALIUM ) 5 MG tablet Take 5 mg by mouth 3 (three) times daily. 09/01/24  Yes [provider]  Eszopiclone 3 MG TABS Take 3 mg by mouth at bedtime.   Yes [provider]  magnesium  oxide (MAG-OX) 400 MG  tablet Take 400 mg by mouth daily. 12/25/10  Yes [provider]  Multiple Vitamin (MULTIVITAMIN WITH MINERALS) TABS tablet Take 1 tablet by mouth daily.   Yes [provider]  ondansetron  (ZOFRAN ) 8 MG tablet Take 8 mg by mouth every 8 (eight) hours as needed for nausea or vomiting. 08/09/24  Yes [provider]  pantoprazole  (PROTONIX ) 40 MG tablet Take 40 mg by mouth 2 (two) times daily.   Yes [provider]  predniSONE  (DELTASONE ) 5 MG tablet Take 5 mg by mouth daily with breakfast.   Yes [provider]  rosuvastatin  (CRESTOR ) 40 MG tablet Take 40 mg by mouth daily.   Yes [provider]  tacrolimus  (PROGRAF ) 1 MG capsule Take 2-3 mg by mouth See admin instructions. Take 2mg  (2 capsules)  ny mouth every morning and 3mg  (3 capsules) at bedtime. 09/01/18  Yes [provider]  topiramate  (TOPAMAX ) 100 MG tablet Take 100 mg by mouth daily. 11/16/24  Yes [provider]    Allergies as of 01/05/2025 - Review Complete 01/05/2025  Allergen Reaction Noted   Sulfamethoxazole Hives and Rash 01/03/2019   Sulfa antibiotics Hives 09/11/2024   Amoxicillin -pot clavulanate Diarrhea 12/09/2018   Clindamycin Other (See Comments) 12/09/2018   Reglan [metoclopramide]  07/26/2022   Sirolimus Other (See Comments) 06/27/2021   Compazine [prochlorperazine] Anxiety 07/26/2022    INPATIENT MEDICATIONS Current Facility-Administered Medications  Medication Dose Route Frequency Provider Last Rate Last Admin   acetaminophen  (TYLENOL ) tablet 1,000 mg  1,000 mg Oral Q8H PRN Jolaine Pac, DO   1,000 mg at 01/05/25 1210   [START ON 01/06/2025] calcium -vitamin D (OSCAL WITH D) 500-5 MG-MCG per tablet 1 tablet  1 tablet Oral Q breakfast Jolaine Pac, DO       citalopram  (CELEXA ) tablet 40 mg  40 mg Oral Daily Jolaine Pac, DO   40 mg at 01/05/25 1101   diazepam  (VALIUM ) tablet 5 mg  5 mg Oral Q8H Jolaine Pac, DO   5 mg at 01/05/25 1102    diphenhydrAMINE  (BENADRYL ) injection 25 mg  25 mg Intravenous Once PRN Mesner, Jason, MD       magnesium  oxide (MAG-OX) tablet 400 mg  400 mg Oral Daily Jolaine Pac, DO   400 mg at 01/05/25 1101   multivitamin with minerals tablet 1 tablet  1 tablet Oral Daily Jolaine Pac, DO   1 tablet at 01/05/25 1102   ondansetron  (ZOFRAN -ODT) disintegrating tablet 4 mg  4 mg Oral Q8H PRN Jolaine Pac, DO       pantoprazole  (PROTONIX ) injection 80 mg  80 mg Intravenous Q12H Jolaine Pac, DO       [START ON 01/06/2025] predniSONE  (DELTASONE ) tablet 5 mg  5 mg Oral Q breakfast Jolaine Pac, DO       promethazine  (PHENERGAN ) 12.5 mg in sodium chloride  0.9 % 50 mL IVPB  12.5 mg Intravenous Q6H PRN Mesner, Selinda, MD 150 mL/hr at 01/05/25 1210 12.5 mg at 01/05/25 1210   rosuvastatin  (CRESTOR ) tablet 40 mg  40 mg Oral Daily Jolaine Pac, DO   40 mg at 01/05/25 1102   tacrolimus  (PROGRAF ) capsule 2 mg  2 mg Oral Daily Jolaine Pac, DO   2 mg at 01/05/25 1101   And   tacrolimus  (PROGRAF ) capsule 3 mg  3 mg Oral QHS Jolaine Pac, DO       tadalafil  (CIALIS ) tablet 5 mg  5 mg Oral Daily Jolaine Pac, DO   5 mg at 01/05/25 1101   topiramate  (TOPAMAX ) tablet 100 mg  100 mg Oral Daily Jolaine Pac, DO   100 mg at 01/05/25 1101   zolpidem  (AMBIEN ) tablet 5 mg  5 mg Oral QHS PRN Jolaine Pac, DO       Current Outpatient Medications  Medication Sig Dispense Refill   acetaminophen  (TYLENOL ) 500 MG tablet Take 500-1,000 mg by mouth every 6 (six) hours as needed for moderate pain (pain score 4-6).     aspirin EC 81 MG tablet Take 81 mg by mouth daily.     Calcium  Carbonate-Vitamin D (OSCAL 500 D-3 PO) Take 1 tablet by mouth daily.     citalopram  (CELEXA ) 40 MG tablet Take 40 mg by mouth daily.     diazepam  (VALIUM ) 5 MG tablet Take 5 mg by mouth 3 (three) times daily.  Eszopiclone 3 MG TABS Take 3 mg by mouth at bedtime.     magnesium  oxide (MAG-OX) 400 MG tablet Take 400 mg by mouth daily.      Multiple Vitamin (MULTIVITAMIN WITH MINERALS) TABS tablet Take 1 tablet by mouth daily.     ondansetron  (ZOFRAN ) 8 MG tablet Take 8 mg by mouth every 8 (eight) hours as needed for nausea or vomiting.     pantoprazole  (PROTONIX ) 40 MG tablet Take 40 mg by mouth 2 (two) times daily.     predniSONE  (DELTASONE ) 5 MG tablet Take 5 mg by mouth daily with breakfast.     rosuvastatin  (CRESTOR ) 40 MG tablet Take 40 mg by mouth daily.     tacrolimus  (PROGRAF ) 1 MG capsule Take 2-3 mg by mouth See admin instructions. Take 2mg  (2 capsules) ny mouth every morning and 3mg  (3 capsules) at bedtime.     topiramate  (TOPAMAX ) 100 MG tablet Take 100 mg by mouth daily.       Past Medical History:  Diagnosis Date   Anxiety    CHF (congestive heart failure) (HCC)    Chronic kidney disease    GERD (gastroesophageal reflux disease)    Heart transplant recipient [facility]) 2008   Hypertension    MRSA infection    L arm   Pneumonia     Past Surgical History:  Procedure Laterality Date   APPENDECTOMY     COLONOSCOPY     heart transplant  2008   heart transplant recipient   HEART TRANSPLANT     INGUINAL HERNIA REPAIR Left 01/03/2024   Procedure: REPAIR LEFT INGUINAL HERNIA WITH MESH;  Surgeon: Vanderbilt Ned, MD;  Location: [facility];  Service: General;  Laterality: Left;  GENERAL & TAP BLOCK    Family History  Problem Relation Age of Onset   Diverticulosis Mother    Diverticulosis Father    Colon cancer Neg Hx    Pancreatic cancer Neg Hx    Esophageal cancer Neg Hx    Stomach cancer Neg Hx    Liver cancer Neg Hx    Rectal cancer Neg Hx     Social History   Socioeconomic History   Marital status: Married    Spouse name: Not on file   Number of children: 3   Years of education: Not on file   Highest education level: Bachelor's degree (e.g., BA, AB, BS)  Occupational History   Not on file  Tobacco Use   Smoking status: Never    Passive exposure: Never   Smokeless tobacco: Never  Vaping Use    Vaping status: Never Used  Substance and Sexual Activity   Alcohol use: Not Currently   Drug use: Not Currently   Sexual activity: Yes  Other Topics Concern   Not on file  Social History Narrative   Lives with his spouse   Social Drivers of Health   Tobacco Use: Low Risk (01/05/2025)   Patient History    Smoking Tobacco Use: Never    Smokeless Tobacco Use: Never    Passive Exposure: Never  Financial Resource Strain: Low Risk  (05/28/2024)   Received from [facility]   Overall Financial Resource Strain (CARDIA)    Difficulty of Paying Living Expenses: Not hard at all  Food Insecurity: No Food Insecurity (05/31/2024)   Epic    Worried About Programme Researcher, Broadcasting/film/video in the Last Year: Never true    Ran Out of Food in the Last Year: Never true  Transportation Needs:  No Transportation Needs (05/31/2024)   Epic    Lack of Transportation (Medical): No    Lack of Transportation (Non-Medical): No  Physical Activity: Sufficiently Active (12/13/2023)   Exercise Vital Sign    Days of Exercise per Week: 3 days    Minutes of Exercise per Session: 60 min  Stress: No Stress Concern Present (12/13/2023)   [employer] of Occupational Health - Occupational Stress Questionnaire    Feeling of Stress : Not at all  Social Connections: Unknown (12/13/2023)   Social Connection and Isolation Panel    Frequency of Communication with Friends and Family: Three times a week    Frequency of Social Gatherings with Friends and Family: Once a week    Attends Religious Services: Patient declined    Active Member of Clubs or Organizations: No    Attends Engineer, Structural: Not on file    Marital Status: Married  Catering Manager Violence: Not At Risk (05/31/2024)   Epic    Fear of Current or Ex-Partner: No    Emotionally Abused: No    Physically Abused: No    Sexually Abused: No  Depression (PHQ2-9): Low Risk (12/13/2023)   Depression (PHQ2-9)    PHQ-2 Score: 0  Alcohol  Screen: Not on file  Housing: Low Risk  (12/28/2024)   Received from [facility]   Epic    In the last 12 months, was there a time when you were not able to pay the mortgage or rent on time?: No    In the past 12 months, how many times have you moved where you were living?: 0    At any time in the past 12 months, were you homeless or living in a shelter (including now)?: No  Utilities: Not At Risk (05/31/2024)   Epic    Threatened with loss of utilities: No  Health Literacy: Not on file    Code Status   Code Status: Full Code   Vina Dasen, NP-C   01/05/2025, 12:47 PM     "

## 2025-01-05 NOTE — Hospital Course (Addendum)
 Hematemesis Suspected upper GI bleed Recurrent nausea and vomiting Patient presented after 10-12 episodes of hematemesis.  Patient said that he developed nausea and vomiting, then vomited about 2 or 3 times before he started noticing bright red blood in his vomit.  Patient presented to the ED hemodynamically stable without tachycardia or hypotension, initial lab work showing stable hemoglobin of 16.6, platelets 222, creatinine 1.54, BUN, 35 and lipase of 71.  CT abdomen pelvis showed fluid-filled stomach, small bowel and colon without obstruction consistent with acute gastroenteritis/diarrhea.  Gastroenterology was consulted in the emergency department and recommended scheduling EGD.  Patient received 1 L lactated Ringer 's bolus, Phenergan  and changed his oral PPI to IV pantoprazole .  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 ED physician spoke to Dr. Quintin with [facility] cardiology who did not recommend any changes to his immunosuppressive meds.  Recently underwent echocardiogram and heart cath with preserved LVEF and stable allograft vasculopathy with negative ischemic eval and normal filling pressures.  He resumed his home prednisone  5 mg daily, tacro stainings in the morning and 20 mg at night, as well as his pantoprazole .  We monitor patient on continuous telemetry during this hospitalization.  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  Creatinine elevated to 1.54 on admission which could be indicative of an AKI with his severe vomiting.  He has  received IV fluids, blood pressure is stable, and we will monitor his urine output. - Repeat metabolic panel in the morning - Monitor urine output  BPH Patient continues to do well with his lower urinary tract symptoms managed with tadalafil  at home.  We we will plan to continue this medicine and we will check orthostatic vital signs before leaving the hospital in the setting of volume loss. - Orthostatic vital signs

## 2025-01-05 NOTE — H&P (Addendum)
 " Date: 01/05/2025               Patient Name:  Shawn Calderon MRN: [id number]  DOB: Apr 22, 1976 Age / Sex: 49 y.o., male   PCP: Juanice Thomes SAUNDERS, FNP         Medical Service: Internal Medicine Teaching Service         Attending Physician: Dr. Trudy, Mliss Dragon, MD      First Contact: Dr. Alfornia Light, DO    Second Contact: Dr. 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  He has a long history of GI problems with chronic diarrhea and eventually diagnosed as dumping syndrome and continues to have recurrent episodes of nausea that can last up to 2 weeks.  Usually has nausea episodes do not have severe vomiting.  Yesterday he developed nausea and vomited 2 or 3 times before vomiting bright red blood.  He denies any chest pain, abdominal pain, melena, hematochezia, severe fatigue, or orthostatic symptoms although he has been in the hospital most the time since this started.  He denies any previous episodes of hematemesis.  He takes prednisone  daily and open to recently was taking pantoprazole  40 mg twice daily but then began taking it daily a few weeks ago.  He does not take NSAIDs aside from daily 81 mg aspirin and naproxen cough and cold 1-2 times a month.  His nausea is doing much better after receiving Phenergan .  ED Course: Presented as above hemodynamically stable without tachycardia or hypotension.  Initial lab work showed hemoglobin of 16.6, platelets of 222, creatinine of 1.54, BUN of 35, and lipase of 71.  CT  of the abdomen pelvis with contrast showed fluid-filled stomach, small bowel, and colon without obstruction consistent with acute gastroenteritis/diarrheal illness.  Case was discussed with [facility] cardiology due to his transplant history who agreed with admission without transfer.  Gastroenterology was consulted and [facility]MT[facility] was paged for admission.  Past Medical History Dilated cardiomyopathy s/p heart transplant in 2008 complicated by chronic allograft vasculopathy Chronic immunosuppression CKD stage II/IIIa Hyperlipidemia Chronic diarrhea with dumping syndrome OCD GERD  Meds:  Aspirin 81 mg daily Calcium  carbonate/vitamin D3 twice daily Citalopram  40 mg daily Diazepam  2 mg 3 times daily as needed, takes 3 times daily Lunesta 3 mg nightly as needed, takes every night Magnesium  oxide 400 mg daily Multivitamin daily Prednisone  5 mg daily Pantoprazole  40 mg daily, previously twice daily Rosuvastatin  40 mg daily Tacrolimus  2 mg in the morning and 3 mg in the evening Tadalafil  5 mg daily Topiramate  100 mg daily  Past Surgical History Past Surgical History:  Procedure Laterality Date   APPENDECTOMY     COLONOSCOPY     heart transplant  2008   heart transplant recipient   HEART TRANSPLANT     INGUINAL HERNIA REPAIR Left 01/03/2024   Procedure: REPAIR LEFT INGUINAL HERNIA WITH MESH;  Surgeon: Vanderbilt Ned, MD;  Location: [facility];  Service: General;  Laterality: Left;  GENERAL & TAP BLOCK    Social:  Lives  with wife, daughter, and dog.  Independent in ADLs and IADLs. PCP: Paseda, Folashade R, FNP Substances: Prior alcohol use, abstinent for several years.  Denies tobacco or drug use.  Family History:  Family History  Problem Relation Age of Onset   Diverticulosis Mother    Diverticulosis Father    Colon cancer Neg Hx    Pancreatic cancer Neg Hx    Esophageal cancer Neg Hx    Stomach cancer Neg Hx    Liver cancer Neg Hx    Rectal cancer Neg Hx     Allergies: Allergies as  of 01/05/2025 - Review Complete 01/05/2025  Allergen Reaction Noted   Sulfamethoxazole Hives and Rash 01/03/2019   Sulfa antibiotics Hives 09/11/2024   Amoxicillin -pot clavulanate Diarrhea 12/09/2018   Clindamycin Other (See Comments) 12/09/2018   Reglan [metoclopramide]  07/26/2022   Sirolimus Other (See Comments) 06/27/2021   Compazine [prochlorperazine] Anxiety 07/26/2022    Review of Systems: A complete ROS was negative except as per HPI.   OBJECTIVE:   Physical Exam: Blood pressure 100/70, pulse 95, temperature 98.1 F (36.7 C), temperature source Oral, resp. rate 12, height 5' 10 (1.778 m), weight 83.5 kg, SpO2 97%.  Constitutional: Slightly uncomfortable appearing middle-age male. In no acute distress. HENT: Normocephalic, atraumatic, oropharynx clear Eyes: Sclera non-icteric, PERRL, EOM intact Cardio:Regular rate and rhythm. 2+ bilateral radial and dorsalis pedis  pulses.  Extremities warm well-perfused Pulm:Clear to auscultation bilaterally. Normal work of breathing on room air. Abdomen: Soft, non-tender, non-distended, positive bowel sounds. FDX:Wzhjupcz for extremity edema. Skin:Warm and dry. Neuro:Alert and oriented x3. No focal deficit noted. Psych:Pleasant mood and affect.  Labs: CBC    Component Value Date/Time   WBC 9.3 01/05/2025 0310   RBC 5.50 01/05/2025 0310   HGB 16.6 01/05/2025 0310   HGB 13.8 09/04/2020 1458   HCT 50.0 01/05/2025 0310   HCT 40.6 09/04/2020 1458   PLT 222 01/05/2025 0310   PLT 246 09/04/2020 1458   MCV 90.9 01/05/2025 0310   MCV 85 09/04/2020 1458   MCH 30.2 01/05/2025 0310   MCHC 33.2 01/05/2025 0310   RDW 12.8 01/05/2025 0310   RDW 12.2 09/04/2020 1458   LYMPHSABS 1.7 01/05/2025 0310   LYMPHSABS 2.4 09/04/2020 1458   MONOABS 0.5 01/05/2025 0310   EOSABS 0.0 01/05/2025 0310   EOSABS 0.1 09/04/2020 1458   BASOSABS 0.0 01/05/2025 0310   BASOSABS 0.0 09/04/2020 1458     CMP     Component Value Date/Time   NA 140  01/05/2025 0310   K 4.0 01/05/2025 0310   CL 103 01/05/2025 0310   CO2 21 (L) 01/05/2025 0310   GLUCOSE 115 (H) 01/05/2025 0310   BUN 35 (H) 01/05/2025 0310   CREATININE 1.54 (H) 01/05/2025 0310   CALCIUM  10.1 01/05/2025 0310   PROT 8.3 (H) 01/05/2025 0310   ALBUMIN 4.5 01/05/2025 0310   AST 32 01/05/2025 0310   ALT 27 01/05/2025 0310   ALKPHOS 112 01/05/2025 0310   BILITOT 0.9 01/05/2025 0310   GFRNONAA 55 (L) 01/05/2025 0310   GFRAA >60 04/24/2020 0431    Imaging: CT ABDOMEN PELVIS W CONTRAST Result Date: 01/05/2025 EXAM: CT ABDOMEN AND PELVIS WITH CONTRAST 01/05/2025 05:22:30 AM TECHNIQUE: CT of the abdomen and pelvis was performed with the administration of 75 mL of iohexol  (OMNIPAQUE ) 350 MG/ML injection. Multiplanar reformatted images are provided for review. Automated exposure control, iterative reconstruction, and/or weight-based adjustment of the mA/kV was utilized to reduce the radiation dose to as low  as reasonably achievable. COMPARISON: Non-contrast CT abdomen and pelvis 09/12/2024 and CT abdomen and pelvis with contrast 05/01/2020. CLINICAL HISTORY: 49 year old male with vomiting blood since 1900 hours, remote history of heart transplant, and acute, nonlocalized abdominal pain. FINDINGS: LOWER CHEST: Heart size stable since 2021 and no pericardial effusion. Partially visible sternotomy. Mildly lower lung volumes, mild lung base atelectasis. No pleural effusion. LIVER: Small but circumscribed low density areas in the liver such as series 3 image 14 are chronic and most likely benign cysts or hemangiomas (no follow up imaging recommended). GALLBLADDER AND BILE DUCTS: Gallbladder is unremarkable. No biliary ductal dilatation. SPLEEN: No acute abnormality. PANCREAS: Partial pancreatic atrophy. ADRENAL GLANDS: No acute abnormality. KIDNEYS, URETERS AND BLADDER: Benign exophytic right renal cyst (no follow up imaging recommended). Mild degree of renal atrophy is suspected. Symmetric renal  enhancement and early contrast excretion. No stones in the kidneys or ureters. No hydronephrosis. No perinephric or periureteral stranding. Urinary bladder is unremarkable. GI AND BOWEL: Small chronic gastrocaital hernia also stable. Fluid in the large bowel to the level of the rectum. Fluid throughout the small bowel. Retained fluid also in the stomach. No significantly dilated loops. Appendix not identified, diminutive or absent. Diverticulosis of the descending and the sigmoid colon. No discrete bowel wall thickening or mesenteric inflammation. PERITONEUM AND RETROPERITONEUM: No ascites. No free air. VASCULATURE: Major arterial structures and portal venous system appear patent and enhancing with no significant atherosclerosis identified. Aorta is normal in caliber. LYMPH NODES: No lymphadenopathy. REPRODUCTIVE ORGANS: Right testis now marginally located in the distal inguinal canal (series 3 image 95), probably retractile testicle. Superimposed small fat containing right inguinal hernia is stable with no complicating features. 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 No acute osseous abnormality. No focal soft tissue abnormality. IMPRESSION: 1. Fluid-filled stomach, small bowel, and colon without obstruction, suggesting acute gastroenteritis/diarrheal illness. 2. No other acute abnormality in the abdomen or pelvis. Electronically signed by: Helayne Hurst MD 01/05/2025 06:02 AM EST RP Workstation: [id number]     ASSESSMENT & PLAN:   Assessment & Plan by Problem: Principal Problem:   Hematemesis Active Problems:   Heart transplant status (HCC)   OCD (obsessive compulsive disorder)   Intractable vomiting   Chronic diarrhea   GERD (gastroesophageal reflux disease)   CKD (chronic kidney disease), stage II   Shawn Calderon is a 49 y.o. 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  Hematemesis Suspected upper GI bleed Recurrent nausea and vomiting Remains hemodynamically stable without return of hematemesis.  Nausea is well-controlled with Phenergan  and prior to getting Phenergan  he had 2-3 episodes of vomiting without blood in the ED.  No hemoglobin drop on initial labs and vitals remained stable without tachycardia. Last EGD/colonoscopy from 05/2024 showed erythematous mucosa in the antrum, normal duodenum, and colonoscopy showed diverticulosis, nonbleeding internal hemorrhoids, and erythematous mucosa in the terminal ileum, but otherwise normal.  Biopsy results showed gastric antral mucosa with reactive foveolar hyperplasia and mild chronic gastritis, negative for H. pylori.  Terminal ileum showed focal pseudopyloric metaplasia and Paneth cell disorganization with rare neutrophil seen in a gland without acute ileitis or granulomas.  Random colon biopsies showed mild architectural disarray but no colitis, granulomas, or dysplasia. Discussed with gastroenterology who will plan for EGD  today versus tomorrow, likely tomorrow.  We will monitor him on telemetry and check a CBC this afternoon.  If CBC stable tomorrow and no significantly abnormal findings on EGD we will hopefully discharge tomorrow. - IV pantoprazole  80 mg daily twice daily, plan to discharge on pantoprazole  40 mg twice daily - Phenergan  12.5 mg every 6 hours as needed and Zofran  4 mg every 8 hours as needed - Clear liquid diet, may give additional 1 L LR bolus if  vomiting resumes - Repeat CBC - N.p.o. at midnight for EGD  Dilated cardiomyopathy s/p heart transplant complicated by chronic allograft vasculopathy Due to some GI upset some of his immunosuppressants been changed over the last several months.  He is very aware of his medicines and  we will continue his current meds.  ED physician spoke to Dr. Quintin with [facility] cardiology who did not recommend any changes to his immunosuppressive meds.  Recently underwent echocardiogram and heart cath with preserved LVEF and stable allograft vasculopathy with negative ischemic eval and normal filling pressures. - EKG - Continue prednisone  5 mg daily, tacrolimus  2 mg in the morning and 3 mg in the evening, and pantoprazole  as above - Hold aspirin today and if CBC stable and EGD without significant abnormalities resume tomorrow  CKD stage II vs IIIb Most recent baseline creatinine shows a GFR above 60 but a lot of her recent values showed GFR in the 50s.  Creatinine elevated to 1.54 here with recent baseline 1.3.  Could be indicative of an AKI with his severe vomiting.  He has received IV fluids, blood pressure is stable, and we will monitor his urine output. - Repeat metabolic panel in the morning - Monitor urine output  Chronic diarrhea At home he uses guar gum with good benefit and when he gets some bloating uses Gas-X with some benefit.  While on a clear liquid diet and preparing for EGD we will hold off on thickening agents and watch for any GI bleeding as above.  Plan to resume guar gum at home.  BPH Does well with his lower urinary tract symptoms on 5 mg of tadalafil  at home.  We we will plan to continue this medicine and we will check orthostatic vital signs before leaving the hospital in the setting of volume loss. - Orthostatic vital signs  Diet: Clear liquid VTE: SCDs Code: Full  Dispo: Admit patient to Observation with expected length of stay less than 2 midnights.  Signed: Fairy Pool, DO Internal Medicine Resident, PGY-3 Please contact the on call pager at [phone] for any urgent or emergent needs. 9:50 AM 01/05/2025 "

## 2025-01-05 NOTE — ED Notes (Signed)
 Assumed care for this pt. Pt is resting in bed at this time. Pt denies N/V, epigastric, and chest pain.

## 2025-01-05 NOTE — ED Notes (Signed)
 EKG was captured

## 2025-01-05 NOTE — ED Notes (Signed)
 Patient transported to CT

## 2025-01-06 ENCOUNTER — Encounter (HOSPITAL_COMMUNITY): Admission: EM | Disposition: A | Payer: Self-pay | Source: Home / Self Care | Attending: Emergency Medicine

## 2025-01-06 ENCOUNTER — Observation Stay (HOSPITAL_COMMUNITY): Payer: Self-pay

## 2025-01-06 ENCOUNTER — Encounter (HOSPITAL_COMMUNITY): Payer: Self-pay | Admitting: Internal Medicine

## 2025-01-06 DIAGNOSIS — I509 Heart failure, unspecified: Secondary | ICD-10-CM

## 2025-01-06 DIAGNOSIS — K222 Esophageal obstruction: Secondary | ICD-10-CM

## 2025-01-06 DIAGNOSIS — I130 Hypertensive heart and chronic kidney disease with heart failure and stage 1 through stage 4 chronic kidney disease, or unspecified chronic kidney disease: Secondary | ICD-10-CM

## 2025-01-06 DIAGNOSIS — N182 Chronic kidney disease, stage 2 (mild): Secondary | ICD-10-CM

## 2025-01-06 DIAGNOSIS — N179 Acute kidney failure, unspecified: Secondary | ICD-10-CM

## 2025-01-06 LAB — COMPREHENSIVE METABOLIC PANEL WITH GFR
ALT: 17 U/L (ref 0–44)
AST: 19 U/L (ref 15–41)
Albumin: 3.7 g/dL (ref 3.5–5.0)
Alkaline Phosphatase: 74 U/L (ref 38–126)
Anion gap: 9 (ref 5–15)
BUN: 42 mg/dL — ABNORMAL HIGH (ref 6–20)
CO2: 25 mmol/L (ref 22–32)
Calcium: 9.1 mg/dL (ref 8.9–10.3)
Chloride: 105 mmol/L (ref 98–111)
Creatinine, Ser: 2 mg/dL — ABNORMAL HIGH (ref 0.61–1.24)
GFR, Estimated: 40 mL/min — ABNORMAL LOW
Glucose, Bld: 92 mg/dL (ref 70–99)
Potassium: 4.1 mmol/L (ref 3.5–5.1)
Sodium: 139 mmol/L (ref 135–145)
Total Bilirubin: 0.9 mg/dL (ref 0.0–1.2)
Total Protein: 6.7 g/dL (ref 6.5–8.1)

## 2025-01-06 LAB — HIV ANTIBODY (ROUTINE TESTING W REFLEX): HIV Screen 4th Generation wRfx: NONREACTIVE

## 2025-01-06 LAB — CBC
HCT: 41.8 % (ref 39.0–52.0)
Hemoglobin: 14.1 g/dL (ref 13.0–17.0)
MCH: 30.7 pg (ref 26.0–34.0)
MCHC: 33.7 g/dL (ref 30.0–36.0)
MCV: 91.1 fL (ref 80.0–100.0)
Platelets: 176 10*3/uL (ref 150–400)
RBC: 4.59 MIL/uL (ref 4.22–5.81)
RDW: 13.2 % (ref 11.5–15.5)
WBC: 10 10*3/uL (ref 4.0–10.5)
nRBC: 0 % (ref 0.0–0.2)

## 2025-01-06 MED ORDER — PROPOFOL 500 MG/50ML IV EMUL
INTRAVENOUS | Status: DC | PRN
  Administered 2025-01-06: 150 ug/kg/min via INTRAVENOUS

## 2025-01-06 MED ORDER — SODIUM CHLORIDE 0.9 % IV SOLN: INTRAVENOUS | Status: DC

## 2025-01-06 MED ORDER — PROPOFOL 10 MG/ML IV BOLUS
INTRAVENOUS | Status: DC | PRN
  Administered 2025-01-06: 20 mg via INTRAVENOUS
  Administered 2025-01-06: 10 mg via INTRAVENOUS
  Administered 2025-01-06: 20 mg via INTRAVENOUS
  Administered 2025-01-06: 50 mg via INTRAVENOUS

## 2025-01-06 MED ORDER — DEXAMETHASONE SOD PHOSPHATE PF 10 MG/ML IJ SOLN
INTRAMUSCULAR | Status: DC | PRN
  Administered 2025-01-06: 10 mg via INTRAVENOUS

## 2025-01-06 MED ORDER — ONDANSETRON HCL 4 MG/2ML IJ SOLN
INTRAMUSCULAR | Status: DC | PRN
  Administered 2025-01-06: 4 mg via INTRAVENOUS

## 2025-01-06 MED ORDER — MELATONIN 3 MG PO TABS
3.0000 mg | ORAL_TABLET | Freq: Every day | ORAL | Status: DC
  Administered 2025-01-06: 3 mg via ORAL
  Filled 2025-01-06: qty 1

## 2025-01-06 MED ORDER — LIDOCAINE 2% (20 MG/ML) 5 ML SYRINGE
INTRAMUSCULAR | Status: DC | PRN
  Administered 2025-01-06: 60 mg via INTRAVENOUS

## 2025-01-06 MED ORDER — SODIUM CHLORIDE 0.9 % IV SOLN: INTRAVENOUS | Status: DC | PRN

## 2025-01-06 NOTE — Interval H&P Note (Signed)
 History and Physical Interval Note:  01/06/2025 4:57 PM  Shawn Calderon  has presented today for surgery, with the diagnosis of Hematemesis.  The various methods of treatment have been discussed with the patient and family. After consideration of risks, benefits and other options for treatment, the patient has consented to  Procedures: EGD (ESOPHAGOGASTRODUODENOSCOPY) (N/A) as a surgical intervention.  The patient's history has been reviewed, patient examined, no change in status, stable for surgery.  I have reviewed the patient's chart and labs.  Questions were answered to the patient's satisfaction.     Malachy Coleman D

## 2025-01-06 NOTE — Progress Notes (Signed)
 "  HD#0 SUBJECTIVE:  Patient Summary: Shawn Calderon is a 49 y.o. 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  Overnight Events: none.  Interim History: Patient was seen this morning on rounds resting comfortably in bed.  Patient reports no bouts of hematemesis or emesis since admission, and reports much improved nausea.  Patient does note that he had some bouts of diarrhea, though that this is a chronic issue for him.  Discussed with patient if EGD results are nonconcerning, he can likely go home in the afternoon as long as he has good follow-up.  Patient voiced understanding, and has no other questions or concerns at this time.  OBJECTIVE:  Vital Signs: Vitals:   01/05/25 2010 01/06/25 0458 01/06/25 0500 01/06/25 0915  BP: 97/64 114/60  112/77  Pulse: 71 69    Resp: 16 16    Temp: 97.9 F (36.6 C) 98.6 F (37 C)  98.4 F (36.9 C)  TempSrc:    Oral  SpO2: 97% 94%  95%  Weight:   83.6 kg   Height:       Supplemental O2: Room Air SpO2: 95 %  Filed Weights   01/05/25 0324 01/06/25 0500  Weight: 83.5 kg 83.6 kg     Intake/Output Summary (Last 24 hours) at 01/06/2025 1013 Last data filed at 01/05/2025 1814 Gross per 24 hour  Intake 306 ml  Output 0 ml  Net 306 ml   Net IO Since Admission: 1,206 mL [01/06/25 1013]  Physical Exam: Physical Exam General: Pleasant appearing middle-age male. In no acute distress Cardio: Regular rate and rhythm Pulm: Clear to auscultation bilaterally. Normal work of breathing on room air MSK: Negative for extremity edema Skin: Warm and dry Patient Lines/Drains/Airways Status     Active Line/Drains/Airways     Name Placement date Placement time Site Days   Peripheral IV 01/05/25 18 G Right  Antecubital 01/05/25  0338  Antecubital  1            Pertinent labs and imaging:     Latest Ref Rng & Units 01/06/2025    7:03 AM 01/05/2025    3:10 AM 09/11/2024   11:37 PM  CBC  WBC 4.0 - 10.5 K/uL 10.0  9.3  7.8   Hemoglobin 13.0 - 17.0 g/dL 85.8  83.3  86.7   Hematocrit 39.0 - 52.0 % 41.8  50.0  40.1   Platelets 150 - 400 K/uL 176  222  205        Latest Ref Rng & Units 01/06/2025    7:03 AM 01/05/2025    3:10 AM 09/11/2024   11:37 PM  CMP  Glucose 70 - 99 mg/dL 92  884  833   BUN 6 - 20 mg/dL 42  35  23   Creatinine 0.61 - 1.24 mg/dL 7.99  8.45  8.72   Sodium 135 - 145 mmol/L 139  140  139   Potassium 3.5 - 5.1 mmol/L 4.1  4.0  3.9   Chloride 98 - 111 mmol/L 105  103  108   CO2 22 - 32 mmol/L 25  21  19    Calcium  8.9 - 10.3 mg/dL 9.1  89.8  9.2   Total Protein 6.5 - 8.1 g/dL 6.7  8.3    Total  Bilirubin 0.0 - 1.2 mg/dL 0.9  0.9    Alkaline Phos 38 - 126 U/L 74  112    AST 15 - 41 U/L 19  32    ALT 0 - 44 U/L 17  27      No results found.  ASSESSMENT/PLAN:  Assessment: Principal Problem:   Hematemesis Active Problems:   Heart transplant status (HCC)   OCD (obsessive compulsive disorder)   Intractable vomiting   Chronic diarrhea   GERD (gastroesophageal reflux disease)   CKD (chronic kidney disease), stage II  Plan: Hematemesis Suspected upper GI bleed Recurrent nausea and vomiting Remains hemodynamically stable without return of hematemesis.  Nausea is well-controlled with Phenergan .  Gastroenterology performing EGD this morning. Repeat CBC this morning showed stable hemoglobin at 14, awaiting EGD results. - continue IV pantoprazole  40 mg daily twice daily, plan to discharge on pantoprazole  40 mg twice daily - Phenergan  12.5 mg every 6 hours as needed and Zofran  4 mg every 8 hours as needed  CKD stage II vs IIIb AKI Most recent baseline creatinine shows a GFR above 60 but a lot of his recent values showed GFR in the 50s, today GFR of 48 down from 55  yesterday and BUN 42 up from 35.  Creatinine elevated to 2.0 from 1.54, can be sequelae from severe vomiting and n.p.o. status for procedure, will give patient IVF this afternoon following procedure and repeat BMP, expecting labs to show improvement and he can discharge home with good follow-up.  - Repeat metabolic panel in the afternoon - Monitor urine output   Dilated cardiomyopathy s/p heart transplant complicated by chronic allograft vasculopathy Due to some GI upset some of his immunosuppressants been changed over the last several months.  He is very aware of his medicines and we will continue his current meds.  ED physician spoke to Dr. Quintin with [facility] cardiology who did not recommend any changes to his immunosuppressive meds.  Recently underwent echocardiogram and heart cath with preserved LVEF and stable allograft vasculopathy with negative ischemic eval and normal filling pressures. - EKG demonstrated sinus tachycardia, no acute concerns at this time - Continue prednisone  5 mg daily, tacrolimus  2 mg in the morning and 3 mg in the evening, and pantoprazole  as above - Hold aspirin and if CBC stable and EGD without significant abnormalities can resume  Chronic diarrhea At home he uses guar gum with good benefit and when he gets some bloating uses Gas-X with some benefit.  While on a clear liquid diet and preparing for EGD we will hold off on thickening agents and watch for any GI bleeding as above.  Plan to resume guar gum at home.  Best Practice: Diet: NPO IVF: Fluids: LR, Rate: 1000 cc bolus VTE: SCDs Start: 01/05/25 0846 Code: Full  Disposition planning: Therapy Recs: None, DME: none Family Contact: Spouse, to be notified. DISPO: Anticipated discharge today to Home pending EGD results and AKI, can discharge if he has good follow-up and is comfortable.  Signature: Alfornia Light, DO [facility]  10:13 AM, 01/06/2025  On Call pager [phone]  "

## 2025-01-06 NOTE — Op Note (Signed)
 [facility] Patient Name: Shawn Calderon Procedure Date : 01/06/2025 MRN: [id number] Attending MD: Belvie Just , MD, [id number] Date of Birth: 02/28/1976 CSN: [id number] Age: 49 Admit Type: Inpatient Procedure:                Upper GI endoscopy Indications:              Hematemesis Providers:                Belvie Just, MD, Collene Edu, RN, Joya Bunnell,                            Technician Referring MD:              Medicines:                Propofol  per Anesthesia Complications:            No immediate complications. Estimated Blood Loss:     Estimated blood loss: none. Procedure:                Pre-Anesthesia Assessment:                           - Prior to the procedure, a History and Physical                            was performed, and patient medications and                            allergies were reviewed. The patient's tolerance of                            previous anesthesia was also reviewed. The risks                            and benefits of the procedure and the sedation                            options and risks were discussed with the patient.                            All questions were answered, and informed consent                            was obtained. Prior Anticoagulants: The patient has                            taken no anticoagulant or antiplatelet agents. ASA                            Grade Assessment: III - A patient with severe                            systemic disease. After reviewing the risks and  benefits, the patient was deemed in satisfactory                            condition to undergo the procedure.                           - Sedation was administered by an anesthesia                            professional. Deep sedation was attained.                           After obtaining informed consent, the endoscope was                            passed under direct vision. Throughout the                             procedure, the patient's blood pressure, pulse, and                            oxygen saturations were monitored continuously. The                            [id number]-H190 ([id number]) Olympus endoscope was introduced                            through the mouth, and advanced to the second part                            of duodenum. The upper GI endoscopy was                            accomplished without difficulty. The patient                            tolerated the procedure well. Scope In: Scope Out: Findings:      A mild Schatzki ring was found at the gastroesophageal junction.      A 2 cm hiatal hernia was present.      The stomach was normal.      The examined duodenum was normal. Impression:               - Mild Schatzki ring.                           - 2 cm hiatal hernia.                           - Normal stomach.                           - Normal examined duodenum.                           - No specimens collected. Recommendation:           -  Return patient to hospital ward for ongoing care.                           - Resume regular diet.                           - Continue present medications. Procedure Code(s):        --- Professional ---                           [id number], Esophagogastroduodenoscopy, flexible,                            transoral; diagnostic, including collection of                            specimen(s) by brushing or washing, when performed                            (separate procedure) Diagnosis Code(s):        --- Professional ---                           K22.2, Esophageal obstruction                           K44.9, Diaphragmatic hernia without obstruction or                            gangrene                           K92.0, Hematemesis CPT copyright 2022 [employer]. All rights reserved. The codes documented in this report are preliminary and upon coder review may  be revised to meet current compliance  requirements. Belvie Just, MD Belvie Just, MD 01/06/2025 5:30:18 PM This report has been signed electronically. Number of Addenda: 0

## 2025-01-06 NOTE — Discharge Summary (Signed)
 "  Name: Shawn Calderon MRN: [id number] DOB: 16-Dec-1975 49 y.o. PCP: Paseda, Folashade R, FNP  Date of Admission: 01/05/2025  3:07 AM Date of Discharge: 01/06/2025 Attending Physician: Dr. MICAEL Riis Winfrey  Discharge Diagnosis: 1. Principal Problem:   Hematemesis Active Problems:   Heart transplant status (HCC)   OCD (obsessive compulsive disorder)   Intractable vomiting   Chronic diarrhea   GERD (gastroesophageal reflux disease)   CKD (chronic kidney disease), stage II  Discharge Medications: Allergies as of 01/06/2025       Reactions   Sulfamethoxazole Hives, Rash   Sulfa Antibiotics Hives   Amoxicillin -pot Clavulanate Diarrhea   Clindamycin Other (See Comments)   Heartburn   Reglan [metoclopramide]    Unknown reaction    Sirolimus Other (See Comments)   GI distress   Compazine [prochlorperazine] Anxiety        Medication List     TAKE these medications    acetaminophen  500 MG tablet Commonly known as: TYLENOL  Take 500-1,000 mg by mouth every 6 (six) hours as needed for moderate pain (pain score 4-6).   aspirin EC 81 MG tablet Take 81 mg by mouth daily.   citalopram  40 MG tablet Commonly known as: CELEXA  Take 40 mg by mouth daily.   diazepam  5 MG tablet Commonly known as: VALIUM  Take 5 mg by mouth 3 (three) times daily.   Eszopiclone 3 MG Tabs Take 3 mg by mouth at bedtime.   magnesium  oxide 400 MG tablet Commonly known as: MAG-OX Take 400 mg by mouth daily.   multivitamin with minerals Tabs tablet Take 1 tablet by mouth daily.   ondansetron  8 MG tablet Commonly known as: ZOFRAN  Take 8 mg by mouth every 8 (eight) hours as needed for nausea or vomiting.   OSCAL 500 D-3 PO Take 1 tablet by mouth daily.   pantoprazole  40 MG tablet Commonly known as: PROTONIX  Take 40 mg by mouth 2 (two) times daily.   predniSONE  5 MG tablet Commonly known as: DELTASONE  Take 5 mg by mouth daily with breakfast.   rosuvastatin  40 MG tablet Commonly known  as: CRESTOR  Take 40 mg by mouth daily.   tacrolimus  1 MG capsule Commonly known as: PROGRAF  Take 2-3 mg by mouth See admin instructions. Take 2mg  (2 capsules) ny mouth every morning and 3mg  (3 capsules) at bedtime.   topiramate  100 MG tablet Commonly known as: TOPAMAX  Take 100 mg by mouth daily.       Disposition and follow-up:   Shawn Calderon was discharged from [facility] in Good condition.  At the hospital follow up visit please address:  1.  Mild AKI in the setting of volume depletion 2/2 emesis, please recheck kidney function  2.  Labs / imaging needed at time of follow-up: n/a  3.  Pending labs/ test needing follow-up: n/a  Follow-up Appointments: We have arranged for you to have a hospital follow up with [facility] Internal [facility] Course by problem list: Shawn Calderon is a 49 y.o. 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  Patient said that  he developed nausea and vomiting, then vomited about 2 or 3 times before he started noticing bright red blood in his vomit.  Patient presented to the ED hemodynamically stable without tachycardia or hypotension, initial lab work showing stable hemoglobin of 16.6, platelets 222, creatinine 1.54, BUN, 35 and lipase of 71.  CT abdomen pelvis showed fluid-filled stomach, small bowel and colon without obstruction consistent with acute gastroenteritis/diarrhea.  Gastroenterology was consulted in the emergency department and recommended  scheduling EGD.  Patient received 1 L lactated Ringer 's bolus, Phenergan  and changed his oral PPI to IV pantoprazole .  Patient was monitored on telemetry and CBC recheck in the next day which showed stability of his hemoglobin levels. EGD performed which showed no acute concerns for his hematemesis.   Dilated cardiomyopathy s/p heart transplant complicated by chronic allograft vasculopathy Secondary to his GI upset some of his immunosuppressants been changed over the last several months. ED physician spoke to Dr. Quintin with [facility] cardiology who did not recommend any changes to his immunosuppressive meds.  Recently underwent echocardiogram and heart cath with preserved LVEF and stable allograft vasculopathy with negative ischemic eval and normal filling pressures.  He resumed his home prednisone  5 mg daily, tacro stainings in the morning and 20 mg at night, as well as his pantoprazole .  We monitor patient on continuous telemetry during this hospitalization.  We held this patient's aspirin in the setting of acute unspecified bleed, and resumed this on discharge.   CKD stage II vs IIIb AKI This patient's most recent baseline creatinine shows a GFR above 60 but several recent values showed GFR in the 50s.  Creatinine elevated to 1.54 on admission and 2.00 the next day, likely secondary to his severe vomiting.  He received IV fluids, we monitored his blood pressure and ensured he has good outpatient follow up to recheck his kidney function.    Subjective Patient doing well and stable following procedure. No acute concerns for hematemesis and no changes to his regimen, follow up with his specialists as scheduled. We have arranged for hospital follow up in out clinic.  Discharge Exam:   BP 114/73 (BP Location: Right Arm)   Pulse 87   Temp 99.6 F (37.6 C)   Resp 18   Ht 5' 10 (1.778 m)   Wt 184 lb 4.9 oz (83.6 kg)   SpO2 93%   BMI 26.44 kg/m  Discharge exam:  General: Pleasant appearing middle-age  male. In no acute distress Cardio: Regular rate and rhythm Pulm: Clear to auscultation bilaterally. Normal work of breathing on room air  Pertinent Labs, Studies, and Procedures:     Latest Ref Rng & Units 01/06/2025    7:03 AM 01/05/2025    3:10 AM 09/11/2024   11:37 PM  CBC  WBC 4.0 - 10.5 K/uL 10.0  9.3  7.8   Hemoglobin 13.0 - 17.0 g/dL 85.8  83.3  86.7   Hematocrit 39.0 - 52.0 % 41.8  50.0  40.1   Platelets 150 - 400 K/uL 176  222  205        Latest Ref Rng & Units 01/06/2025    7:03 AM 01/05/2025    3:10 AM 09/11/2024   11:37 PM  CMP  Glucose 70 - 99 mg/dL 92  884  833   BUN 6 - 20 mg/dL 42  35  23   Creatinine 0.61 - 1.24 mg/dL 7.99  8.45  8.72   Sodium 135 - 145 mmol/L 139  140  139  Potassium 3.5 - 5.1 mmol/L 4.1  4.0  3.9   Chloride 98 - 111 mmol/L 105  103  108   CO2 22 - 32 mmol/L 25  21  19    Calcium  8.9 - 10.3 mg/dL 9.1  89.8  9.2   Total Protein 6.5 - 8.1 g/dL 6.7  8.3    Total Bilirubin 0.0 - 1.2 mg/dL 0.9  0.9    Alkaline Phos 38 - 126 U/L 74  112    AST 15 - 41 U/L 19  32    ALT 0 - 44 U/L 17  27      CT ABDOMEN PELVIS W CONTRAST Result Date: 01/05/2025 EXAM: CT ABDOMEN AND PELVIS WITH CONTRAST 01/05/2025 05:22:30 AM TECHNIQUE: CT of the abdomen and pelvis was performed with the administration of 75 mL of iohexol  (OMNIPAQUE ) 350 MG/ML injection. Multiplanar reformatted images are provided for review. Automated exposure control, iterative reconstruction, and/or weight-based adjustment of the mA/kV was utilized to reduce the radiation dose to as low as reasonably achievable. COMPARISON: Non-contrast CT abdomen and pelvis 09/12/2024 and CT abdomen and pelvis with contrast 05/01/2020. CLINICAL HISTORY: 49 year old male with vomiting blood since 1900 hours, remote history of heart transplant, and acute, nonlocalized abdominal pain. FINDINGS: LOWER CHEST: Heart size stable since 2021 and no pericardial effusion. Partially visible sternotomy. Mildly lower lung volumes,  mild lung base atelectasis. No pleural effusion. LIVER: Small but circumscribed low density areas in the liver such as series 3 image 14 are chronic and most likely benign cysts or hemangiomas (no follow up imaging recommended). GALLBLADDER AND BILE DUCTS: Gallbladder is unremarkable. No biliary ductal dilatation. SPLEEN: No acute abnormality. PANCREAS: Partial pancreatic atrophy. ADRENAL GLANDS: No acute abnormality. KIDNEYS, URETERS AND BLADDER: Benign exophytic right renal cyst (no follow up imaging recommended). Mild degree of renal atrophy is suspected. Symmetric renal enhancement and early contrast excretion. No stones in the kidneys or ureters. No hydronephrosis. No perinephric or periureteral stranding. Urinary bladder is unremarkable. GI AND BOWEL: Small chronic gastrocaital hernia also stable. Fluid in the large bowel to the level of the rectum. Fluid throughout the small bowel. Retained fluid also in the stomach. No significantly dilated loops. Appendix not identified, diminutive or absent. Diverticulosis of the descending and the sigmoid colon. No discrete bowel wall thickening or mesenteric inflammation. PERITONEUM AND RETROPERITONEUM: No ascites. No free air. VASCULATURE: Major arterial structures and portal venous system appear patent and enhancing with no significant atherosclerosis identified. Aorta is normal in caliber. LYMPH NODES: No lymphadenopathy. REPRODUCTIVE ORGANS: Right testis now marginally located in the distal inguinal canal (series 3 image 95), probably retractile testicle. Superimposed small fat containing right inguinal hernia is stable with no complicating features. 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 No acute osseous abnormality. No focal soft tissue abnormality. IMPRESSION: 1. Fluid-filled stomach, small bowel, and colon without obstruction, suggesting acute gastroenteritis/diarrheal illness. 2. No other acute abnormality in  the abdomen or pelvis. Electronically signed by: Helayne Hurst MD 01/05/2025 06:02 AM EST RP Workstation: [id number]   Signed: Idelle Nakai, DO 01/06/2025, 6:25 PM   "

## 2025-01-06 NOTE — Anesthesia Preprocedure Evaluation (Signed)
 "                                  Anesthesia Evaluation  Patient identified by MRN, date of birth, ID band Patient awake    Reviewed: Allergy & Precautions, NPO status , Patient's Chart, lab work & pertinent test results  History of Anesthesia Complications Negative for: history of anesthetic complications  Airway Mallampati: II  TM Distance: >3 FB Neck ROM: Full  Mouth opening: Limited Mouth Opening  Dental  (+) Teeth Intact, Dental Advisory Given   Pulmonary neg shortness of breath, neg sleep apnea, neg COPD, neg recent URI   breath sounds clear to auscultation       Cardiovascular hypertension, +CHF  + Valvular Problems/Murmurs AI  Rhythm:Regular  S/p heart transplant   Neuro/Psych  PSYCHIATRIC DISORDERS Anxiety     negative neurological ROS     GI/Hepatic ,GERD  Medicated,,? GI bleed   Endo/Other  negative endocrine ROS    Renal/GU Renal InsufficiencyRenal diseaseLab Results      Component                Value               Date                                K                        3.9                 12/30/2023                CO2                      21 (L)              12/30/2023                BUN                      27 (H)              12/30/2023                CREATININE               1.16                12/30/2023                        Musculoskeletal   Abdominal   Peds  Hematology Lab Results      Component                Value               Date                      WBC                      10.0                01/06/2025                HGB  14.1                01/06/2025                HCT                      41.8                01/06/2025                MCV                      91.1                01/06/2025                PLT                      176                 01/06/2025              Anesthesia Other Findings ALL: see list  Reproductive/Obstetrics                               Anesthesia Physical Anesthesia Plan  ASA: 3  Anesthesia Plan: General   Post-op Pain Management:    Induction: Intravenous  PONV Risk Score and Plan: Propofol  infusion and Treatment may vary due to age or medical condition  Airway Management Planned: Nasal Cannula, Natural Airway and Simple Face Mask  Additional Equipment: None  Intra-op Plan:   Post-operative Plan:   Informed Consent: I have reviewed the patients History and Physical, chart, labs and discussed the procedure including the risks, benefits and alternatives for the proposed anesthesia with the patient or authorized representative who has indicated his/her understanding and acceptance.     Dental advisory given  Plan Discussed with: CRNA  Anesthesia Plan Comments: (See PAT note 12/30/2023)        Anesthesia Quick Evaluation  "

## 2025-01-06 NOTE — Discharge Instructions (Addendum)
 Thank you for allowing us  to be part of your care. You were hospitalized for hematemesis. We treated you with volume repletion, antiemetics and an EGD.  FOLLOW UP APPOINTMENTS: Please visit the [facility] at [address], [place], [place] [address] for your hospital follow-up.  Please go to your cardiology and gastroenterology appointments as scheduled.   Please call your PCP or our clinic if you have any questions or concerns, we may be able to help and keep you from a long and expensive emergency room wait. Our clinic and after hours phone number is [phone]. The best time to call is Monday through Friday 9 am to 4 pm but there is always someone available 24/7 if you have an emergency. If you need medication refills please notify your pharmacy one week in advance and they will send us  a request.   We are glad you are feeling better,  Alfornia Light, DO Internal Medicine Inpatient Teaching Service at [facility]

## 2025-01-06 NOTE — Anesthesia Postprocedure Evaluation (Signed)
"   Anesthesia Post Note  Patient: Shawn Calderon  Procedure(s) Performed: EGD (ESOPHAGOGASTRODUODENOSCOPY)     Patient location during evaluation: Endoscopy Anesthesia Type: MAC Level of consciousness: awake and alert Pain management: pain level controlled Vital Signs Assessment: post-procedure vital signs reviewed and stable Respiratory status: spontaneous breathing, nonlabored ventilation and respiratory function stable Cardiovascular status: stable and blood pressure returned to baseline Postop Assessment: no apparent nausea or vomiting Anesthetic complications: no   No notable events documented.  Last Vitals:  Vitals:   01/06/25 0915 01/06/25 1732  BP: 112/77 114/73  Pulse:  87  Resp:  18  Temp: 36.9 C 37.6 C  SpO2: 95% 93%    Last Pain:  Vitals:   01/06/25 1732  TempSrc:   PainSc: 0-No pain                 [name]      "

## 2025-01-06 NOTE — Transfer of Care (Signed)
 Immediate Anesthesia Transfer of Care Note  Patient: Shawn Calderon  Procedure(s) Performed: EGD (ESOPHAGOGASTRODUODENOSCOPY)  Patient Location: PACU  Anesthesia Type:MAC  Level of Consciousness: awake, alert , and oriented  Airway & Oxygen Therapy: Patient Spontanous Breathing  Post-op Assessment: Report given to RN and Post -op Vital signs reviewed and stable  Post vital signs: Reviewed and stable  Last Vitals:  Vitals Value Taken Time  BP 114/73   Temp    Pulse 85 bpm   Resp 14 rpm   SpO2 93%     Last Pain:  Vitals:   01/06/25 0915  TempSrc: Oral  PainSc:          Complications: No notable events documented.

## 2025-01-07 ENCOUNTER — Encounter (HOSPITAL_COMMUNITY): Payer: Self-pay | Admitting: Gastroenterology

## 2025-01-08 ENCOUNTER — Telehealth: Payer: Self-pay

## 2025-01-08 ENCOUNTER — Telehealth: Payer: Self-pay | Admitting: Nurse Practitioner

## 2025-01-08 NOTE — Telephone Encounter (Signed)
 Copied from CRM #[id number]. Topic: General - Other >> Jan 08, 2025  4:39 PM Hadassah PARAS wrote: Reason for CRM: Pt returning missed call from [place], Julian, LPN. Please call pt back on #[phone]

## 2025-01-08 NOTE — Transitions of Care (Post Inpatient/ED Visit) (Unsigned)
" ° °  01/08/2025  Name: Shawn Calderon MRN: [id number] DOB: [birth year]  Today's TOC FU Call Status: Today's TOC FU Call Status:: Unsuccessful Call (1st Attempt) Unsuccessful Call (1st Attempt) Date: 01/08/25  Attempted to reach the patient regarding the most recent Inpatient/ED visit.  Follow Up Plan: Additional outreach attempts will be made to reach the patient to complete the Transitions of Care (Post Inpatient/ED visit) call.   Signature Julian Lemmings, LPN [facility] Nurse Health Advisor Direct Dial [phone]  "

## 2025-01-09 NOTE — Transitions of Care (Post Inpatient/ED Visit) (Signed)
 "  01/09/2025  Name: Shawn Calderon MRN: [id number] DOB: [birth year]  Today's TOC FU Call Status: Today's TOC FU Call Status:: Successful TOC FU Call Completed Unsuccessful Call (1st Attempt) Date: 01/08/25 [facility] FU Call Complete Date: 01/09/25  Patient's Name and Date of Birth confirmed. Name, DOB  Transition Care Management Follow-up Telephone Call Date of Discharge: 01/06/25 Discharge Facility: [facility]) Type of Discharge: Inpatient Admission Primary Inpatient Discharge Diagnosis:: hematemesis How have you been since you were released from the hospital?: Better Any questions or concerns?: No  Items Reviewed: Did you receive and understand the discharge instructions provided?: Yes Medications obtained,verified, and reconciled?: Yes (Medications Reviewed) Any new allergies since your discharge?: No Dietary orders reviewed?: Yes Do you have support at home?: Yes People in Home [RPT]: spouse  Medications Reviewed Today: Medications Reviewed Today     Reviewed by Emmitt Pan, LPN (Licensed Practical Nurse) on 01/09/25 at 1117  Med List Status: <None>   Medication Order Taking? Sig Documenting Provider Last Dose Status Informant  acetaminophen  (TYLENOL ) 500 MG tablet [id number] Yes Take 500-1,000 mg by mouth every 6 (six) hours as needed for moderate pain (pain score 4-6). [provider]  Active Self, Spouse/Significant Other  aspirin EC 81 MG tablet [id number] Yes Take 81 mg by mouth daily. [provider]  Active Self, Spouse/Significant Other  Calcium  Carbonate-Vitamin D (OSCAL 500 D-3 PO) [id number] Yes Take 1 tablet by mouth daily. [provider]  Active Self, Spouse/Significant Other  citalopram  (CELEXA ) 40 MG tablet [id number] Yes Take 40 mg by mouth daily. [provider]  Active Self, Spouse/Significant Other  diazepam  (VALIUM ) 5 MG tablet [id number] Yes Take 5 mg by mouth 3 (three) times daily. [provider]  Active  Self, Spouse/Significant Other  Eszopiclone 3 MG TABS [id number] Yes Take 3 mg by mouth at bedtime. [provider]  Active Self, Spouse/Significant Other  magnesium  oxide (MAG-OX) 400 MG tablet [id number] Yes Take 400 mg by mouth daily. [provider]  Active Self, Spouse/Significant Other  Multiple Vitamin (MULTIVITAMIN WITH MINERALS) TABS tablet [id number] Yes Take 1 tablet by mouth daily. [provider]  Active Self, Spouse/Significant Other  ondansetron  (ZOFRAN ) 8 MG tablet [id number] Yes Take 8 mg by mouth every 8 (eight) hours as needed for nausea or vomiting. [provider]  Active Self, Spouse/Significant Other  pantoprazole  (PROTONIX ) 40 MG tablet [id number] Yes Take 40 mg by mouth 2 (two) times daily. [provider]  Active Self, Spouse/Significant Other  predniSONE  (DELTASONE ) 5 MG tablet [id number] Yes Take 5 mg by mouth daily with breakfast. [provider]  Active Self, Spouse/Significant Other  rosuvastatin  (CRESTOR ) 40 MG tablet [id number] Yes Take 40 mg by mouth daily. [provider]  Active Self, Spouse/Significant Other  tacrolimus  (PROGRAF ) 1 MG capsule [id number] Yes Take 2-3 mg by mouth See admin instructions. Take 2mg  (2 capsules) ny mouth every morning and 3mg  (3 capsules) at bedtime. [provider]  Active Self, Spouse/Significant Other  topiramate  (TOPAMAX ) 100 MG tablet [id number] Yes Take 100 mg by mouth daily. [provider]  Active Self, Spouse/Significant Other            Home Care and Equipment/Supplies: Were Home Health Services Ordered?: NA Any new equipment or medical supplies ordered?: NA  Functional Questionnaire: Do you need assistance with bathing/showering or dressing?: No Do you need assistance with meal preparation?: No Do you need assistance with eating?: No Do you have difficulty maintaining continence: No Do you need  assistance with getting out of bed/getting out of a  chair/moving?: No Do you have difficulty managing or taking your medications?: No  Follow up appointments reviewed: PCP Follow-up appointment confirmed?: Yes Date of PCP follow-up appointment?: 01/12/25 Follow-up Provider: [facility] Follow-up appointment confirmed?: No Reason Specialist Follow-Up Not Confirmed: Patient has Specialist Provider Number and will Call for Appointment Do you need transportation to your follow-up appointment?: No Do you understand care options if your condition(s) worsen?: Yes-patient verbalized understanding    SIGNATURE Julian Lemmings, LPN [facility] Nurse Health Advisor Direct Dial [phone]  "

## 2025-01-12 ENCOUNTER — Ambulatory Visit: Payer: Self-pay | Admitting: Nurse Practitioner

## 2025-01-12 ENCOUNTER — Encounter: Payer: Self-pay | Admitting: Nurse Practitioner

## 2025-01-12 VITALS — BP 96/62 | HR 76 | Wt 194.8 lb

## 2025-01-12 DIAGNOSIS — F419 Anxiety disorder, unspecified: Secondary | ICD-10-CM

## 2025-01-12 DIAGNOSIS — Z941 Heart transplant status: Secondary | ICD-10-CM

## 2025-01-12 DIAGNOSIS — N182 Chronic kidney disease, stage 2 (mild): Secondary | ICD-10-CM

## 2025-01-12 DIAGNOSIS — E785 Hyperlipidemia, unspecified: Secondary | ICD-10-CM

## 2025-01-12 DIAGNOSIS — K219 Gastro-esophageal reflux disease without esophagitis: Secondary | ICD-10-CM

## 2025-01-12 DIAGNOSIS — Z09 Encounter for follow-up examination after completed treatment for conditions other than malignant neoplasm: Secondary | ICD-10-CM | POA: Insufficient documentation

## 2025-01-12 DIAGNOSIS — R112 Nausea with vomiting, unspecified: Secondary | ICD-10-CM | POA: Insufficient documentation

## 2025-01-12 DIAGNOSIS — K920 Hematemesis: Secondary | ICD-10-CM

## 2025-01-12 MED ORDER — PROCHLORPERAZINE MALEATE 5 MG PO TABS: 5.0000 mg | ORAL_TABLET | Freq: Four times a day (QID) | ORAL | 0 refills | Status: AC | PRN

## 2025-01-12 NOTE — Assessment & Plan Note (Signed)
 Continue prednisone , tacrolimus , aspirin 81 mg daily Maintain close follow-up with the transplant team update

## 2025-01-12 NOTE — Assessment & Plan Note (Signed)
 Continue rosuvastatin  40 mg daily

## 2025-01-12 NOTE — Assessment & Plan Note (Signed)
 Hospital chart reviewed, including discharge summary Medications reconciled and reviewed with the patient in detail

## 2025-01-12 NOTE — Assessment & Plan Note (Addendum)
 Lab Results  Component Value Date   NA 139 01/06/2025   K 4.1 01/06/2025   CO2 25 01/06/2025   GLUCOSE 92 01/06/2025   BUN 42 (H) 01/06/2025   CREATININE 2.00 (H) 01/06/2025   CALCIUM  9.1 01/06/2025   GFR 41.75 (L) 02/24/2023   GFRNONAA 40 (L) 01/06/2025  Chronic kidney disease, stage 2 Recent dehydration led to decreased kidney function. Current creatinine 1.54, eGFR 40. - Ensure hydration with at least 64 ounces of water daily. - Avoid NSAIDs to prevent further decline. Plans to have labs done at [facility]

## 2025-01-12 NOTE — Assessment & Plan Note (Signed)
"   Chronic nausea and vomiting Persistent nausea and vomiting with recent hematemesis. Previous EGD showed no acute concerns. Recent endoscopy suggested a slight tear and dumping syndrome.  Zofran  ineffective. Compazine  considered despite previous hypersensitivity, though he denies allergy. - Prescribed Compazine  5 mg every 6 hours as needed for nausea. - Maintain close follow-up with gastroenterology. "

## 2025-01-12 NOTE — Assessment & Plan Note (Signed)
 Gastroesophageal reflux disease (GERD) Managed with pantoprazole . - Continue pantoprazole  40 mg twice daily.

## 2025-01-12 NOTE — Patient Instructions (Addendum)
 1. CKD (chronic kidney disease), stage II (Primary) - Basic Metabolic Panel  2. Nausea and vomiting, unspecified vomiting type - prochlorperazine  (COMPAZINE ) 5 MG tablet; Take 1 tablet (5 mg total) by mouth every 6 (six) hours as needed for nausea or vomiting.  Dispense: 30 tablet; Refill: 0    It is important that you exercise regularly at least 30 minutes 5 times a week as tolerated  Think about what you will eat, plan ahead. Choose  clean, green, fresh or frozen over canned, processed or packaged foods which are more sugary, salty and fatty. 70 to 75% of food eaten should be vegetables and fruit. Three meals at set times with snacks allowed between meals, but they must be fruit or vegetables. Aim to eat over a 12 hour period , example 7 am to 7 pm, and STOP after  your last meal of the day. Drink water,generally about 64 ounces per day, no other drink is as healthy. Fruit juice is best enjoyed in a healthy way, by EATING the fruit.  Thanks for choosing [facility] we consider it a privelige to serve you.

## 2025-01-12 NOTE — Progress Notes (Signed)
 "  Established Patient Office Visit  Subjective:  Patient ID: Shawn Calderon, male    DOB: 1976/12/04  Age: 50 y.o. MRN: [id number]  CC:  Chief Complaint  Patient presents with   Hospitalization Follow-up    HPI   Discussed the use of AI scribe software for clinical note transcription with the patient, who gave verbal consent to proceed.  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  who presents with persistent nausea and gastrointestinal issues.  He has ongoing gastrointestinal issues, including nausea and episodes of hematemesis. He was recently hospitalized from 01/05/2025 to 01/06/2025 for hematemesis, recurrent nausea vomiting During the hospitalization, an EGD was performed, which did not reveal acute concerns for hematemesis. Stable Small chronic gastrocaital hernia noted,  No other acute abnormality in the abdomen or pelvis.  He has not experienced any recent episodes of hematemesis, but the nausea persists intermittently.Takes Zofran  at home. He is allergic to Reglan. He denied any allergy to Compazine  even though it is listed on his allergy list and stated he has never taken it.  He is followed by GI at [facility]    He has a history of transplant and is on immunosuppressive medication, including prednisone . He maintains hydration by drinking BCAAs with electrolytes and carries a large bottle of water while traveling for work.  He has been maintaining close follow-up with the transplant team at [facility], sees them next week Monday and will have labs done during that visit  No chest pain, shortness of breath, stomach pain, or constipation. He has received the pneumonia and shingles vaccines at [facility] and has had the tetanus vaccine.      Assessment & Plan       Past Medical History:  Diagnosis Date   Anxiety    CHF (congestive heart failure) (HCC)    Chronic kidney disease    GERD (gastroesophageal reflux disease)    Heart transplant recipient [facility]) 2008   Hypertension    MRSA infection    L arm   Pneumonia     Past Surgical History:  Procedure Laterality Date   APPENDECTOMY     COLONOSCOPY     ESOPHAGOGASTRODUODENOSCOPY N/A 01/06/2025   Procedure: EGD (ESOPHAGOGASTRODUODENOSCOPY);  Surgeon: Rollin Dover, MD;  Location: [facility] ENDOSCOPY;  Service: Gastroenterology;  Laterality: N/A;   heart transplant  2008   heart transplant recipient   HEART TRANSPLANT     INGUINAL HERNIA REPAIR Left 01/03/2024   Procedure: REPAIR LEFT INGUINAL HERNIA WITH MESH;  Surgeon: Vanderbilt Ned, MD;  Location: [facility] ORS;  Service: General;  Laterality: Left;  GENERAL & TAP BLOCK    Family History  Problem Relation Age of Onset   Diverticulosis Mother    Diverticulosis Father    Colon cancer Neg Hx    Pancreatic cancer Neg Hx    Esophageal cancer Neg Hx    Stomach cancer Neg Hx    Liver cancer Neg Hx    Rectal cancer Neg Hx     Social History   Socioeconomic History   Marital status: Married    Spouse name: Not on file   Number of children: 3   Years of education: Not on file   Highest education level: Bachelor's degree (e.g., BA, AB, BS)  Occupational History   Not on file  Tobacco Use   Smoking status: Never    Passive exposure: Never   Smokeless tobacco: Never  Vaping Use   Vaping status: Never Used  Substance and Sexual Activity   Alcohol use: Not Currently   Drug use: Not Currently   Sexual activity: Yes  Other Topics Concern   Not on file  Social History Narrative   Lives with his spouse   Social Drivers of Health   Tobacco Use: Low Risk (01/12/2025)   Patient History    Smoking Tobacco Use: Never    Smokeless Tobacco Use: Never    Passive Exposure: Never  Financial Resource Strain: Low Risk   (05/28/2024)   Received from [facility]   Overall Financial Resource Strain (CARDIA)    Difficulty of Paying Living Expenses: Not hard at all  Food Insecurity: No Food Insecurity (01/05/2025)   Epic    Worried About Radiation Protection Practitioner of Food in the Last Year: Never true    Ran Out of Food in the Last Year: Never true  Transportation Needs: No Transportation Needs (01/05/2025)   Epic    Lack of Transportation (Medical): No    Lack of Transportation (Non-Medical): No  Physical Activity: Sufficiently Active (12/13/2023)   Exercise Vital Sign    Days of Exercise per Week: 3 days    Minutes of Exercise per Session: 60 min  Stress: No Stress Concern Present (12/13/2023)   [employer] of Occupational Health - Occupational Stress Questionnaire    Feeling of Stress : Not at all  Social Connections: Moderately Isolated (01/05/2025)   Social Connection and Isolation Panel    Frequency of Communication with Friends and Family: Three times a week    Frequency of Social Gatherings with Friends and Family: Once a week    Attends Religious Services: Patient declined    Active Member of Clubs or Organizations: No    Attends Engineer, Structural: Not on file    Marital Status: Married  Catering Manager Violence: Not At Risk (01/05/2025)   Epic    Fear of Current or Ex-Partner: No    Emotionally Abused: No    Physically Abused: No    Sexually Abused: No  Depression (PHQ2-9): Low Risk (01/12/2025)   Depression (PHQ2-9)    PHQ-2 Score: 0  Alcohol Screen: Not on file  Housing: Low Risk (01/05/2025)   Epic    Unable to Pay for Housing in the Last Year: No    Number of Times Moved in the Last Year: 0    Homeless in the Last Year: No  Utilities: Not At Risk (01/05/2025)   Epic    Threatened with loss of utilities: No  Health Literacy: Not on file    Outpatient Medications Prior to Visit  Medication Sig Dispense Refill   acetaminophen  (TYLENOL ) 500 MG tablet Take 500-1,000  mg by mouth every 6 (six) hours as needed for moderate pain (pain score 4-6).     aspirin EC 81 MG tablet Take 81 mg by mouth daily.     Calcium  Carbonate-Vitamin D (OSCAL 500 D-3 PO) Take 1 tablet by mouth daily.     citalopram  (CELEXA ) 40 MG tablet Take 40 mg by mouth daily.     diazepam  (VALIUM ) 5 MG tablet Take 5 mg by mouth 3 (three) times daily.     Eszopiclone 3 MG TABS Take 3 mg by mouth at bedtime.     magnesium  oxide (MAG-OX) 400 MG tablet Take 400 mg by mouth daily.  Multiple Vitamin (MULTIVITAMIN WITH MINERALS) TABS tablet Take 1 tablet by mouth daily.     pantoprazole  (PROTONIX ) 40 MG tablet Take 40 mg by mouth 2 (two) times daily.     predniSONE  (DELTASONE ) 5 MG tablet Take 5 mg by mouth daily with breakfast.     rosuvastatin  (CRESTOR ) 40 MG tablet Take 40 mg by mouth daily.     tacrolimus  (PROGRAF ) 1 MG capsule Take 2-3 mg by mouth See admin instructions. Take 2mg  (2 capsules) ny mouth every morning and 3mg  (3 capsules) at bedtime.     topiramate  (TOPAMAX ) 100 MG tablet Take 100 mg by mouth daily.     ondansetron  (ZOFRAN ) 8 MG tablet Take 8 mg by mouth every 8 (eight) hours as needed for nausea or vomiting. (Patient not taking: Reported on 01/12/2025)     No facility-administered medications prior to visit.    Allergies[1]  ROS Review of Systems  Constitutional:  Negative for appetite change, chills, fatigue and fever.  HENT:  Negative for congestion, postnasal drip, rhinorrhea and sneezing.   Respiratory:  Negative for cough, shortness of breath and wheezing.   Cardiovascular:  Negative for chest pain, palpitations and leg swelling.  Gastrointestinal:  Positive for nausea. Negative for abdominal pain, constipation and vomiting.  Genitourinary:  Negative for difficulty urinating, dysuria, flank pain and frequency.  Musculoskeletal:  Negative for arthralgias, back pain, joint swelling and myalgias.  Skin:  Negative for color change, pallor, rash and wound.  Neurological:   Negative for dizziness, facial asymmetry, weakness, numbness and headaches.  Psychiatric/Behavioral:  Negative for behavioral problems, confusion, self-injury and suicidal ideas.       Objective:    Physical Exam Vitals and nursing note reviewed.  Constitutional:      General: He is not in acute distress.    Appearance: Normal appearance. He is not ill-appearing, toxic-appearing or diaphoretic.  Eyes:     General: No scleral icterus.       Right eye: No discharge.        Left eye: No discharge.     Extraocular Movements: Extraocular movements intact.     Conjunctiva/sclera: Conjunctivae normal.  Cardiovascular:     Rate and Rhythm: Normal rate and regular rhythm.     Pulses: Normal pulses.     Heart sounds: Normal heart sounds. No murmur heard.    No friction rub. No gallop.  Pulmonary:     Effort: Pulmonary effort is normal. No respiratory distress.     Breath sounds: Normal breath sounds. No stridor. No wheezing, rhonchi or rales.  Chest:     Chest wall: No tenderness.  Abdominal:     General: There is no distension.     Palpations: Abdomen is soft.     Tenderness: There is no abdominal tenderness. There is no right CVA tenderness, left CVA tenderness or guarding.  Musculoskeletal:        General: No swelling, tenderness, deformity or signs of injury.     Right lower leg: No edema.     Left lower leg: No edema.  Skin:    General: Skin is warm and dry.     Capillary Refill: Capillary refill takes less than 2 seconds.     Coloration: Skin is not jaundiced or pale.     Findings: No bruising, erythema or lesion.  Neurological:     Mental Status: He is alert and oriented to person, place, and time.     Motor: No weakness.     Coordination:  Coordination normal.     Gait: Gait normal.  Psychiatric:        Mood and Affect: Mood normal.        Behavior: Behavior normal.        Thought Content: Thought content normal.        Judgment: Judgment normal.     BP 96/62    Pulse 76   Wt 194 lb 12.8 oz (88.4 kg)   SpO2 96%   BMI 27.95 kg/m  Wt Readings from Last 3 Encounters:  01/12/25 194 lb 12.8 oz (88.4 kg)  01/06/25 184 lb 4.9 oz (83.6 kg)  09/11/24 185 lb (83.9 kg)    No results found for: TSH Lab Results  Component Value Date   WBC 10.0 01/06/2025   HGB 14.1 01/06/2025   HCT 41.8 01/06/2025   MCV 91.1 01/06/2025   PLT 176 01/06/2025   Lab Results  Component Value Date   NA 139 01/06/2025   K 4.1 01/06/2025   CO2 25 01/06/2025   GLUCOSE 92 01/06/2025   BUN 42 (H) 01/06/2025   CREATININE 2.00 (H) 01/06/2025   BILITOT 0.9 01/06/2025   ALKPHOS 74 01/06/2025   AST 19 01/06/2025   ALT 17 01/06/2025   PROT 6.7 01/06/2025   ALBUMIN 3.7 01/06/2025   CALCIUM  9.1 01/06/2025   ANIONGAP 9 01/06/2025   GFR 41.75 (L) 02/24/2023   No results found for: CHOL No results found for: HDL No results found for: LDLCALC No results found for: TRIG No results found for: CHOLHDL No results found for: YHAJ8R    Assessment & Plan:   Problem List Items Addressed This Visit       Digestive   GERD (gastroesophageal reflux disease)   Gastroesophageal reflux disease (GERD) Managed with pantoprazole . - Continue pantoprazole  40 mg twice daily.        Hematemesis - Primary   Now resolved Maintain close follow-[facility]      Nausea and vomiting    Chronic nausea and vomiting Persistent nausea and vomiting with recent hematemesis. Previous EGD showed no acute concerns. Recent endoscopy suggested a slight tear and dumping syndrome.  Zofran  ineffective. Compazine  considered despite previous hypersensitivity, though he denies allergy. - Prescribed Compazine  5 mg every 6 hours as needed for nausea. - Maintain close follow-up with gastroenterology.      Relevant Medications   prochlorperazine  (COMPAZINE ) 5 MG tablet     Genitourinary   CKD (chronic kidney disease), stage II   Lab Results  Component Value Date   NA 139  01/06/2025   K 4.1 01/06/2025   CO2 25 01/06/2025   GLUCOSE 92 01/06/2025   BUN 42 (H) 01/06/2025   CREATININE 2.00 (H) 01/06/2025   CALCIUM  9.1 01/06/2025   GFR 41.75 (L) 02/24/2023   GFRNONAA 40 (L) 01/06/2025  Chronic kidney disease, stage 2 Recent dehydration led to decreased kidney function. Current creatinine 1.54, eGFR 40. - Ensure hydration with at least 64 ounces of water daily. - Avoid NSAIDs to prevent further decline. Plans to have labs done at [facility]         Other   Heart transplant status (HCC)   Continue prednisone , tacrolimus , aspirin 81 mg daily Maintain close follow-up with the transplant team update      Hyperlipidemia   Continue rosuvastatin  40 mg daily      Anxiety   Continue Celexa  40 mg daily, diazepam  5 mg 3 times daily Medications managed by psychiatrist  Hospital discharge follow-up   Hospital chart reviewed, including discharge summary Medications reconciled and reviewed with the patient in detail        Meds ordered this encounter  Medications   prochlorperazine  (COMPAZINE ) 5 MG tablet    Sig: Take 1 tablet (5 mg total) by mouth every 6 (six) hours as needed for nausea or vomiting.    Dispense:  30 tablet    Refill:  0    Follow-up: Return in about 6 months (around 07/12/2025).    Iyla Balzarini R Tressa Maldonado, FNP     [1]  Allergies Allergen Reactions   Sulfamethoxazole Hives and Rash   Sulfa Antibiotics Hives   Amoxicillin -Pot Clavulanate Diarrhea   Clindamycin Other (See Comments)    Heartburn   Reglan [Metoclopramide]     Unknown reaction    Sirolimus Other (See Comments)    GI distress   Compazine  [Prochlorperazine ] Anxiety   "

## 2025-01-12 NOTE — Assessment & Plan Note (Signed)
 Continue Celexa  40 mg daily, diazepam  5 mg 3 times daily Medications managed by psychiatrist

## 2025-01-12 NOTE — Assessment & Plan Note (Signed)
 Now resolved Maintain close follow-up with [facility] at [facility]
